# Patient Record
Sex: Female | Born: 1935 | Race: White | Hispanic: No | Marital: Married | Smoking: Never smoker
Health system: Southern US, Community
[De-identification: ages and names within clinical notes are randomized; demographics above are authoritative.]

## PROBLEM LIST (undated history)

## (undated) DIAGNOSIS — N159 Renal tubulo-interstitial disease, unspecified: Secondary | ICD-10-CM

## (undated) DIAGNOSIS — H409 Unspecified glaucoma: Secondary | ICD-10-CM

## (undated) DIAGNOSIS — K5731 Diverticulosis of large intestine without perforation or abscess with bleeding: Secondary | ICD-10-CM

## (undated) DIAGNOSIS — K922 Gastrointestinal hemorrhage, unspecified: Secondary | ICD-10-CM

## (undated) DIAGNOSIS — H579 Unspecified disorder of eye and adnexa: Secondary | ICD-10-CM

## (undated) DIAGNOSIS — K219 Gastro-esophageal reflux disease without esophagitis: Secondary | ICD-10-CM

## (undated) DIAGNOSIS — I10 Essential (primary) hypertension: Secondary | ICD-10-CM

## (undated) DIAGNOSIS — F419 Anxiety disorder, unspecified: Secondary | ICD-10-CM

## (undated) HISTORY — PX: CATARACT EXTRACTION: SUR2

## (undated) HISTORY — PX: DILATION AND CURETTAGE OF UTERUS: SHX78

## (undated) HISTORY — PX: FOOT SURGERY: SHX648

## (undated) HISTORY — PX: EYE SURGERY: SHX253

## (undated) HISTORY — PX: OTHER SURGICAL HISTORY: SHX169

## (undated) HISTORY — DX: Diverticulosis of large intestine without perforation or abscess with bleeding: K57.31

## (undated) HISTORY — PX: TONSILLECTOMY: SUR1361

## (undated) HISTORY — PX: THYROID SURGERY: SHX805

## (undated) HISTORY — PX: HERNIA REPAIR: SHX51

---

## 2000-02-20 ENCOUNTER — Encounter: Admission: RE | Admit: 2000-02-20 | Discharge: 2000-02-20 | Payer: Self-pay | Admitting: Pulmonary Disease

## 2001-02-24 ENCOUNTER — Ambulatory Visit (HOSPITAL_COMMUNITY): Admission: RE | Admit: 2001-02-24 | Discharge: 2001-02-24 | Payer: Self-pay | Admitting: Pulmonary Disease

## 2001-03-05 ENCOUNTER — Ambulatory Visit (HOSPITAL_COMMUNITY): Admission: RE | Admit: 2001-03-05 | Discharge: 2001-03-05 | Payer: Self-pay | Admitting: Pulmonary Disease

## 2001-03-11 ENCOUNTER — Ambulatory Visit (HOSPITAL_COMMUNITY): Admission: RE | Admit: 2001-03-11 | Discharge: 2001-03-11 | Payer: Self-pay | Admitting: Ophthalmology

## 2001-03-11 ENCOUNTER — Observation Stay (HOSPITAL_COMMUNITY): Admission: EM | Admit: 2001-03-11 | Discharge: 2001-03-12 | Payer: Self-pay

## 2001-08-11 ENCOUNTER — Encounter: Payer: Self-pay | Admitting: Emergency Medicine

## 2001-08-11 ENCOUNTER — Emergency Department (HOSPITAL_COMMUNITY): Admission: EM | Admit: 2001-08-11 | Discharge: 2001-08-11 | Payer: Self-pay | Admitting: Emergency Medicine

## 2001-10-25 ENCOUNTER — Ambulatory Visit (HOSPITAL_COMMUNITY): Admission: RE | Admit: 2001-10-25 | Discharge: 2001-10-25 | Payer: Self-pay | Admitting: Pulmonary Disease

## 2001-11-27 ENCOUNTER — Emergency Department (HOSPITAL_COMMUNITY): Admission: EM | Admit: 2001-11-27 | Discharge: 2001-11-27 | Payer: Self-pay | Admitting: Internal Medicine

## 2002-02-25 ENCOUNTER — Ambulatory Visit (HOSPITAL_COMMUNITY): Admission: RE | Admit: 2002-02-25 | Discharge: 2002-02-25 | Payer: Self-pay | Admitting: Pulmonary Disease

## 2002-03-03 ENCOUNTER — Ambulatory Visit (HOSPITAL_COMMUNITY): Admission: RE | Admit: 2002-03-03 | Discharge: 2002-03-03 | Payer: Self-pay | Admitting: Pulmonary Disease

## 2002-05-30 ENCOUNTER — Other Ambulatory Visit: Admission: RE | Admit: 2002-05-30 | Discharge: 2002-05-30 | Payer: Self-pay | Admitting: Dermatology

## 2002-06-07 ENCOUNTER — Encounter: Admission: RE | Admit: 2002-06-07 | Discharge: 2002-06-07 | Payer: Self-pay | Admitting: Pulmonary Disease

## 2002-12-22 ENCOUNTER — Other Ambulatory Visit: Admission: RE | Admit: 2002-12-22 | Discharge: 2002-12-22 | Payer: Self-pay | Admitting: Dermatology

## 2002-12-27 ENCOUNTER — Encounter (HOSPITAL_COMMUNITY): Admission: RE | Admit: 2002-12-27 | Discharge: 2003-01-26 | Payer: Self-pay | Admitting: Orthopaedic Surgery

## 2003-06-20 ENCOUNTER — Ambulatory Visit (HOSPITAL_COMMUNITY): Admission: RE | Admit: 2003-06-20 | Discharge: 2003-06-20 | Payer: Self-pay | Admitting: Pulmonary Disease

## 2004-04-09 ENCOUNTER — Encounter: Admission: RE | Admit: 2004-04-09 | Discharge: 2004-04-09 | Payer: Self-pay | Admitting: Pulmonary Disease

## 2004-06-25 ENCOUNTER — Ambulatory Visit (HOSPITAL_COMMUNITY): Admission: RE | Admit: 2004-06-25 | Discharge: 2004-06-25 | Payer: Self-pay | Admitting: Pulmonary Disease

## 2004-07-22 ENCOUNTER — Other Ambulatory Visit: Admission: RE | Admit: 2004-07-22 | Discharge: 2004-07-22 | Payer: Self-pay | Admitting: Dermatology

## 2004-11-27 ENCOUNTER — Ambulatory Visit (HOSPITAL_COMMUNITY): Admission: RE | Admit: 2004-11-27 | Discharge: 2004-11-27 | Payer: Self-pay | Admitting: Pulmonary Disease

## 2005-05-30 ENCOUNTER — Ambulatory Visit: Payer: Self-pay | Admitting: Internal Medicine

## 2005-06-03 ENCOUNTER — Encounter (INDEPENDENT_AMBULATORY_CARE_PROVIDER_SITE_OTHER): Payer: Self-pay | Admitting: Internal Medicine

## 2005-06-03 ENCOUNTER — Ambulatory Visit: Payer: Self-pay | Admitting: Internal Medicine

## 2005-06-03 ENCOUNTER — Ambulatory Visit (HOSPITAL_COMMUNITY): Admission: RE | Admit: 2005-06-03 | Discharge: 2005-06-03 | Payer: Self-pay | Admitting: Internal Medicine

## 2005-06-19 ENCOUNTER — Ambulatory Visit: Payer: Self-pay | Admitting: Internal Medicine

## 2005-06-23 ENCOUNTER — Ambulatory Visit (HOSPITAL_COMMUNITY): Admission: RE | Admit: 2005-06-23 | Discharge: 2005-06-23 | Payer: Self-pay | Admitting: Internal Medicine

## 2005-08-11 ENCOUNTER — Ambulatory Visit: Payer: Self-pay | Admitting: Internal Medicine

## 2005-09-03 ENCOUNTER — Ambulatory Visit: Payer: Self-pay | Admitting: Internal Medicine

## 2005-10-30 ENCOUNTER — Ambulatory Visit (HOSPITAL_COMMUNITY): Admission: RE | Admit: 2005-10-30 | Discharge: 2005-10-30 | Payer: Self-pay | Admitting: Pulmonary Disease

## 2005-12-11 ENCOUNTER — Ambulatory Visit: Payer: Self-pay | Admitting: Internal Medicine

## 2006-11-05 ENCOUNTER — Ambulatory Visit (HOSPITAL_COMMUNITY): Admission: RE | Admit: 2006-11-05 | Discharge: 2006-11-05 | Payer: Self-pay | Admitting: Pulmonary Disease

## 2006-12-21 ENCOUNTER — Ambulatory Visit (HOSPITAL_COMMUNITY): Admission: RE | Admit: 2006-12-21 | Discharge: 2006-12-21 | Payer: Self-pay | Admitting: Pulmonary Disease

## 2007-09-28 ENCOUNTER — Ambulatory Visit (HOSPITAL_COMMUNITY): Admission: RE | Admit: 2007-09-28 | Discharge: 2007-09-28 | Payer: Self-pay | Admitting: Pulmonary Disease

## 2007-10-12 ENCOUNTER — Encounter (HOSPITAL_COMMUNITY): Admission: RE | Admit: 2007-10-12 | Discharge: 2007-11-11 | Payer: Self-pay | Admitting: Pulmonary Disease

## 2007-11-08 ENCOUNTER — Inpatient Hospital Stay (HOSPITAL_COMMUNITY): Admission: EM | Admit: 2007-11-08 | Discharge: 2007-11-12 | Payer: Self-pay | Admitting: Emergency Medicine

## 2007-12-21 ENCOUNTER — Ambulatory Visit (HOSPITAL_COMMUNITY): Admission: RE | Admit: 2007-12-21 | Discharge: 2007-12-21 | Payer: Self-pay | Admitting: Pulmonary Disease

## 2008-06-25 ENCOUNTER — Emergency Department (HOSPITAL_COMMUNITY): Admission: EM | Admit: 2008-06-25 | Discharge: 2008-06-25 | Payer: Self-pay | Admitting: Emergency Medicine

## 2008-07-04 ENCOUNTER — Ambulatory Visit (HOSPITAL_COMMUNITY): Admission: RE | Admit: 2008-07-04 | Discharge: 2008-07-04 | Payer: Self-pay | Admitting: Pulmonary Disease

## 2009-02-05 ENCOUNTER — Ambulatory Visit (HOSPITAL_COMMUNITY): Admission: RE | Admit: 2009-02-05 | Discharge: 2009-02-05 | Payer: Self-pay | Admitting: Pulmonary Disease

## 2009-08-04 ENCOUNTER — Emergency Department (HOSPITAL_COMMUNITY): Admission: EM | Admit: 2009-08-04 | Discharge: 2009-08-04 | Payer: Self-pay | Admitting: Emergency Medicine

## 2010-01-20 IMAGING — US US RENAL
1 series · 14 of 25 positions shown · non-contrast
Comparison: 12/21/06 study.

CLINICAL DATA: Pyelonephritis. Abdominal pain.
 RENAL/URINARY TRACT ULTRASOUND:
TECHNIQUE: Complete ultrasound examination of the urinary tract was performed including evaluation of the kidneys, renal collecting systems, and urinary bladder.

[Series 1: unknown · 0.27mm/px · 14 of 32 slices shown]
[im 1/32]
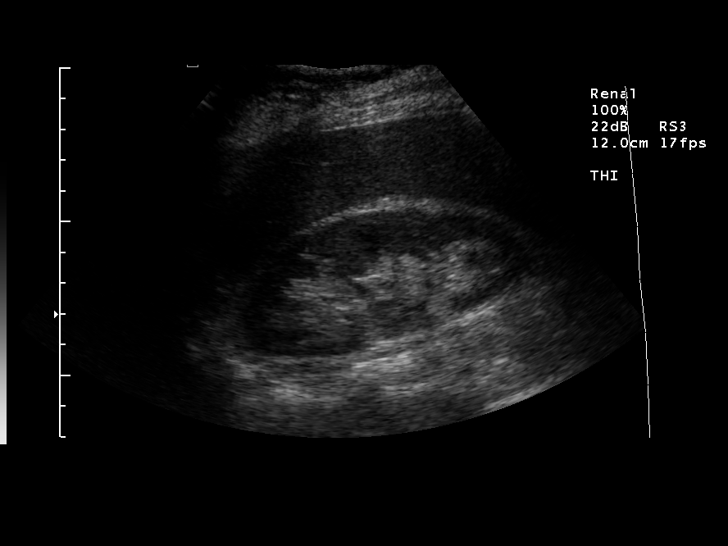
[im 3/32]
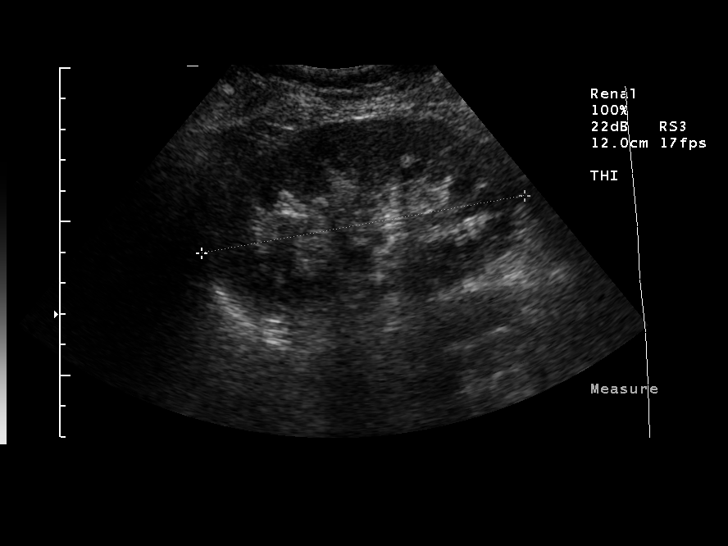
[im 6/32]
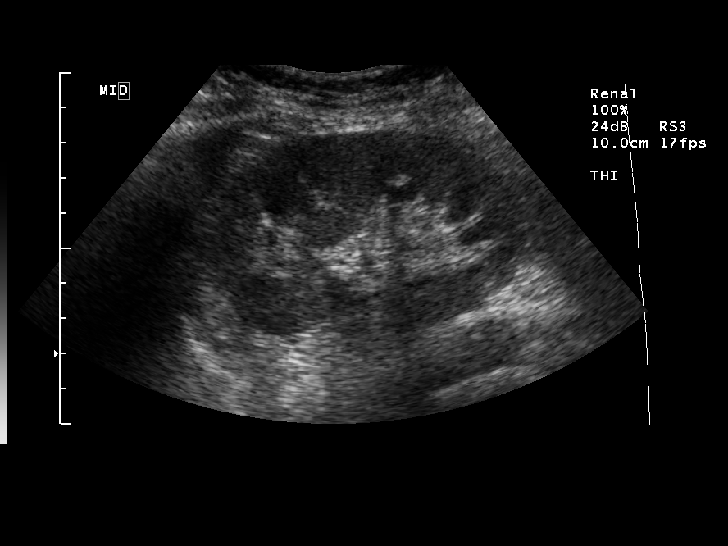
[im 8/32]
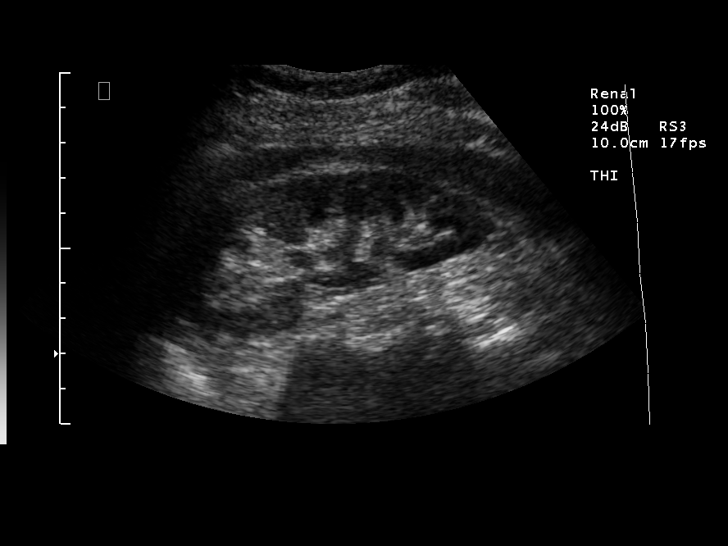
[im 11/32]
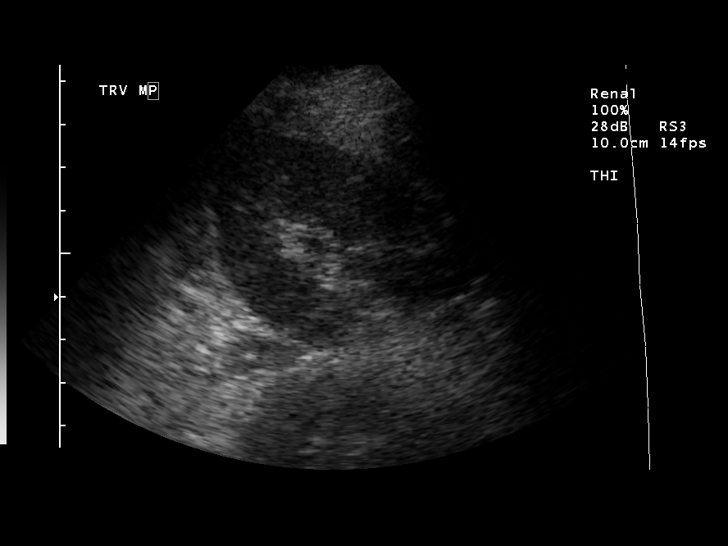
[im 12/32]
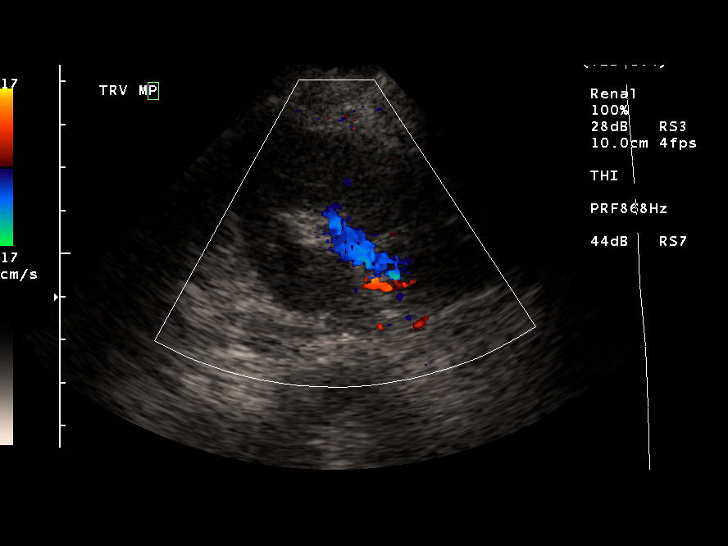
[im 15/32]
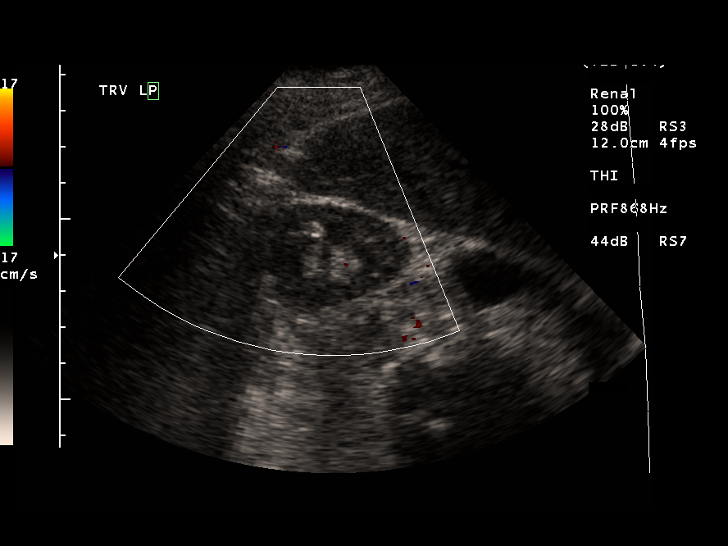
[im 17/32]
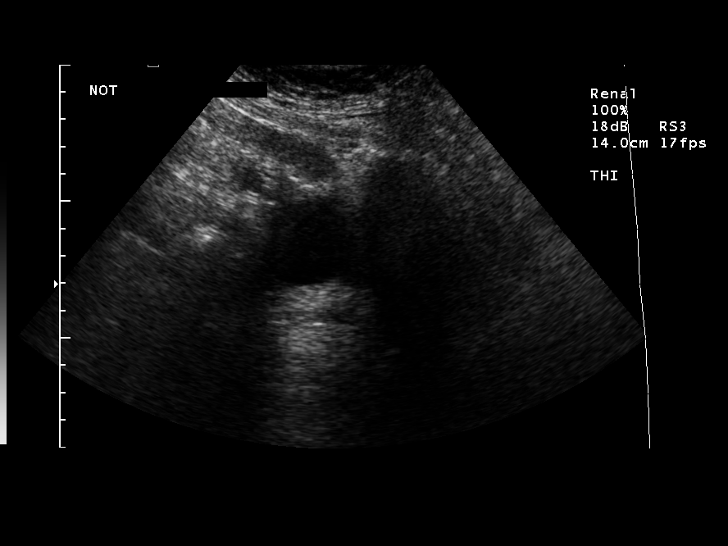
[im 20/32]
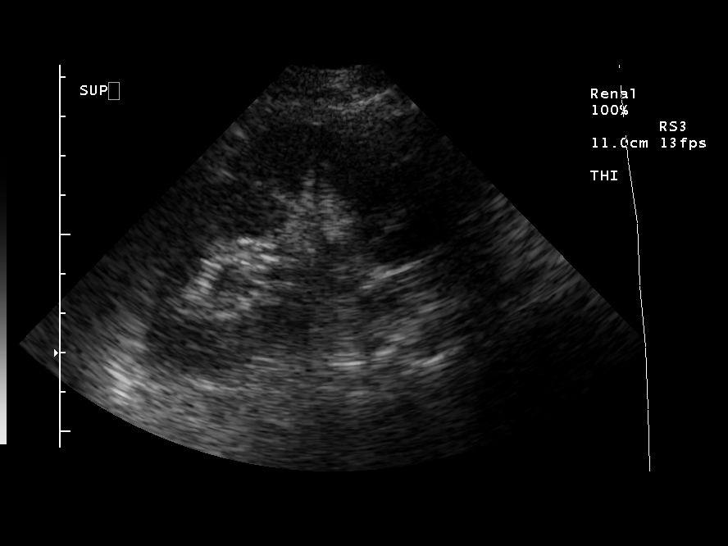
[im 21/32]
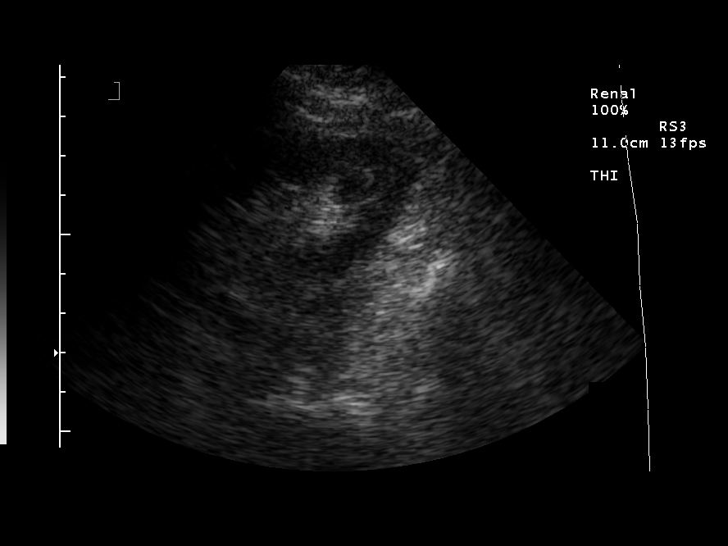
[im 24/32]
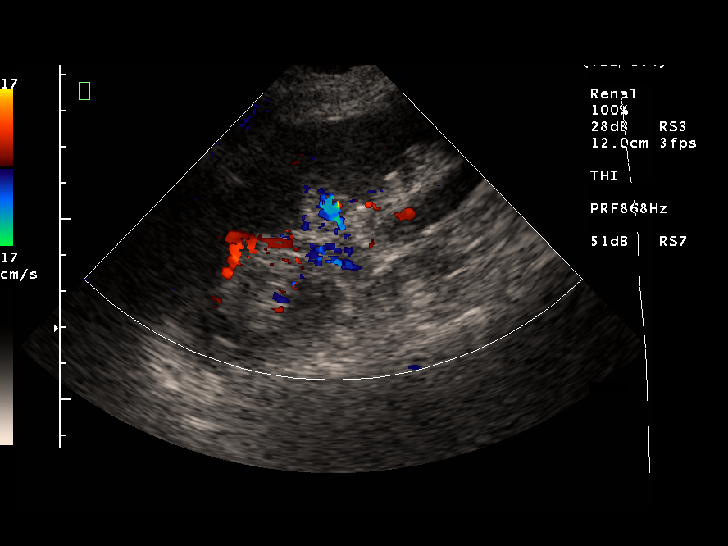
[im 26/32]
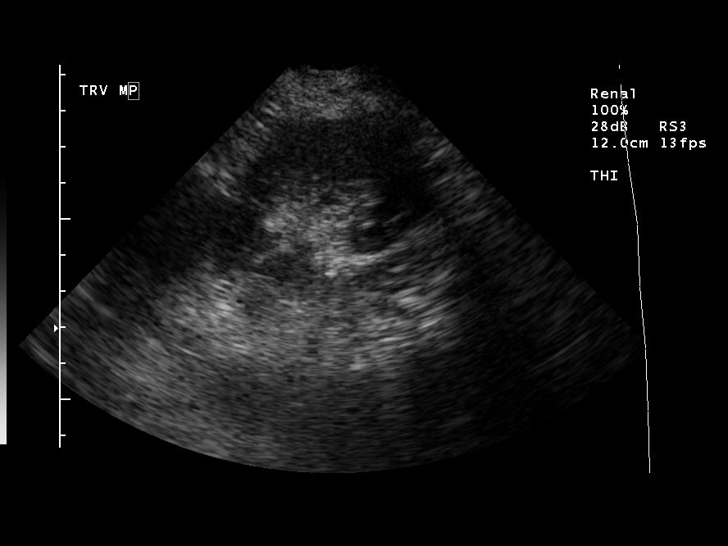
[im 29/32]
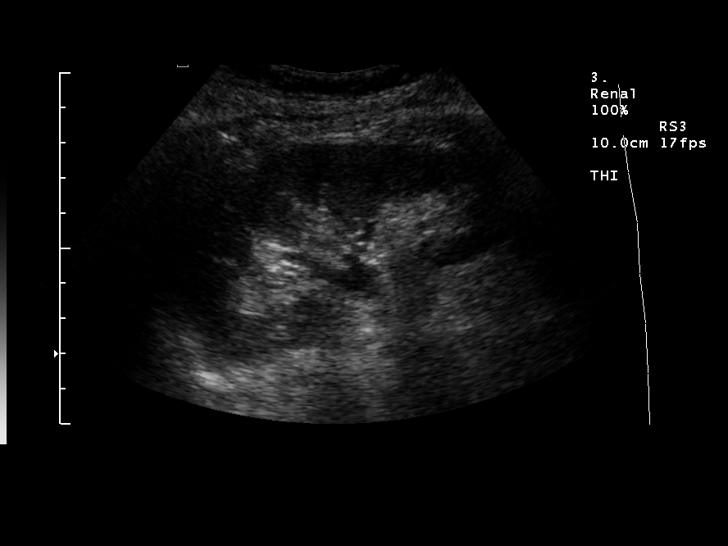
[im 32/32]
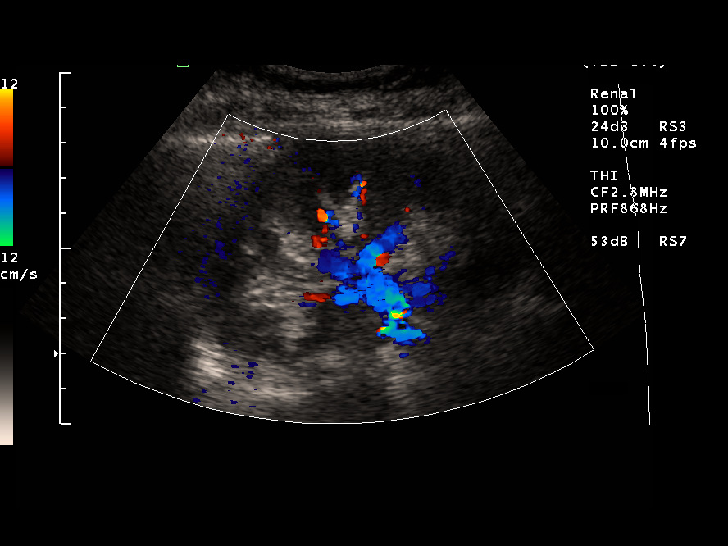

[14 of 25 positions shown; findings below may reference images not displayed]

FINDINGS: The kidneys bilaterally are normal in echogenicity and size with the right kidney measuring 10.7 cm and the left kidney measuring 11.4 cm in greatest longitudinal dimension. There is no evidence of hydronephrosis, solid renal masses, definite renal calculi, or focal parenchymal abnormalities. The bladder is normal for the degree of filling.
IMPRESSION: Unremarkable examination.

## 2010-02-26 ENCOUNTER — Ambulatory Visit (HOSPITAL_COMMUNITY): Admission: RE | Admit: 2010-02-26 | Discharge: 2010-02-26 | Payer: Self-pay | Admitting: Pulmonary Disease

## 2010-05-27 ENCOUNTER — Ambulatory Visit (HOSPITAL_COMMUNITY): Admission: RE | Admit: 2010-05-27 | Discharge: 2010-05-27 | Payer: Self-pay | Admitting: Pulmonary Disease

## 2010-05-30 ENCOUNTER — Ambulatory Visit (HOSPITAL_COMMUNITY): Admission: RE | Admit: 2010-05-30 | Discharge: 2010-05-30 | Payer: Self-pay | Admitting: Pulmonary Disease

## 2010-07-13 ENCOUNTER — Emergency Department (HOSPITAL_COMMUNITY)
Admission: EM | Admit: 2010-07-13 | Discharge: 2010-07-13 | Payer: Self-pay | Source: Home / Self Care | Admitting: Emergency Medicine

## 2010-08-22 ENCOUNTER — Ambulatory Visit (HOSPITAL_COMMUNITY)
Admission: RE | Admit: 2010-08-22 | Discharge: 2010-08-22 | Payer: Self-pay | Source: Home / Self Care | Attending: Pulmonary Disease | Admitting: Pulmonary Disease

## 2010-09-22 ENCOUNTER — Encounter: Payer: Self-pay | Admitting: Pulmonary Disease

## 2010-10-22 ENCOUNTER — Ambulatory Visit (INDEPENDENT_AMBULATORY_CARE_PROVIDER_SITE_OTHER): Payer: Medicare Other | Admitting: Urology

## 2010-10-22 DIAGNOSIS — N816 Rectocele: Secondary | ICD-10-CM

## 2010-10-22 DIAGNOSIS — N3941 Urge incontinence: Secondary | ICD-10-CM

## 2010-10-22 DIAGNOSIS — R3915 Urgency of urination: Secondary | ICD-10-CM

## 2010-11-12 LAB — POCT CARDIAC MARKERS
CKMB, poc: 1.1 ng/mL (ref 1.0–8.0)
Myoglobin, poc: 47.6 ng/mL (ref 12–200)

## 2010-11-12 LAB — DIFFERENTIAL
Basophils Absolute: 0.1 10*3/uL (ref 0.0–0.1)
Basophils Relative: 1 % (ref 0–1)
Eosinophils Relative: 2 % (ref 0–5)
Lymphocytes Relative: 23 % (ref 12–46)
Lymphs Abs: 2.2 10*3/uL (ref 0.7–4.0)
Monocytes Absolute: 1 10*3/uL (ref 0.1–1.0)

## 2010-11-12 LAB — CBC
MCH: 32.2 pg (ref 26.0–34.0)
MCV: 97.6 fL (ref 78.0–100.0)
RDW: 13.9 % (ref 11.5–15.5)

## 2010-11-12 LAB — COMPREHENSIVE METABOLIC PANEL
ALT: 21 U/L (ref 0–35)
AST: 29 U/L (ref 0–37)
Albumin: 4.1 g/dL (ref 3.5–5.2)
Calcium: 9.6 mg/dL (ref 8.4–10.5)
Chloride: 98 mEq/L (ref 96–112)
Creatinine, Ser: 1.07 mg/dL (ref 0.4–1.2)
Glucose, Bld: 109 mg/dL — ABNORMAL HIGH (ref 70–99)
Potassium: 3.3 mEq/L — ABNORMAL LOW (ref 3.5–5.1)
Sodium: 136 mEq/L (ref 135–145)

## 2010-11-12 LAB — URINALYSIS, ROUTINE W REFLEX MICROSCOPIC
Ketones, ur: NEGATIVE mg/dL
Protein, ur: NEGATIVE mg/dL

## 2010-11-12 LAB — URINE MICROSCOPIC-ADD ON

## 2010-12-03 LAB — DIFFERENTIAL
Basophils Absolute: 0 10*3/uL (ref 0.0–0.1)
Eosinophils Relative: 2 % (ref 0–5)
Lymphocytes Relative: 23 % (ref 12–46)
Neutro Abs: 4.9 10*3/uL (ref 1.7–7.7)
Neutrophils Relative %: 67 % (ref 43–77)

## 2010-12-03 LAB — CBC
Platelets: 224 10*3/uL (ref 150–400)
RDW: 14 % (ref 11.5–15.5)

## 2010-12-03 LAB — POCT CARDIAC MARKERS
CKMB, poc: <1 ng/mL — ABNORMAL LOW (ref 1.0–8.0)
Myoglobin, poc: 74.1 ng/mL (ref 12–200)
Troponin i, poc: <0.05 ng/mL (ref 0.00–0.09)
Troponin i, poc: <0.05 ng/mL (ref 0.00–0.09)

## 2010-12-03 LAB — BASIC METABOLIC PANEL
BUN: 19 mg/dL (ref 6–23)
Calcium: 9.7 mg/dL (ref 8.4–10.5)
GFR calc non Af Amer: >60 mL/min (ref >60)
Glucose, Bld: 129 mg/dL — ABNORMAL HIGH (ref 70–99)

## 2011-01-14 NOTE — H&P (Signed)
NAME:  Felicia Wells, Felicia Wells              ACCOUNT NO.:  [id number]   MEDICAL RECORD NO.:  [id number]          PATIENT TYPE:  INP   LOCATION:  A304                          FACILITY:  [facility]   PHYSICIAN:  Tesfaye D. Felecia Shelling, MD   DATE OF BIRTH:  Jun 12, 1919   DATE OF ADMISSION:  11/08/2007  DATE OF DISCHARGE:  LH                              HISTORY & PHYSICAL   CHIEF COMPLAINT:  Chills and abdominal pain.   HISTORY OF PRESENT ILLNESS:  This is an 77 year old female patient of  Dr.  Kari Baars who was brought to emergency room due to recurrent  chills, abdominal pain, dysuria and urinary incontinence.  The patient  was in her usual state of health until last few days when she started  having urinary incontinence and dysuria.  This was followed with the  abdominal pain and chills.  However, she did not have any fever.  The  patient had a similar episode in the past when she had pyelonephritis.  She was brought to the emergency room where she was evaluated and found  to have leukocytosis and abnormal urinalysis compatible with urinary  tract infection.  The patient was started on IV antibiotics and admitted  for further treatment.   REVIEW OF SYSTEMS:  No headache, chest pain, shortness of breath, cough,  palpitation, nausea, vomiting or leg edema.   PAST MEDICAL HISTORY:  1. History of pyelonephritis in the past.  2. Osteopenia.  3. Degenerative joint disease.  4. Meniere's disease.   CURRENT MEDICATIONS:  1. Evista 60 mg p.o. daily.  2. Aspirin 81 mg daily.  3. Hydrochlorothiazide 25 mg daily.  4. Multivitamin 1 tablet p.o. daily.  5. Calcium daily.  6. Vitamin D daily.  7. Vitamin B complex daily.  8. Fish oil 2 tablets daily.   SOCIAL HISTORY:  The patient is married.  She lives with her husband.  No history of alcohol, tobacco or substance abuse.   PHYSICAL EXAMINATION:  The patient is alert awake and acutely sick  looking with vitals: Blood pressure 123/70, pulse 90,  respiratory rate  18, temperature 99 degrees Fahrenheit.  HEENT:  Pupils are equal and reactive.  NECK:  Is supple.  CHEST:  Clear lung fields, good air entry.  CARDIOVASCULAR SYSTEM:  First and second heart sound heard.  No murmur,  no gallop.  ABDOMEN:  Is soft and lax.  Bowel sounds positive.  No mass or  organomegaly.  EXTREMITIES:  No leg edema.   LABORATORY DATA ON ADMISSION:  Sodium 130, potassium 2.9, chloride 94,  carbon dioxide 23, glucose 153, BUN 11, creatinine 0.7, bilirubin 1.1,  alkaline phosphatase 47, AST 23, ALT 11, total protein 6.2, albumin 3.2,  calcium 8.4.  CBC:  WBC 15.7, hemoglobin 13.1, hematocrit 37.5, platelet  195.  Urinalysis with specific gravity of 1.010, pH of 5.5, protein 30,  nitrite positive, leukocytes moderate, and RBCs 3 to 6, bacteria many.   ASSESSMENT:  1. Probably pyelonephritis.  2. Hypokalemia.  3. History of Meniere's disease.  4. Osteopenia.  5. Degenerative joint disease.   PLAN:  Will continue the  patient on IV Cipro 400 mg IV piggy back daily.  Will supplement potassium.  Will do septic workup.  Will continue to  monitor her electrolytes.  Will continue regular medications.      Tesfaye D. Felecia Shelling, MD  Electronically Signed     TDF/MEDQ  D:  11/08/2007  T:  11/09/2007  Job:  [id number]

## 2011-01-14 NOTE — Group Therapy Note (Signed)
NAME:  MARUA, QIN              ACCOUNT NO.:  [id number]   MEDICAL RECORD NO.:  [id number]          PATIENT TYPE:  INP   LOCATION:  A304                          FACILITY:  [facility]   PHYSICIAN:  Edward L. Juanetta Gosling, M.D.DATE OF BIRTH:  07/17/1919   DATE OF PROCEDURE:  DATE OF DISCHARGE:  11/12/2007                                 PROGRESS NOTE   REVIEW OF SYSTEMS:  GENERAL:  Ms. Dike seems to be doing better.  She  is still weak but improving.  She is still having some episodes of being  sweaty.  VITALS:  Her T-max last night was 99.3.  She is now 98.2, pulse  78, respirations 20, blood pressure 122/72.  CHEST:  Clear although she says she has been coughing some.  HEART:  Regular.  ABDOMEN:  Soft.  EXTREMITIES:  Showed no edema.  CENTRAL NERVOUS SYSTEM:  Exam grossly intact.   LABORATORY DATA:  Her blood cultures are negative.  Urine culture is  growing E-coli which is pansensitive.   ASSESSMENT:  She is better.   PLAN:  Continue with treatments, medications and try to get her up some  more, hopefully moving around some more and she will perhaps be able to  go home fairly soon.      Edward L. Juanetta Gosling, M.D.  Electronically Signed     ELH/MEDQ  D:  11/11/2007  T:  11/13/2007  Job:  [id number]

## 2011-01-14 NOTE — Group Therapy Note (Signed)
NAME:  Felicia, Wells              ACCOUNT NO.:  [id number]   MEDICAL RECORD NO.:  [id number]          PATIENT TYPE:  INP   LOCATION:  A304                          FACILITY:  [facility]   PHYSICIAN:  Edward L. Juanetta Gosling, M.D.DATE OF BIRTH:  26-Oct-1918   DATE OF PROCEDURE:  DATE OF DISCHARGE:                                 PROGRESS NOTE   Felicia Wells is better, but says she feels significantly weaker this  morning than she did earlier.  She is otherwise doing okay.  She had  some sweating during the night.  T-max was 99.2.  She now has a  temperature of 98, pulse 75, respirations 16, blood pressure 128/68. Her  chest is clearer.  Her heart is regular.  Her abdomen soft.  White blood  count is now down to 11,400, so she is much improved.  She says that she  was concerned that she might have something like peritonitis because she  had that 60 years ago when one of her children was born and it felt kind  of like she does now, but she has absolutely no abnormalities on her  abdominal examination, so I do not think that is the situation, but I am  going to plan to have her continue her medications and treatments and I  will see her back and I will reevaluate in the morning. She may be able  to go home tomorrow or Friday the 13.      Edward L. Juanetta Gosling, M.D.  Electronically Signed     ELH/MEDQ  D:  11/10/2007  T:  11/12/2007  Job:  [id number]

## 2011-01-14 NOTE — Discharge Summary (Signed)
NAME:  Felicia Wells, Felicia Wells              ACCOUNT NO.:  [id number]   MEDICAL RECORD NO.:  [id number]          PATIENT TYPE:  INP   LOCATION:  A304                          FACILITY:  [facility]   PHYSICIAN:  Edward L. Juanetta Gosling, M.D.DATE OF BIRTH:  07/26/1919   DATE OF ADMISSION:  11/08/2007  DATE OF DISCHARGE:  03/13/2009LH                               DISCHARGE SUMMARY   FINAL DISCHARGE DIAGNOSES:  1. Pyelonephritis from Escherichia coli.  2. History of Meniere disease.  3. Osteopenia.  4. Degenerative joint disease.  5. Superficial phlebitis of the left antecubital area.  6. Mild hyponatremia.  7. Hypokalemia.   HISTORY:  Felicia Wells is an 77 year old who came to the emergency room  with chills, abdominal pain, dysuria, and urinary incontinence.  She  said that a few days ago, she started having cough, congestion, fever,  chills, and did not feel well.  She felt like she did when she had  pyelonephritis in the past.  She has had leukocytosis.  When seen in the  emergency room, she had some flank tenderness.   PHYSICAL EXAMINATION:  GENERAL:  Showed an elderly frail-appearing  female who is acutely ill.  VITAL SIGNS:  Blood pressure 123/70, pulse 90, respirations 18, and  temperature was 99.  CHEST:  Clear.  HEART:  Regular.  ABDOMEN:  She did have some flank tenderness.   LABORATORY DATA:  Her sodium was 130 and potassium 2.9.   HOSPITAL COURSE:  She was treated with Cipro intravenously and improved.  She did grow E. coli which was pansensitive.  She developed a  superficial phlebitis of the left antecubital fossa where she had had an  IV and she is going to treat that with warm compresses at home 3 times a  day.   DISCHARGE MEDICATIONS:  She is going to be discharged home on:  1. Cipro 250 mg b.i.d. x7 days.  2. HCTZ 25 mg daily.  3. Evista 60 mg daily.  4. Aspirin 81 mg daily.  5. Multiple vitamin daily.  6. Calcium 600 mg daily.  7. Ocuvite on a daily basis.  8. Vitamin D  daily.  9. B complex and fish oil that she takes over the counter daily.   FOLLOWUP:  She will be followed by home health services.  She is going  to have check on her antecubital fossa.  She is going to have followup  of her ambulation, her urination, etc.      Edward L. Juanetta Gosling, M.D.  Electronically Signed     ELH/MEDQ  D:  11/12/2007  T:  11/13/2007  Job:  [id number]

## 2011-01-14 NOTE — Group Therapy Note (Signed)
NAME:  JERNIE, SCHUTT              ACCOUNT NO.:  [id number]   MEDICAL RECORD NO.:  [id number]          PATIENT TYPE:  INP   LOCATION:  A304                          FACILITY:  [facility]   PHYSICIAN:  Edward L. Juanetta Gosling, M.D.DATE OF BIRTH:  12-Jan-1919   DATE OF PROCEDURE:  DATE OF DISCHARGE:                                 PROGRESS NOTE   PROBLEM:  Likely pyelonephritis.   SUBJECTIVE:  Ms. Qadir says she is feeling better.  She has no new  complaints.   PHYSICAL EXAMINATION:  Shows she is awake and alert.  Temperature 98.3, pulse 87, respirations 20, blood pressure 116/59,  temperature 100.8.  Her chest is clear.   ASSESSMENT:  She has got what is probably pyelonephritis.  She has  chronic Meniere's syndrome.  She has had an elevated white blood cell  count.  She was hypokalemic when she came into the hospital.   PLAN:  To go ahead and get physical therapy to see her.  She was working  with physical therapy at home before she was admitted.  Her potassium  has been replaced and is now normal.  I am going to go ahead and get a  renal ultrasound to make sure there were not missing some anatomical  renal deficit that has caused her to have recurrent urinary tract  infection, and I am going to hold her hydrochlorothiazide for the  moment.  She is on that for the Meniere's syndrome.      Edward L. Juanetta Gosling, M.D.  Electronically Signed     ELH/MEDQ  D:  11/09/2007  T:  11/09/2007  Job:  [id number]

## 2011-01-14 NOTE — Group Therapy Note (Signed)
NAME:  Felicia Wells, Felicia Wells              ACCOUNT NO.:  [id number]   MEDICAL RECORD NO.:  [id number]          PATIENT TYPE:  INP   LOCATION:  A304                          FACILITY:  [facility]   PHYSICIAN:  Edward L. Juanetta Gosling, M.D.DATE OF BIRTH:  [birth year]   DATE OF PROCEDURE:  DATE OF DISCHARGE:  11/12/2007                                 PROGRESS NOTE   [name] says that she is feeling okay and has no new complaints.  She  has an area in her left olecranon area where she has had her IV and it  is a little bit tender so I think she may have a superficial phlebitis.  I have asked that he use some warm compresses on it at home.  Otherwise  she is doing well.   PHYSICAL EXAMINATION:  VITALS:  Her T-max is 99 degrees.  CHEST:  Her chest is clear.   She looks comfortable.  She is up and moving and her IV is actually out.  I am going to plan to go ahead and discharge her today.  She will need  some home health services.  Please see discharge summary for details.      Edward L. Juanetta Gosling, M.D.  Electronically Signed     ELH/MEDQ  D:  11/12/2007  T:  11/13/2007  Job:  [id number]

## 2011-01-17 NOTE — Discharge Summary (Signed)
[place]. [facility]  Patient:    Felicia Wells, Felicia Wells                     MRN: [id number] Proc. Date: 03/12/01 Adm. Date:  [id number] Disc. Date: [id number] Attending:  Tommy Medal                           Discharge Summary  ADMISSION DIAGNOSIS:  Hemorrhage of the right upper eyelid combined with severe hypertension.  DISCHARGE DIAGNOSIS:  Hemorrhage of the right upper eyelid combined with severe hypertension.  HISTORY OF PRESENT ILLNESS:  This lady had upper eyelid blepharoplasty surgery on the day of admission and several hours later had a hemorrhage in the right upper eyelid at the site of the surgery.  HOSPITAL COURSE:  Since the hemorrhage had not stopped, she was admitted for observation for 24 hours, both to observe the eye and to be able to handle the bleeding should it continue or should it become worse.  She did well.  The bleeding stopped, and the eyelid became quite soft and was discolored with blood but could be opened, and the eye could see normally and looked normal. Since she did well, she was discharged the day after admission.  CONDITION UPON DISCHARGE:  Good.  DISCHARGE PLAN:  Patient to use warm compresses on the right side of the face several times daily.  She is to call Dr. Lucious Groves office to be seen in three or four days to have the sutures removed from the blepharoplasty.  DISCHARGE MEDICATIONS:  None. DD:  03/12/01 TD:  03/13/01 Job: [id number]

## 2011-01-17 NOTE — H&P (Signed)
[place]. [facility]  Patient:    Felicia Wells, Felicia Wells                     MRN: [id number] Adm. Date:  [id number] Disc. Date: [id number] Attending:  Tommy Medal                         History and Physical  HISTORY OF PRESENT ILLNESS:  This 77 year old lady was seen in my office on February 18, 2001 for a complete eye examination and to discuss upper eyelid optical blepharoplasties.  Pressures were ______ in each eye and she could be refracted to around 20/25 in the right and 20/20 left.  The pupils, motility, conjunctiva, cornea, anterior chamber, and dilated fundus exam were unremarkable except for some drusen in each retina and she did have definite cataracts in each eye.  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 She reported that she had great difficulty with the skin and actually had to pull the skin up physically with her fingers some times in order to see better.  This bothered her and she could feel the weight of the skin.  This was discussed with her and the procedure was discussed, and she decided to have upper eyelid blepharoplasties in order to improve this condition. Medically, she did report multiple allergies; however, there was no history of significant past disease.  She reported no cardiac nor respiratory disease and her review of systems was generally negative except she did have some dizziness occasionally and some allergic symptoms.  She also reported some arthritis, but otherwise the review of systems was negative.  She decided to have upper eyelid optical blepharoplasties in order to improve her vision.  She went to the minor surgery room at [facility] at [facility] on March 11, 2001 and had uncomplicated upper eyelid optical blepharoplasties.  There was some bleeding but it was not particularly significant.  A battery operated cautery was  used on the left side but not on the right.  She left the minor surgery room with each eye patched, having done well.  She contacted my office several hours later and reported that there was some bleeding from both sides but the right was worse than the left and she was told to come to my office.  When she was seen, examination revealed a significant hematoma involving primarily the right upper eyelid.  It was difficult to open the eyelids; however, the eye itself could be seen and there was good motility and the pupil reacted, and the patient could see with the eye.  There was no hemorrhage subconjunctivally and no apparent orbital hemorrhage.  She was watched in my office for approximately 90 minutes and the condition did not worsen, but she had been somewhat nauseated and there was slight continued oozing from the wound on the right.  For that reason, it has been decided she should be admitted for 24-hour observation to [facility].  She was sent to the emergency room, and in the emergency room admission has been arranged.  General physical exam is basically unremarkable except for the large hematoma involving the upper eyelid.  Also, the blood pressure was quite high, approximately 165/115 at admission.  Please also see the complete physical examination since she was seen by internal medicine in consultation for her blood pressure and for an examination.  The patient is to be observed until the bleeding has stopped and it is certain that the eye is doing well on the right.  It is possible that exploration of the wound will be required to stop the bleeding, but it is hoped this will not be necessary.  ADMITTING DIAGNOSIS:  Hemorrhage of the right upper eyelid at the site of a blepharoplasty.  PLAN:  Observation of the patient until the hemorrhage is stabilized. DD:  03/12/01 TD:  03/13/01 Job: [id number]

## 2011-01-17 NOTE — Group Therapy Note (Signed)
[facility]  Patient:    Felicia Wells, Felicia Wells Visit Number: [id number] MRN: [id number]          Service Type: EMS Location: ED Attending Physician:  Carylon Perches Dictated by:   Carylon Perches, M.D. Admit Date:  11/27/2001 Discharge Date: 11/27/2001   CC:         Kari Baars, M.D.   Progress Note  SUBJECTIVE:  This patient complains of a cough with brown sputum production following having the vents in her house cleaned early last week.  She has not experienced a fever.  She has no underlying history of asthma, emphysema, or pulmonary fibrosis.  She has taken [employer]. without relief.  She is a nonsmoker.  OBJECTIVE:  Temperature 98.3, pulse 94, respirations 20, blood pressure 161/93.  She is alert and breathing comfortably at rest.  She does have the intermittent cough.  The TMs are difficult to visualize due to wax.  The nose reveals clear secretions.  The pharynx is unremarkable.  Neck is supple with no adenopathy.  Lungs clear after coughing.  Heart regular with no murmurs. Her oxygen saturation is 96% on room air.  ASSESSMENT:  Bronchitis.  PLAN:  Treat with Omnicef 300 mg b.i.d. for 7 days and Tessalon Perles 2 t.i.d.. Dictated by:   Carylon Perches, M.D. Attending Physician:  Carylon Perches DD:  11/27/01 TD:  11/27/01 Job: [id number]

## 2011-01-17 NOTE — Op Note (Signed)
NAME:  Felicia Wells, Felicia Wells              ACCOUNT NO.:  [id number]   MEDICAL RECORD NO.:  [id number]          PATIENT TYPE:  AMB   LOCATION:  DAY                           FACILITY:  [facility]   PHYSICIAN:  Lionel December, M.D.    DATE OF BIRTH:  02-04-1919   DATE OF PROCEDURE:  06/03/2005  DATE OF DISCHARGE:                                 OPERATIVE REPORT   PROCEDURE:  Colonoscopy.   INDICATIONS:  Ritika is an 77 year old Caucasian female with  chronic/recurrent nonbloody diarrhea whose stool studies have been negative  in the past. She did not respond to a course of Cipro and Flagyl. She is  undergoing diagnostic colonoscopy. Procedure risks were reviewed the  patient, and informed consent was obtained. Her last colonoscopy was in 1995  revealing pan colonic diverticulosis. Procedure risks were reviewed with the  patient and informed consent was obtained.   PREMEDICATION:  Fentanyl 25 mcg q.d., Versed 2 mg IV.   FINDINGS:  Procedure performed in endoscopy suite. The patient's vital signs  and O2 saturation were monitored during the procedure remained stable. The  patient was placed in left lateral position and rectal examination  performed. No abnormality noted on external or digital exam. Olympus  videoscope was placed in rectum and advanced under vision into sigmoid colon  and beyond. She had scattered diverticula throughout the colon, but most of  these were in sigmoid colon. Scope was passed to cecum which was identified  by appendiceal orifice and ileocecal valve. Pictures taken for the record.  As the scope was withdrawn, colonic mucosa was carefully examined and was  normal throughout. No polyps and/or tumor masses were noted either. Random  biopsies taken from distal sigmoid colon for routine histology. Rectal  mucosa was normal. Scope was retroflexed to examine anorectal junction, and  small hemorrhoids were noted below the dentate line. Endoscope was  straightened and withdrawn.  The patient tolerated the procedure well.   FINAL DIAGNOSIS:  1.  Pan colonic diverticulosis.  2.  No endoscopic evidence of colitis. Biopsy taken from sigmoid colon      looking for microscopic and/or collagenous colitis.  3.  Small external hemorrhoids.   RECOMMENDATIONS:  1.  She can resume her high-fiber diet and start Metamucil at one      tablespoonful daily.  2.  Imodium OTC 1 mg twice a day as needed.  3.  I will be contacting the patient with biopsy results and further      recommendations.      Lionel December, M.D.  Electronically Signed     NR/MEDQ  D:  06/03/2005  T:  06/03/2005  Job:  [id number]   cc:   Ramon Dredge L. Juanetta Gosling, M.D.  Fax: [phone]

## 2011-01-17 NOTE — Op Note (Signed)
[place] X[facility]  Patient:    Felicia Wells, Felicia Wells                     MRN: [id number] Proc. Date: 03/11/01 Adm. Date:  [id number] Attending:  Tommy Medal CC:         Kari Baars, M.D.   Operative Report  INDICATIONS AND JUSTIFICATIONS FOR THE PROCEDURE:  The patient was seen in my office on February 18, 2001 through the referral of some family members and friends.  She is followed medically by Dr. Kari Baars in [place].  She had a complete examination and could be refracted to 20/20 in the right eye and 20/30 in the left.  She has moderate cataracts but is happy with this level of vision and is actually doing quite well as far as the cataracts go. The pupils, motility, conjunctiva, cornea, anterior chamber and fundus exam was unremarkable except she does have some scattered irregular drusen in the fundus and has signs of dry eyes.  Externally, she has extremely severe blepharochalasis with redundant skin completely covering her eye lashes and coming over the edge of each pupil.  Visual field testing with the skin taped shows that there is a large loss of vision caused by the redundant skin. Photographs were taken to document the condition.  She reports that she is very bothered by the redundant skin and this does block her upper vision.  She says she is unhappy about this and is very annoyed visually.  She almost must lift the skin up sometimes in order to see better.  Also, she can feel the weight of the skin and it causes some irritation.  After discussing this with her, she felt that she did want to go ahead and have upper eyelid blepharoplasties. Actually, that is the main reason she came to my office reporting that she was unhappy with the skin above her eyelids and wanted to have something done about this.  Please note that she is 78 years old and is not concerned about this cosmetically.  I agree with her that the skin is a medical  problem and the plan is to perform a blepharoplasty in order for her to see better.  JUSTIFICATION FOR PERFORMING PROCEDURE IN OUTPATIENT SETTING:  Routine.  JUSTIFICATION FOR OVERNIGHT STAY:  None.  PREOPERATIVE DIAGNOSIS:  Blepharochalasis with visual impairment.  POSTOPERATIVE DIAGNOSIS:  Blepharochalasis with visual impairment.  OPERATION PERFORMED:  Upper eyelid optical blepharoplasties.  SURGEON:  Robert L. Dione Booze, M.D.  ANESTHESIA:  1% Xylocaine with epinephrine.  DESCRIPTION OF PROCEDURE:  The patient arrived in the minor surgery room at [facility] and was prepped and draped in the routine fashion.  A frontal nerve block consisting of 1% Xylocaine was given on each side and the skin to be removed was carefully demarcated and then excised. Please note there was a very large amount of redundant skin.  Some underlying fatty tissue was excised and bleeding was controlled with pressure and with the use of the cautery.  Next, each wound was closed with a running 6-0 nylon suture and pressure patches were applied.  The patient then left the minor room having done well.  FOLLOW-UP:  The patient will be seen in my office in four days to remove the sutures.  She is to remove the patches in several hours and is to use warm compresses to clean the eyes and for comfort.  She is to use Polysporin ointment  in her eyes at night. DD:  03/11/01 TD:  03/11/01 Job: [id number]

## 2011-01-17 NOTE — Consult Note (Signed)
NAME:  Felicia Wells, Felicia Wells              ACCOUNT NO.:  [id number]   MEDICAL RECORD NO.:  [id number]          PATIENT TYPE:  AMB   LOCATION:  DAY                           FACILITY:  [facility]   PHYSICIAN:  R. Roetta Sessions, M.D. DATE OF BIRTH:  [birth year]   DATE OF CONSULTATION:  DATE OF DISCHARGE:                                   CONSULTATION   PRIMARY CARE PHYSICIAN:  Dr. Juanetta Gosling.   CHIEF COMPLAINT:  Diarrhea.   HISTORY OF PRESENT ILLNESS:  Felicia Wells is an 77 year old lady who developed  diarrhea about 5 weeks ago.  She denies any history of chronic diarrhea in  the past.  She started having multiple loose, watery stools as opposed to  her baseline of one bowel movement daily.  Symptoms have been persistent for  the last 5 weeks.  Currently she is down to about three to four watery  stools on average a day.  She is using Pepto-Bismol intermittently which  seems to slow it down.  She complains of lower abdominal discomfort, more in  the left lower quadrant.  She denies any nausea or vomiting.  She has lost  four pounds since this started.  She denies any nocturnal diarrhea, melena  or rectal bleeding.  She denies any antibiotic use prior to development of  these symptoms.  However, last week she was treated for a UTI with  Macrodantin.  She did stool studies through Dr. Juanetta Gosling' office and was told  that these were normal.  She is having loose, watery stools as soon as she  gets up in the morning.  Then she has them postprandially.  Her last  colonoscopy was in 1995, at which time she was found to have pancolonic  diverticula.   CURRENT MEDICATIONS:  1.  Hydrochlorothiazide 25 mg daily.  2.  Evista 60 mg daily.  3.  Calcium with vitamin D 2 daily.  4.  Vitamin D 400 units q.h.s.  5.  Multivitamin daily.  6.  B complex daily.  7.  Aspirin 81 mg daily.   ALLERGIES:  Multiple, CODEINE, SULFA, DYAZIDE, DARVOCET, DEMEROL,  BUTAZOLIDIN, ZOCOR, NIASPAN, PRAVACHOL, CORTISONE, PREDNISONE,  ACTONEL,  DITROPAN, VYTORIN, FOSAMAX.   PAST MEDICAL HISTORY:  Diverticulitis last year.  A history of pancolonic  diverticulosis on the colonoscopy in 1995, osteoporosis, Meniere's disease,  a history of H. pylori, status post treatment in 1998.  EGD at that time was  unremarkable.   FAMILY HISTORY:  Aneurysms.   PAST SURGICAL HISTORY:  Left ear shunt, thyroidectomy, D&C, tonsillectomy,  left foot surgery, hernia repair in the groin, bilateral eyelid surgery,  cataract surgery.   FAMILY HISTORY:  Mother died at age 7.  Father died at age 2.   SOCIAL HISTORY:  She is married, a housewife, nonsmoker, nondrinker.   REVIEW OF SYSTEMS:  See HPI for GI and constitutional.  GENITOURINARY:  Recent UTI, currently no dysuria.  CARDIOPULMONARY:  No chest pain or  shortness of breath.   PHYSICAL EXAMINATION:  VITAL SIGNS:  Weight 134.  Height 5 feet 3 inches.  Temperature 98.2, blood pressure 142/76, pulse  84.  GENERAL:  A pleasant, elderly, well-nourished, well-developed Caucasian  female in no acute distress.  She appears much younger than her stated age.  SKIN:  Warm and dry.  No jaundice.  HEENT:  Conjunctivae were pink.  Sclerae were nonicteric.  Oropharyngeal  mucosa moist and pink.  No lesions, erythema or exudate.  No lymphadenopathy  or thyromegaly.  CHEST:  Lungs are clear to auscultation.  CARDIAC:  Exam reveals regular rate and rhythm.  Normal S1, S2.  No murmurs,  rubs or gallops.  ABDOMEN:  Positive bowel sounds.  Soft, nondistended.  Mild left lower  quadrant tenderness to deep palpation.  No organomegaly or masses.  No  rebound tenderness or guarding.  No abdominal bruits or hernias.  EXTREMITIES:  No edema.  RECTAL:  Examination reveals small hemorrhoid, nonthrombosed, nonbleeding.  No masses in the rectal vault.  Secretions are hemoccult negative.   IMPRESSION:  Felicia Wells is an 77 year old lady with a 5-week history of  persistent diarrhea associated with lower  abdominal pain, more so in the  left lower quadrant region.  I would be concerned about infectious colitis.  She reports having negative stool studies recently.  However, secondary to  decreased sensitivity this does not rule out the possibility.  In addition,  she reports eating spinach prior to the development of these symptoms and is  concerned about the possibility of E. coli 015H7.  Other possibilities  include microscopic or collagenous colitis.  She is well overdue for a  colonoscopy and is very much interested in proceeding with colonoscopy at  this time.   PLAN:  She would like to have symptomatic treatment for her diarrhea at this  time.  I advised that we go ahead and recheck her stools for occult or C.  diff in addition to E. coli 0157H7.  After she collects her stool, she will  start Cipro 500 mg p.o. b.i.d. for 10 days and Flagyl 250 mg p.o. t.i.d. for  10 days.  Prescriptions were given with zero refills.  She may also use  Imodium t.i.d. p.r.n. diarrhea.  We will proceed with colonoscopy as well.  We will request stool  studies and blood work done recently through Dr. Juanetta Gosling' office.  She was  advised to drink plenty of fluids.  She will eat a low-residue diet.   Dr. Jena Gauss is cosigner in the absence of Dr. Karilyn Cota.  Procedure to be done by  Dr. Karilyn Cota.      Tana Coast, P.AJonathon Bellows, M.D.  Electronically Signed    LL/MEDQ  D:  05/30/2005  T:  05/30/2005  Job:  [id number]   cc:   Ramon Dredge L. Juanetta Gosling, M.D.  Fax: [phone]

## 2011-04-15 ENCOUNTER — Other Ambulatory Visit (HOSPITAL_COMMUNITY): Payer: Self-pay | Admitting: Pulmonary Disease

## 2011-04-15 DIAGNOSIS — Z139 Encounter for screening, unspecified: Secondary | ICD-10-CM

## 2011-04-17 ENCOUNTER — Ambulatory Visit (HOSPITAL_COMMUNITY)
Admission: RE | Admit: 2011-04-17 | Discharge: 2011-04-17 | Disposition: A | Discharge status: Home / Self Care | Payer: Medicare Other | Source: Ambulatory Visit | Attending: Pulmonary Disease | Admitting: Pulmonary Disease

## 2011-04-17 DIAGNOSIS — Z1231 Encounter for screening mammogram for malignant neoplasm of breast: Secondary | ICD-10-CM | POA: Insufficient documentation

## 2011-04-17 DIAGNOSIS — Z139 Encounter for screening, unspecified: Secondary | ICD-10-CM

## 2011-04-21 ENCOUNTER — Other Ambulatory Visit (HOSPITAL_COMMUNITY): Payer: Self-pay | Admitting: Pulmonary Disease

## 2011-04-21 ENCOUNTER — Ambulatory Visit (HOSPITAL_COMMUNITY)
Admission: RE | Admit: 2011-04-21 | Discharge: 2011-04-21 | Disposition: A | Discharge status: Home / Self Care | Payer: Medicare Other | Source: Ambulatory Visit | Attending: Pulmonary Disease | Admitting: Pulmonary Disease

## 2011-04-21 DIAGNOSIS — M51379 Other intervertebral disc degeneration, lumbosacral region without mention of lumbar back pain or lower extremity pain: Secondary | ICD-10-CM | POA: Insufficient documentation

## 2011-04-21 DIAGNOSIS — M899 Disorder of bone, unspecified: Secondary | ICD-10-CM | POA: Insufficient documentation

## 2011-04-21 DIAGNOSIS — M5137 Other intervertebral disc degeneration, lumbosacral region: Secondary | ICD-10-CM | POA: Insufficient documentation

## 2011-04-21 DIAGNOSIS — R52 Pain, unspecified: Secondary | ICD-10-CM

## 2011-04-21 DIAGNOSIS — M545 Low back pain, unspecified: Secondary | ICD-10-CM | POA: Insufficient documentation

## 2011-04-21 DIAGNOSIS — M549 Dorsalgia, unspecified: Secondary | ICD-10-CM

## 2011-04-21 DIAGNOSIS — M25559 Pain in unspecified hip: Secondary | ICD-10-CM

## 2011-05-26 LAB — COMPREHENSIVE METABOLIC PANEL
Alkaline Phosphatase: 47
BUN: 11
CO2: 28
Chloride: 94 — ABNORMAL LOW
GFR calc non Af Amer: >60
Glucose, Bld: 153 — ABNORMAL HIGH
Potassium: 2.9 — ABNORMAL LOW
Total Bilirubin: 1.1

## 2011-05-26 LAB — DIFFERENTIAL
Basophils Absolute: 0
Basophils Relative: 0
Basophils Relative: 1
Lymphs Abs: 1.3
Monocytes Absolute: 1.3 — ABNORMAL HIGH
Monocytes Relative: 11
Neutro Abs: 13.4 — ABNORMAL HIGH
Neutro Abs: 8.7 — ABNORMAL HIGH
Neutrophils Relative %: 85 — ABNORMAL HIGH

## 2011-05-26 LAB — BASIC METABOLIC PANEL
CO2: 30
Calcium: 8.3 — ABNORMAL LOW
Chloride: 101
GFR calc Af Amer: >60
Sodium: 135

## 2011-05-26 LAB — URINALYSIS, ROUTINE W REFLEX MICROSCOPIC
Bilirubin Urine: NEGATIVE
Nitrite: POSITIVE — AB
Specific Gravity, Urine: 1.01
pH: 5.5

## 2011-05-26 LAB — CBC
HCT: 37.5
Hemoglobin: 11.4 — ABNORMAL LOW
Hemoglobin: 13.1
MCHC: 34.8
RBC: 3.43 — ABNORMAL LOW
RDW: 13.5
WBC: 11.4 — ABNORMAL HIGH
WBC: 15.7 — ABNORMAL HIGH

## 2011-05-26 LAB — URINE MICROSCOPIC-ADD ON

## 2011-05-26 LAB — CULTURE, BLOOD (ROUTINE X 2): Report Status: 3142009

## 2011-05-26 LAB — URINE CULTURE

## 2011-06-03 ENCOUNTER — Ambulatory Visit (HOSPITAL_COMMUNITY)
Admission: RE | Admit: 2011-06-03 | Discharge: 2011-06-03 | Disposition: A | Discharge status: Home / Self Care | Payer: Medicare Other | Source: Ambulatory Visit | Attending: Pulmonary Disease | Admitting: Pulmonary Disease

## 2011-06-03 DIAGNOSIS — R262 Difficulty in walking, not elsewhere classified: Secondary | ICD-10-CM | POA: Insufficient documentation

## 2011-06-03 DIAGNOSIS — IMO0001 Reserved for inherently not codable concepts without codable children: Secondary | ICD-10-CM | POA: Insufficient documentation

## 2011-06-03 DIAGNOSIS — M545 Low back pain, unspecified: Secondary | ICD-10-CM | POA: Insufficient documentation

## 2011-06-03 DIAGNOSIS — M6281 Muscle weakness (generalized): Secondary | ICD-10-CM | POA: Insufficient documentation

## 2011-06-03 DIAGNOSIS — M79609 Pain in unspecified limb: Secondary | ICD-10-CM | POA: Insufficient documentation

## 2011-06-03 NOTE — Progress Notes (Signed)
Physical Therapy Evaluation  Patient Details  Name: Felicia Wells MRN: [id number] Date of Birth: Jan 22, 1919  Today's Date: 06/03/2011 Time: 1191-4782 Time Calculation (min): 53 min Visit#: 1  of 8   Re-eval: 07/03/11 Assessment Diagnosis: Low back pain. Next MD Visit:  (not scheduled) Prior Therapy: home health years ago. Charges :  Evaluation Past Medical History: No past medical history on file. Past Surgical History: No past surgical history on file.  Subjective Symptoms/Limitations Symptoms: Pt states that she began having balance problems as well as back and leg pain. The patient states about four weeks ago she had pain that went down the outside of her left leg that was so bad she could not walk.  She was placed on prednisone but had a bad reaction so she had to go off of it.  The patient states that the pain in her leg is gone now.  She has had chronic back pain and she is still having pain in her back whenever she is weight bearing..  She states that if she stands more than thirty minutes she needs to sit down.  She is only able to walk for five minutes outside due to a small incline. She states that when she sits down the pain subsides.   She states that she does not have to sit long to recover.  She would like to be able to be up for longer periods of time and be able to get out more.   She was diagnosed with osteoporosis two years ago and has been doing exercises at home as well as doing pool exercises; (although she has not done land or water exercises since her injury four weeks ago.) The patient states that her  balance is also a concern stating that if she is walking she needs to hold onto her husbands hand for support.  She has been referred to thearpy to improve her core strength to help decrease her back pain and improve her balance. How long can you sit comfortably?: no problem How long can you stand comfortably?: almost 30 minutes. How long can you walk comfortably?: less  than 5 min outside;  Pt has not shopped even to go to the grocery store. Pain Assessment Currently in Pain?: No/denies (Past week the highest has been a 10 ) Pain Location: Back Pain Orientation: Right;Left;Lower Pain Type: Chronic pain Multiple Pain Sites: No  Precautions/Restrictions  Precautions Precautions: Fall  Prior Functioning  Home Living Type of Home: House Lives With: Spouse Receives Help From: Family Home Layout: Able to live on main level with bedroom/bathroom Home Access: Stairs to enter Entrance Stairs-Rails: Right Entrance Stairs-Number of Steps: 2 (painful to go up) Prior Function Level of Independence: Independent with basic ADLs   Objective: RLE Strength Right Hip Flexion: 4/5 Right Hip Extension: 4/5 Right Hip ABduction: 5/5 Right Hip ADduction: 5/5 Right Knee Flexion: 3+/5 Right Knee Extension: 5/5 Right Ankle Dorsiflexion: 3+/5 LLE Strength Left Hip Flexion: 4/5 Left Hip Extension: 4/5 Left Hip ABduction: 5/5 Left Hip ADduction: 5/5 Left Knee Flexion: 4/5 Left Knee Extension: 5/5 Left Ankle Dorsiflexion: 3+/5 Lumbar AROM Lumbar Flexion: decreased 30% Lumbar Extension: decreased 30% Lumbar - Right Side Bend: decreased 20% Lumbar - Left Side Bend: decreased 30 % Lumbar - Right Rotation: dereased 30% Lumbar - Left Rotation: decreased 40% Lumbar Strength Lumbar Flexion: 3-/5 Lumbar Extension: 3-/5  Posture:  The patient has increased kyphosis, decreased lordosis and has a lateral shift to the left.   Exercise/Treatments Stretches Standing  Extension: 5 reps Prone on Elbows Stretch: Limitations Prone on Elbows Stretch Limitations: R lateral shift of hips    Physical Therapy Assessment and Plan PT Assessment and Plan Clinical Impression Statement: Pt with postural changes, osteoporosis and weakened core mm contributing to back pain who will benefti from skilled physical therapy to address the above issues. Rehab Potential: Good Clinical  Impairments Affecting Rehab Potential: weak mm, decreased ROM, pain PT Frequency: Min 2X/week PT Duration: 4 weeks PT Treatment/Interventions: Therapeutic exercise;Balance training PT Plan: see pt two times a week for postural, stability and balance activities.  Begin Meeks decompression exercises Sheets I and II next treatment.  If able begin core ab work.    Goals Home Exercise Program Pt will Perform Home Exercise Program: Independently PT Short Term Goals Time to Complete Short Term Goals: 2 weeks PT Short Term Goal 1: Pt pain to be decreased by  levels  PT Short Term Goal 2: Pt to be able to walk for 15 minutes without increased pain PT Long Term Goals Time to Complete Long Term Goals: 4 weeks PT Long Term Goal 1: I in advance HEP PT Long Term Goal 2: Pain level to be decreased 5 levels when weight bearing. Long Term Goal 3: Pt to be able to ambulate for 30 minutes without increased pain.  Problem List Patient Active Problem List  Diagnoses  . Muscle weakness (generalized)  . Difficulty in walking    PT - End of Session Activity Tolerance: Patient tolerated treatment well General Behavior During Session: [facility] for tasks performed Cognition: [facility] for tasks performed   Adelie Croswell,CINDY 06/03/2011, 12:27 PM  Physician Documentation Your signature is required to indicate approval of the treatment plan as stated above.  Please sign and either send electronically or make a copy of this report for your files and return this physician signed original.   Please mark one 1.__approve of plan  2. ___approve of plan with the following conditions.   ______________________________                                                          _____________________ Physician Signature                                                                                                             Date

## 2011-06-03 NOTE — Patient Instructions (Addendum)
HEP- given Meeks I sheet

## 2011-06-05 ENCOUNTER — Ambulatory Visit (HOSPITAL_COMMUNITY)
Admission: RE | Admit: 2011-06-05 | Discharge: 2011-06-05 | Disposition: A | Discharge status: Home / Self Care | Payer: Medicare Other | Source: Ambulatory Visit | Attending: Physical Therapy | Admitting: Physical Therapy

## 2011-06-05 DIAGNOSIS — M6281 Muscle weakness (generalized): Secondary | ICD-10-CM

## 2011-06-05 NOTE — Progress Notes (Signed)
Physical Therapy Treatment Patient Details  Name: Felicia Wells MRN: [id number] Date of Birth: 1919-06-29  Today's Date: 06/05/2011 Time: 4098-1191 Time Calculation (min): 40 min Visit#: 2  of 8   Re-eval: 07/03/11  There ex:   Subjective: Symptoms/Limitations Symptoms: Pt states she tried to do her exercises but has some questions on them. Reviewed exercises given last treatment as a HEp       Exercise/Treatments Stretches Standing Extension: 5 reps Prone on Elbows Stretch: Limitations Prone on Elbows Stretch Limitations: lateral shift to R x 10 Lumbar Exercises Supine: Meek decompression ex now HEP Meeks T-band postural ex need to review. Stability Ab Set: 10 reps     Physical Therapy Assessment and Plan PT Assessment and Plan Clinical Impression Statement: Pt demonstrates good technique with exercises PT Plan: begin clam, bend knee raise, and bridge next treatment.    Goals  no back pain  Problem List Patient Active Problem List  Diagnoses  . Muscle weakness (generalized)  . Difficulty in walking    PT - End of Session Activity Tolerance: Patient tolerated treatment well General Behavior During Session: [facility] for tasks performed Cognition: [facility] for tasks performed  Anuj Summons,CINDY 06/05/2011, 1:47 PM

## 2011-06-05 NOTE — Patient Instructions (Signed)
Hep

## 2011-06-10 ENCOUNTER — Ambulatory Visit (HOSPITAL_COMMUNITY)
Admission: RE | Admit: 2011-06-10 | Discharge: 2011-06-10 | Disposition: A | Discharge status: Home / Self Care | Payer: Medicare Other | Source: Ambulatory Visit | Attending: Pulmonary Disease | Admitting: Pulmonary Disease

## 2011-06-10 NOTE — Progress Notes (Signed)
Physical Therapy Treatment Patient Details  Name: Felicia Wells MRN: [id number] Date of Birth: 08/08/1919  Today's Date: 06/10/2011 Time: 4098-1191 Time Calculation (min): 34 min Visit#: 3  of 8   Re-eval: 07/03/11 Charges:  therex 32' (unable to complete all secondary to pt. Being late for appt.)    Subjective: Symptoms/Limitations Symptoms: Pt. states she's not having any back pain, however reports that's not uncommon in the beginning of the day; her pain is intermittent throughtout the day.  States she's been doing her HEP and working on the proper way to get into the bed.  Pt. was late for appt. today. Pain Assessment Currently in Pain?: No/denies  Exercise/Treatments Stretches Standing Extension: 5 reps Prone on Elbows Stretch 5 minutes; progressed to POE last minute Lumbar Exercises Decompression tband exercises reviewed for HEP Stability Clam: 5 reps Bridge: 5 reps Bent Knee Raise: 5 reps    Physical Therapy Assessment and Plan PT Assessment and Plan Clinical Impression Statement: Pt. able to demonstrate decompression theraband exercises correctly.  Pt. with good control and stabilization with therex.  Added new stab exercises without difficulty today. PT Treatment/Interventions: Therapeutic exercise PT Plan: Continue to progress reps and stability.    Problem List Patient Active Problem List  Diagnoses  . Muscle weakness (generalized)  . Difficulty in walking    PT - End of Session Activity Tolerance: Patient tolerated treatment well General Behavior During Session: [facility] for tasks performed Cognition: [facility] for tasks performed  Emeline Gins B 06/10/2011, 8:44 AM

## 2011-06-12 ENCOUNTER — Ambulatory Visit (HOSPITAL_COMMUNITY)
Admission: RE | Admit: 2011-06-12 | Discharge: 2011-06-12 | Disposition: A | Discharge status: Home / Self Care | Payer: Medicare Other | Source: Ambulatory Visit | Attending: Pulmonary Disease | Admitting: Pulmonary Disease

## 2011-06-12 NOTE — Progress Notes (Signed)
Physical Therapy Treatment Patient Details  Name: Felicia Wells MRN: [id number] Date of Birth: Nov 09, 1918  Today's Date: 06/12/2011 Time: 1017-1100 Time Calculation (min): 43 min Visit#: 4  of 8   Re-eval: 07/03/11 Charges: Therex x 38'  Subjective: Symptoms/Limitations Symptoms: No pain. Pt reports HEP compliance. Pain Assessment Currently in Pain?: No/denies  Exercise/Treatments Stability Clam: 10 reps Bridge: 10 reps Bent Knee Raise: 10 reps Isometric Hip Flexion: 10 reps Large Ball Abdominal Isometric: 10 reps;5 seconds Straight Leg Raise: 10 reps  Physical Therapy Assessment and Plan PT Assessment and Plan Clinical Impression Statement: Pt completes therex with minimal difficulty and without c/o increased pain. Began new therex wit increase core strength. HEP given for new therex. Pt reports no increase in pain at end of session. PT Treatment/Interventions: Therapeutic exercise PT Plan: Continue per PT POC.     Problem List Patient Active Problem List  Diagnoses  . Muscle weakness (generalized)  . Difficulty in walking    PT - End of Session Activity Tolerance: Patient tolerated treatment well General Behavior During Session: [facility] for tasks performed Cognition: [facility] for tasks performed  Antonieta Iba 06/12/2011, 12:18 PM

## 2011-06-17 ENCOUNTER — Ambulatory Visit (HOSPITAL_COMMUNITY)
Admission: RE | Admit: 2011-06-17 | Discharge: 2011-06-17 | Disposition: A | Discharge status: Home / Self Care | Payer: Medicare Other | Source: Ambulatory Visit | Attending: Pulmonary Disease | Admitting: Pulmonary Disease

## 2011-06-17 NOTE — Progress Notes (Signed)
Physical Therapy Treatment Patient Details  Name: Felicia Wells MRN: [id number] Date of Birth: Jun 09, 1919  Today's Date: 06/17/2011 Time: 4098-1191 Time Calculation (min): 51 min Visit#: 5  of 8   Re-eval: 07/03/11 Charges:  therex 79' (extra time needed for explannation)    Subjective: Symptoms/Limitations Symptoms: Pt. reports she is doing great.  No back pain today.  States her HEP takes her an hour to complete. Pain Assessment Currently in Pain?: No/denies   Exercise/Treatments Stretches Prone on Elbows Stretch: Other (comment) Prone on Elbows Stretch Limitations: 2 minutes without difficulty LStability Clam: 10 reps Bridge: 10 reps Bent Knee Raise: 10 reps Isometric Hip Flexion: 10 reps;5 seconds Large Ball Abdominal Isometric: 10 reps;5 seconds Straight Leg Raise: 10 reps Heel Squeeze: Prone;10 reps Single Arm Raise: Prone;5 reps;Limitations Single Arm Raises Limitations: Requires AA with R UE Leg Raise: Prone;5 reps  Physical Therapy Assessment and Plan PT Assessment and Plan Clinical Impression Statement: Pt. able to complete therex with minimal VC's; Able to add prone therex without dizziness or back pain.  Pt. pleased with ability to lay prone on elbows without pain today. PT Plan: Add treadmill and standing exercises next visit.     Problem List Patient Active Problem List  Diagnoses  . Muscle weakness (generalized)  . Difficulty in walking    PT - End of Session Activity Tolerance: Patient tolerated treatment well General Behavior During Session: [facility] for tasks performed Cognition: [facility] for tasks performed  Emeline Gins B 06/17/2011, 11:20 AM

## 2011-06-19 ENCOUNTER — Ambulatory Visit (HOSPITAL_COMMUNITY)
Admission: RE | Admit: 2011-06-19 | Discharge: 2011-06-19 | Disposition: A | Discharge status: Home / Self Care | Payer: Medicare Other | Source: Ambulatory Visit | Attending: Pulmonary Disease | Admitting: Pulmonary Disease

## 2011-06-19 DIAGNOSIS — M6281 Muscle weakness (generalized): Secondary | ICD-10-CM

## 2011-06-19 NOTE — Progress Notes (Signed)
Physical Therapy Treatment Patient Details  Name: Felicia Wells MRN: [id number] Date of Birth: [birth year]  Today's Date: 06/19/2011 Time: 3244-0102 Time Calculation (min): 41 min Visit#: 6  of 8   Re-eval: 06/26/11  Charge:  There ex 41  Subjective: Symptoms/Limitations Symptoms: My back pain is improving but it stilll starts to hurt if I'm cooking for over 30 minutes.           Exercise/Treatments Stretches Standing Extension: 5 reps Prone on Elbows Stretch: 1 rep;30 seconds Lumbar Exercises   Stability Large Ball Abdominal Isometric: 10 reps Hip Abduction: 10 reps Heel Squeeze: 15 reps Single Arm Raise: 10 reps Leg Raise: 10 reps Opposite Arm/Leg Raise:  (7 reps) Machine Exercises Tread Mill: 1.0x 5'     Physical Therapy Assessment and Plan PT Assessment and Plan Clinical Impression Statement: Improved ability to complete prone ex without assisgt Clinical Impairments Affecting Rehab Potential: Pt continues to strengthen PT Frequency: Min 2X/week PT Treatment/Interventions: Therapeutic exercise PT Plan: Continue next week;  re-eval    Goals    Problem List Patient Active Problem List  Diagnoses  . Muscle weakness (generalized)  . Difficulty in walking    PT - End of Session Activity Tolerance: Patient tolerated treatment well General Behavior During Session: [facility] for tasks performed Cognition: [facility] for tasks performed  Reva Pinkley,CINDY 06/19/2011, 11:07 AM

## 2011-06-24 ENCOUNTER — Ambulatory Visit (HOSPITAL_COMMUNITY)
Admission: RE | Admit: 2011-06-24 | Discharge: 2011-06-24 | Disposition: A | Discharge status: Home / Self Care | Payer: Medicare Other | Source: Ambulatory Visit | Attending: Pulmonary Disease | Admitting: Pulmonary Disease

## 2011-06-24 NOTE — Progress Notes (Signed)
Physical Therapy Treatment Patient Details  Name: Felicia Wells MRN: [id number] Date of Birth: Jul 23, 1919  Today's Date: 06/24/2011 Time: 1020-1110 Time Calculation (min): 50 min Visit#: 7  of 8   Re-eval: 06/26/11 Charges: Therex x 40'  Subjective: Symptoms/Limitations Symptoms: Pt reports that she can feel improvements in her strength. Pain Assessment Currently in Pain?: No/denies   Exercise/Treatments Stability Clam: 10 reps;Side-lying Bridge: 15 reps Bent Knee Raise: 15 reps Isometric Hip Flexion: 15 reps;5 seconds Straight Leg Raise: 15 reps Hip Abduction: 10 reps;Side-lying  Physical Therapy Assessment and Plan PT Assessment and Plan Clinical Impression Statement: Progressed clams to sidelying this tx with multimiodal cueing for hip stabilization. Pt completes supine therex with good core control. PT Treatment/Interventions: Therapeutic exercise PT Plan: Reassess next tx.     Problem List Patient Active Problem List  Diagnoses  . Muscle weakness (generalized)  . Difficulty in walking    Antonieta Iba 06/24/2011, 12:17 PM

## 2011-06-26 ENCOUNTER — Ambulatory Visit (HOSPITAL_COMMUNITY)
Admission: RE | Admit: 2011-06-26 | Discharge: 2011-06-26 | Disposition: A | Discharge status: Home / Self Care | Payer: Medicare Other | Source: Ambulatory Visit | Attending: Pulmonary Disease | Admitting: Pulmonary Disease

## 2011-06-26 DIAGNOSIS — M6281 Muscle weakness (generalized): Secondary | ICD-10-CM

## 2011-06-26 NOTE — Progress Notes (Signed)
Physical Therapy RE- Evaluation  Patient Details  Name: BETTA BALLA MRN: [id number] Date of Birth: February 16, 1919  Today's Date: 06/26/2011 Time: 1030-1120 Time Calculation (min): 50 min Visit#: 8  of 8   Re-eval: 07/26/11  Subjective Symptoms/Limitations Symptoms: Patient states that she is improving. How long can you sit comfortably?: no problem How long can you stand comfortably?: The patient states that she is able to stand for an hour now it was 30 min. at initial evaluation. How long can you walk comfortably?: The patient states that she has done very little walking.  She has not been shopping or walking up and down the driveway like she was.  Therapist took patient out and she was able to walk for  four minutes.  At this time she was SOB,and felt as if she could not pick her legs up and move them..  Pt was encouraged to start a four min. walking program 2-3 times a day. Pain Assessment Currently in Pain?: Yes Pain Score:   2 states it can get as high as a 4 at initial evaluation the patient was stating that her pain could get as high as a 10. Pain Location: Hip Pain Orientation: Right;Left Pain Type: Chronic pain     Objective: RLE Strength Right Hip Flexion:  (4+) was a 4 Right Hip Extension: 5/5 was a 4 Right Hip ABduction: 5/5 Right Hip ADduction: 5/5 Right Knee Flexion: 5/5 was a 3+ Right Knee Extension: 5/5 Right Ankle Dorsiflexion: 4/5 was a 3+ LLE Strength Left Hip Flexion: 5/5 was a 4 Left Hip Extension: 4/5 was a 4 Left Hip ABduction: 5/5 Left Hip ADduction: 5/5 Left Knee Flexion: 4/5 was a 4 Left Knee Extension: 5/5 Left Ankle Dorsiflexion: 4/5 was a 3+ Lumbar AROM Lumbar Flexion: decreased 10 % was decreased 30% Lumbar Extension: wnl was decreased 30% Lumbar - Right Side Bend: wnl was decreased 20% Lumbar - Left Side Bend: decreased 10% was decreased 30% Lumbar - Right Rotation: wnl was decreased 30% Lumbar - Left Rotation: decreased 20% was  decreased 40% Lumbar Strength Lumbar Flexion: 3/5 was 3- Lumbar Extension: 3-/5 was 3-  Exercise/Treatments Prone: L knee flexion with 3# weight x 10 reps, prone hip extension x 10 reps.  Gait trained in hospital hallway concentrating on keeping abs tight and pulling hips to the left for better alignment x 4' Exercise limited by re-evaluation today.    Physical Therapy Assessment and Plan PT Assessment and Plan Clinical Impression Statement: Patient has improved with pain, and standing tolerance and most mm strengths.  She is still having difficulty with prolong walking and paosture. Clinical Impairments Affecting Rehab Potential: strength, posture PT Frequency: Min 2X/week PT Duration: 4 weeks PT Plan: Continue with therapy; begin T-band exercises next treatment for postural control.  Rx following may begin wall planks on forearms as well as wall pushups.    Goals Home Exercise Program PT Goal: Perform Home Exercise Program - Progress: Met PT Short Term Goals PT Short Term Goal 1 - Progress: Met PT Short Term Goal 2 - Progress: Not met PT Long Term Goals PT Long Term Goal 2 - Progress: Met Long Term Goal 3 Progress: Not met  Recommend to continue 2x/wk for 4 weeks to continue to work on goals that have not been met.  Pt to be able to ambulate 15 minutes to allow better quality of life.  Pain level to be no greater than a 2. Problem List Patient Active Problem List  Diagnoses  .  Muscle weakness (generalized)  . Difficulty in walking    PT - End of Session Activity Tolerance: Patient tolerated treatment well General Behavior During Session: [facility] for tasks performed Cognition: [facility] for tasks performed   RUSSELL,CINDY 06/26/2011, 11:28 AM  Physician Documentation Your signature is required to indicate approval of the treatment plan as stated above.  Please sign and either send electronically or make a copy of this report for your files and return this physician signed  original.   Please mark one 1.__approve of plan  2. ___approve of plan with the following conditions.   ______________________________                                                          _____________________ Physician Signature                                                                                                             Date

## 2011-06-26 NOTE — Patient Instructions (Addendum)
HEP for walking 

## 2011-07-01 ENCOUNTER — Ambulatory Visit (INDEPENDENT_AMBULATORY_CARE_PROVIDER_SITE_OTHER): Payer: Medicare Other | Admitting: Urology

## 2011-07-01 ENCOUNTER — Ambulatory Visit (HOSPITAL_COMMUNITY)
Admission: RE | Admit: 2011-07-01 | Discharge: 2011-07-01 | Disposition: A | Discharge status: Home / Self Care | Payer: Medicare Other | Source: Ambulatory Visit | Attending: Pulmonary Disease | Admitting: Pulmonary Disease

## 2011-07-01 DIAGNOSIS — N813 Complete uterovaginal prolapse: Secondary | ICD-10-CM

## 2011-07-01 NOTE — Progress Notes (Signed)
Physical Therapy Treatment Patient Details  Name: Felicia Wells MRN: [id number] Date of Birth: Mar 18, 1919  Today's Date: 07/01/2011 Time: 7829-5621 Time Calculation (min): 40 min Visit#: 9  of 16   Re-eval: 07/26/11 Charges: Therex x 38'   Subjective: Symptoms/Limitations Symptoms: Pt reports she is pain free today. She states she was having 8/10 pain yesterday with walking. Pain Assessment Currently in Pain?: No/denies Pain Score: 0-No pain   Exercise/Treatments Lumbar Exercises Scapular Retraction: 10 reps;Theraband Theraband Level (Scapular Retraction): Level 3 (Green) Row: 10 reps;Theraband Theraband Level (Row): Level 3 (Green) Shoulder Extension: 10 reps;Supine;Theraband Theraband Level (Shoulder Extension): Level 3 (Green) Stability Bridge: 15 reps Straight Leg Raise: 15 reps Prone  Hamstring Curl: 10 reps;Limitations Hamstring Curl Limitations: 3# Hip Extension: 10 reps;Both   Physical Therapy Assessment and Plan PT Assessment and Plan Clinical Impression Statement: Pt requires VC's for abdominal control with SLR. Began tband scapular therex with improve posture. Pt w/o c/o increased pain throughout tx. PT Treatment/Interventions: Therapeutic exercise PT Plan: continue per PT POC.    Goals    Problem List Patient Active Problem List  Diagnoses  . Muscle weakness (generalized)  . Difficulty in walking    PT - End of Session Activity Tolerance: Patient tolerated treatment well General Behavior During Session: [facility] for tasks performed Cognition: [facility] for tasks performed  Antonieta Iba 07/01/2011, 10:34 AM

## 2011-07-03 ENCOUNTER — Ambulatory Visit (HOSPITAL_COMMUNITY)
Admission: RE | Admit: 2011-07-03 | Discharge: 2011-07-03 | Disposition: A | Discharge status: Home / Self Care | Payer: Medicare Other | Source: Ambulatory Visit | Attending: Pulmonary Disease | Admitting: Pulmonary Disease

## 2011-07-03 DIAGNOSIS — M545 Low back pain, unspecified: Secondary | ICD-10-CM | POA: Insufficient documentation

## 2011-07-03 DIAGNOSIS — M79609 Pain in unspecified limb: Secondary | ICD-10-CM | POA: Insufficient documentation

## 2011-07-03 DIAGNOSIS — R262 Difficulty in walking, not elsewhere classified: Secondary | ICD-10-CM | POA: Insufficient documentation

## 2011-07-03 DIAGNOSIS — M6281 Muscle weakness (generalized): Secondary | ICD-10-CM | POA: Insufficient documentation

## 2011-07-03 DIAGNOSIS — IMO0001 Reserved for inherently not codable concepts without codable children: Secondary | ICD-10-CM | POA: Insufficient documentation

## 2011-07-03 NOTE — Progress Notes (Signed)
Physical Therapy Treatment Patient Details  Name: Felicia Wells MRN: [id number] Date of Birth: Jun 15, 1919  Today's Date: 07/03/2011 Time: 9147-8295 Time Calculation (min): 54 min Visit#: 10  of 16   Re-eval: 07/25/11 Charges:  therex 48'    Subjective: Symptoms/Limitations Symptoms: No radicular pain since first week of therapy; No back pain today either.  Pain Assessment Currently in Pain?: No/denies   Exercise/Treatments Stretches Prone on Elbows Stretch: Limitations Prone on Elbows Stretch Limitations: 1 minute Lumbar Exercises Scapular Retraction: 15 reps Theraband Level (Scapular Retraction): Level 3 (Green) Row: 15 reps Theraband Level (Row): Level 3 (Green) Shoulder Extension: 15 reps Theraband Level (Shoulder Extension): Level 3 (Green) Stability Clam: 15 reps Bridge: 15 reps Bent Knee Raise: 15 reps Straight Leg Raise: 15 reps Heel Squeeze: Prone;10 reps (with one pillow) Single Arm Raise: Prone;10 reps (with one pillow) Leg Raise: Prone;10 reps (with one pillow) Opposite Arm/Leg Raise: Prone;5 reps (with one pillow) Machine Exercises Tread Mill: TIME     Physical Therapy Assessment and Plan PT Assessment and Plan Clinical Impression Statement: Improved scapular/UE strength as noted with prone arm lifts  Good form and control with therex today.  Progressed to opposite arm/leg in prone without difficulty.  Overall improving function/decreased pain. PT Treatment/Interventions: Therapeutic exercise PT Plan: Continue per POC.     Problem List Patient Active Problem List  Diagnoses  . Muscle weakness (generalized)  . Difficulty in walking    PT - End of Session Activity Tolerance: Patient tolerated treatment well General Behavior During Session: [facility] for tasks performed Cognition: [facility] for tasks performed  Emeline Gins B 07/03/2011, 11:27 AM

## 2011-07-09 ENCOUNTER — Ambulatory Visit (HOSPITAL_COMMUNITY)
Admission: RE | Admit: 2011-07-09 | Discharge: 2011-07-09 | Disposition: A | Discharge status: Home / Self Care | Payer: Medicare Other | Source: Ambulatory Visit | Attending: Pulmonary Disease | Admitting: Pulmonary Disease

## 2011-07-09 DIAGNOSIS — M6281 Muscle weakness (generalized): Secondary | ICD-10-CM

## 2011-07-09 NOTE — Progress Notes (Signed)
Physical Therapy Treatment Patient Details  Name: JONALYN SEDLAK MRN: [id number] Date of Birth: [birth year]  Today's Date: 07/09/2011 Time: 5621-3086 Time Calculation (min): 47 min Visit#: 11  of 16   Re-eval: 07/25/11   There ex 43 Subjective: Symptoms/Limitations Symptoms: Pt states that she is now able to walk for 14 min. at a time. Pain Assessment Currently in Pain?: No/denies     Exercise/Treatments  added dead bug and ball ex to increase abdominal strength.   Stability Dead Bug: 10 reps Isometric Hip Flexion: 15 reps Large Ball Abdominal Isometric: 10 reps Large Ball Oblique Isometric: 10 reps Straight Leg Raise: 15 reps Heel Squeeze: Prone;10 reps Single Arm Raise: Prone;10 reps Leg Raise: Prone;10 reps Opposite Arm/Leg Raise: Prone;10 reps Plank:  (against wall x 30 sec x 3) Functional Squats: 10 reps Machine Exercises    Physical Therapy Assessment and Plan PT Assessment and Plan Clinical Impression Statement: Pt improving in strength and functional ability. PT Plan: Give pt T-band for home use next treatment;  begin forward and side lunging;    Goals  Able to state no back pain; able to be up for over an hour for functional tasks.  Problem List Patient Active Problem List  Diagnoses  . Muscle weakness (generalized)  . Difficulty in walking    PT - End of Session Activity Tolerance: Patient tolerated treatment well General Behavior During Session: [facility] for tasks performed Cognition: [facility] for tasks performed  Nakai Pollio,CINDY 07/09/2011, 11:47 AM

## 2011-07-10 ENCOUNTER — Ambulatory Visit (HOSPITAL_COMMUNITY)
Admission: RE | Admit: 2011-07-10 | Discharge: 2011-07-10 | Disposition: A | Discharge status: Home / Self Care | Payer: Medicare Other | Source: Ambulatory Visit | Attending: Pulmonary Disease | Admitting: Pulmonary Disease

## 2011-07-10 NOTE — Progress Notes (Signed)
Physical Therapy Treatment Patient Details  Name: VEYDA KAUFMAN MRN: [id number] Date of Birth: 19-May-1919  Today's Date: 07/10/2011 Time: 7829-5621 Time Calculation (min): 50 min Visit#: 12  of 16   Re-eval: 07/25/11 Charges: Therex x 45'  Subjective: Symptoms/Limitations Symptoms: I can tell I'm getting stronger. Pain Assessment Currently in Pain?: Yes Pain Score: 0-No pain   Exercise/Treatments Stability Dead Bug: 10 reps Isometric Hip Flexion: 15 reps Large Ball Abdominal Isometric: 10 reps Large Ball Oblique Isometric: 10 reps Straight Leg Raise: 15 reps Heel Squeeze: 15 reps;5 seconds Leg Raise: Prone;10 reps Opposite Arm/Leg Raise: Prone;10 reps Functional Squats: 10 reps  Physical Therapy Assessment and Plan PT Assessment and Plan Clinical Impression Statement: Pt completes therex with good core control. Pt requires minimal cueing to properly complete exercises. HEP given for scapular tband exercises. PT Treatment/Interventions: Therapeutic exercise PT Plan: Begin forward and side lunging next session.     Problem List Patient Active Problem List  Diagnoses  . Muscle weakness (generalized)  . Difficulty in walking    PT - End of Session Activity Tolerance: Patient tolerated treatment well General Behavior During Session: [facility] for tasks performed Cognition: [facility] for tasks performed  Antonieta Iba 07/10/2011, 12:15 PM

## 2011-07-15 ENCOUNTER — Ambulatory Visit (HOSPITAL_COMMUNITY)
Admission: RE | Admit: 2011-07-15 | Discharge: 2011-07-15 | Disposition: A | Discharge status: Home / Self Care | Payer: Medicare Other | Source: Ambulatory Visit | Attending: Physical Therapy | Admitting: Physical Therapy

## 2011-07-15 DIAGNOSIS — M6281 Muscle weakness (generalized): Secondary | ICD-10-CM

## 2011-07-15 NOTE — Progress Notes (Signed)
Physical Therapy Treatment Patient Details  Name: AMARAH BROSSMAN MRN: [id number] Date of Birth: 1919/07/23  Today's Date: 07/15/2011 Time: 4098-1191 Time Calculation (min): 46 min Visit#: 13  of 16   Re-eval: 07/23/11   Subjective: Symptoms/Limitations Symptoms: I am doing about thirty minutes of exercises a day plus my walking.     Exercise/Treatments Stretches Prone on Elbows Stretch: 60 seconds Lumbar Exercises   Stability Large Ball Abdominal Isometric: 10 reps Large Ball Oblique Isometric: 10 reps Hip Abduction: Limitations Hip Abduction Limitations: prone chin tuck head lift x 10 Single Arm Raise: 10 reps Leg Raise: 10 reps Opposite Arm/Leg Raise: 10 reps Plank: wall push ups x 10 Forward Lunge: 10 reps Side Lunge: 10 reps   Physical Therapy Assessment and Plan PT Assessment and Plan Clinical Impression Statement: Pt continues to have good form with execises.  Adde forward lunge with some difficulty mainly due to balance; side lunges without difficulty, wall pushup with mod difficulty due to decreased core strength. Clinical Impairments Affecting Rehab Potential: strength and posture. PT Plan: reassess next week.  Begin stabilization sit to stand exericises next visit.    Goals  no pain; improved functional mobility.  Problem List Patient Active Problem List  Diagnoses  . Muscle weakness (generalized)  . Difficulty in walking    PT - End of Session Activity Tolerance: Patient tolerated treatment well General Behavior During Session: [facility] for tasks performed Cognition: [facility] for tasks performed  RUSSELL,CINDY 07/15/2011, 11:52 AM

## 2011-07-17 ENCOUNTER — Ambulatory Visit (HOSPITAL_COMMUNITY)
Admission: RE | Admit: 2011-07-17 | Discharge: 2011-07-17 | Disposition: A | Discharge status: Home / Self Care | Payer: Medicare Other | Source: Ambulatory Visit | Attending: Pulmonary Disease | Admitting: Pulmonary Disease

## 2011-07-17 NOTE — Progress Notes (Signed)
Physical Therapy Treatment Patient Details  Name: Felicia Wells MRN: [id number] Date of Birth: 1919/02/25  Today's Date: 07/17/2011 Time: 4132-4401 Time Calculation (min): 44 min Visit#: 14  of 15   Re-eval: 07/23/11 Charges: Therex x 42'  Subjective: Symptoms/Limitations Symptoms: No pain right now but I had some pain Tuesday when I was walking I should have stopped when it hurt but I kept going. Pain Assessment Currently in Pain?: No/denies Pain Score: 0-No pain   Exercise/Treatments Stability Large Ball Abdominal Isometric: 10 reps Large Ball Oblique Isometric: 10 reps Heel Squeeze: 15 reps;5 seconds Single Arm Raise: 10 reps Leg Raise: 10 reps Opposite Arm/Leg Raise: 10 reps Functional Squats: Limitations Functional Squats Limitations: sit to stand w/o UE assist x 5 Forward Lunge: 10 reps  Physical Therapy Assessment and Plan PT Assessment and Plan Clinical Impression Statement: Pt displays increased balance and stability walking into therapy today. Pt with good coordination with exercises. Pt requires minimal cueing for proper technique. PT Treatment/Interventions: Therapeutic exercise PT Plan: Reassess next session.     Problem List Patient Active Problem List  Diagnoses  . Muscle weakness (generalized)  . Difficulty in walking    PT - End of Session Activity Tolerance: Patient tolerated treatment well General Behavior During Session: [facility] for tasks performed Cognition: [facility] for tasks performed  Antonieta Iba 07/17/2011, 11:21 AM

## 2011-07-21 ENCOUNTER — Ambulatory Visit (HOSPITAL_COMMUNITY)
Admission: RE | Admit: 2011-07-21 | Discharge: 2011-07-21 | Disposition: A | Discharge status: Home / Self Care | Payer: Medicare Other | Source: Ambulatory Visit | Attending: Pulmonary Disease | Admitting: Pulmonary Disease

## 2011-07-21 DIAGNOSIS — M6281 Muscle weakness (generalized): Secondary | ICD-10-CM

## 2011-07-21 NOTE — Patient Instructions (Addendum)
Instructed on how to make and use a lumbar roll.  Emphasized the importance in prolong car riding,(Pt going to [place] for Thanksgiving).  Explained to walk 5x/week.  Strengthening exercises can be reduces to 2-3 times a week since patient is no longer having any pain.

## 2011-07-21 NOTE — Progress Notes (Signed)
Physical Therapy Reassessment Patient Details  Name: Felicia Wells MRN: [id number] Date of Birth: 1919/03/23  Today's Date: 07/21/2011 Time: 4098-1191 Time Calculation (min): 36 min Visit#: 15  of 15   Re-eval:  today  charge:  ROM, MMT education.  Past Medical History: No past medical history on file. Past Surgical History: No past surgical history on file.  Subjective Symptoms/Limitations Symptoms: The patient states that she is not in any pain today but after her Tue. and Thursday the next day she had some back pain.  How long can you sit comfortably?: Sitting is no problem and hasn't been any problems.  How long can you stand comfortably?: The patient states that she is able to stand an hour now.  Initial evaluation was aat 30 min.  Re-eval on 10/25 was an hour. How long can you walk comfortably?: Pt states that she has been able to walk at [facility] for about 30 minutes but she did have to take several breaks.  She is walking 17 min straight at home.  On reevaluation on 06/26/11 the patient was only able to walk for 4 min. Pain Assessment Currently in Pain?: No/denies  Objective: RLE Strength Right Hip Flexion: 5/5 Right Hip Extension: 5/5 Right Hip ABduction: 5/5 Right Hip ADduction: 5/5 Right Knee Flexion: 5/5 Right Knee Extension: 5/5 Right Ankle Dorsiflexion: 4/5 at re-eval on 10/25 LLE Strength Left Hip Flexion: 5/5 was 4/5 at re-eval on 10/25 Left Hip Extension: 5/5 was 4/5 at re-eval on 10/25 Left Hip ABduction: 5/5 Left Hip ADduction: 5/5 Left Knee Flexion: 5/5 was 4/5 at re-eval on 10/25 Left Knee Extension: 5/5 Left Ankle Dorsiflexion: 5/5 was 4/5 Lumbar AROM Lumbar Flexion: wfl was decreased 10% at re-eval on 10/25 Lumbar Extension: wfl Lumbar - Right Side Bend: wfl Lumbar - Left Side Bend: wfl Lumbar - Right Rotation: wfl Lumbar - Left Rotation: wfl Lumbar Strength Lumbar Flexion: 3+/5 was 3/5 Lumbar Extension: 3/5 was 3-/5   Physical Therapy  Assessment and Plan PT Assessment and Plan Clinical Impression Statement: Pt has met all goals PT Plan: D/C to HEP    Goals Home Exercise Program PT Goal: Perform Home Exercise Program - Progress: Met PT Short Term Goals PT Short Term Goal 1: No leg pain at all and only has some back pain after doing exercises. PT Short Term Goal 1 - Progress: Met PT Short Term Goal 2 - Progress: Met PT Long Term Goals PT Long Term Goal 2 - Progress: Met Long Term Goal 3 Progress: Progressing toward goal  Problem List Patient Active Problem List  Diagnoses  . Muscle weakness (generalized)  . Difficulty in walking    PT - End of Session Activity Tolerance: Patient tolerated treatment well General Behavior During Session: [facility] for tasks performed Cognition: [facility] for tasks performed   RUSSELL,CINDY 07/21/2011, 12:08 PM  Physician Documentation Your signature is required to indicate approval of the treatment plan as stated above.  Please sign and either send electronically or make a copy of this report for your files and return this physician signed original.   Please mark one 1.__approve of plan  2. ___approve of plan with the following conditions.   ______________________________  _____________________ Physician Signature                                                                                                             Date

## 2011-07-23 ENCOUNTER — Ambulatory Visit (HOSPITAL_COMMUNITY): Payer: Medicare Other

## 2011-09-23 ENCOUNTER — Other Ambulatory Visit (HOSPITAL_COMMUNITY): Payer: Self-pay | Admitting: Pulmonary Disease

## 2011-09-23 DIAGNOSIS — M7989 Other specified soft tissue disorders: Secondary | ICD-10-CM

## 2011-09-26 ENCOUNTER — Ambulatory Visit (HOSPITAL_COMMUNITY)
Admission: RE | Admit: 2011-09-26 | Discharge: 2011-09-26 | Disposition: A | Discharge status: Home / Self Care | Payer: Medicare Other | Source: Ambulatory Visit | Attending: Pulmonary Disease | Admitting: Pulmonary Disease

## 2011-09-26 DIAGNOSIS — I509 Heart failure, unspecified: Secondary | ICD-10-CM | POA: Insufficient documentation

## 2011-09-26 DIAGNOSIS — R5381 Other malaise: Secondary | ICD-10-CM | POA: Insufficient documentation

## 2011-09-26 DIAGNOSIS — R609 Edema, unspecified: Secondary | ICD-10-CM | POA: Insufficient documentation

## 2011-09-26 DIAGNOSIS — I517 Cardiomegaly: Secondary | ICD-10-CM

## 2011-09-26 DIAGNOSIS — I82819 Embolism and thrombosis of superficial veins of unspecified lower extremities: Secondary | ICD-10-CM | POA: Insufficient documentation

## 2011-09-26 DIAGNOSIS — M7989 Other specified soft tissue disorders: Secondary | ICD-10-CM | POA: Insufficient documentation

## 2011-09-26 NOTE — Progress Notes (Signed)
*  PRELIMINARY RESULTS* Echocardiogram 2D Echocardiogram has been performed.  [place] 09/26/2011, 11:18 AM

## 2011-10-06 ENCOUNTER — Emergency Department (HOSPITAL_COMMUNITY)
Admission: EM | Admit: 2011-10-06 | Discharge: 2011-10-06 | Disposition: A | Discharge status: Home / Self Care | Payer: Medicare Other | Attending: Emergency Medicine | Admitting: Emergency Medicine

## 2011-10-06 ENCOUNTER — Encounter (HOSPITAL_COMMUNITY): Payer: Self-pay

## 2011-10-06 DIAGNOSIS — F411 Generalized anxiety disorder: Secondary | ICD-10-CM | POA: Insufficient documentation

## 2011-10-06 DIAGNOSIS — Z7982 Long term (current) use of aspirin: Secondary | ICD-10-CM | POA: Insufficient documentation

## 2011-10-06 DIAGNOSIS — M810 Age-related osteoporosis without current pathological fracture: Secondary | ICD-10-CM | POA: Insufficient documentation

## 2011-10-06 DIAGNOSIS — I10 Essential (primary) hypertension: Secondary | ICD-10-CM | POA: Insufficient documentation

## 2011-10-06 DIAGNOSIS — Z79899 Other long term (current) drug therapy: Secondary | ICD-10-CM | POA: Insufficient documentation

## 2011-10-06 DIAGNOSIS — N390 Urinary tract infection, site not specified: Secondary | ICD-10-CM | POA: Insufficient documentation

## 2011-10-06 DIAGNOSIS — E871 Hypo-osmolality and hyponatremia: Secondary | ICD-10-CM | POA: Insufficient documentation

## 2011-10-06 HISTORY — DX: Essential (primary) hypertension: I10

## 2011-10-06 HISTORY — DX: Unspecified disorder of eye and adnexa: H57.9

## 2011-10-06 HISTORY — DX: Anxiety disorder, unspecified: F41.9

## 2011-10-06 HISTORY — DX: Renal tubulo-interstitial disease, unspecified: N15.9

## 2011-10-06 LAB — DIFFERENTIAL
Basophils Absolute: 0.1 10*3/uL (ref 0.0–0.1)
Eosinophils Relative: 3 % (ref 0–5)
Lymphocytes Relative: 19 % (ref 12–46)
Lymphs Abs: 1.4 10*3/uL (ref 0.7–4.0)
Neutro Abs: 4.7 10*3/uL (ref 1.7–7.7)
Neutrophils Relative %: 66 % (ref 43–77)

## 2011-10-06 LAB — BASIC METABOLIC PANEL
CO2: 29 mEq/L (ref 19–32)
Calcium: 9.9 mg/dL (ref 8.4–10.5)
Chloride: 87 mEq/L — ABNORMAL LOW (ref 96–112)
Potassium: 3.6 mEq/L (ref 3.5–5.1)
Sodium: 126 mEq/L — ABNORMAL LOW (ref 135–145)

## 2011-10-06 LAB — CBC
Platelets: 190 10*3/uL (ref 150–400)
RBC: 4.73 MIL/uL (ref 3.87–5.11)
RDW: 13.5 % (ref 11.5–15.5)
WBC: 7.1 10*3/uL (ref 4.0–10.5)

## 2011-10-06 LAB — URINALYSIS, ROUTINE W REFLEX MICROSCOPIC
Nitrite: NEGATIVE
Protein, ur: NEGATIVE mg/dL
Specific Gravity, Urine: 1.01 (ref 1.005–1.030)
Urobilinogen, UA: 0.2 mg/dL (ref 0.0–1.0)

## 2011-10-06 LAB — URINE MICROSCOPIC-ADD ON

## 2011-10-06 MED ORDER — CEPHALEXIN 500 MG PO CAPS: 500.0000 mg | ORAL_CAPSULE | Freq: Three times a day (TID) | ORAL | Status: AC

## 2011-10-06 MED ORDER — DEXTROSE 5 % IV SOLN: 1.0000 g | Freq: Once | INTRAVENOUS | Status: DC

## 2011-10-06 MED ORDER — CEFTRIAXONE SODIUM 1 G IJ SOLR
1.0000 g | Freq: Once | INTRAMUSCULAR | Status: AC
  Administered 2011-10-06: 1 g via INTRAVENOUS
  Filled 2011-10-06: qty 10

## 2011-10-06 NOTE — ED Provider Notes (Signed)
History     CSN: [id number]  Arrival date & time 10/06/11  1046   First MD Initiated Contact with Patient 10/06/11 1500      Chief Complaint  Patient presents with  . Urinary Tract Infection    (Consider location/radiation/quality/duration/timing/severity/associated sxs/prior treatment) The history is provided by the patient.  78 year old female comes in with a five-day history of subjective fever, chills and sweats. She still generally weak with this. She denies any nausea or vomiting or diarrhea, although she has had some slightly soft stools. She does not have any cough or sore throat. She has chronic GI urinary frequency which is unchanged. She denies any dysuria. She does have a history of frequent urinary tract infections and suspects that she may have another. Symptoms are moderate to severe. Nothing makes them better nothing makes them worse.  Past Medical History  Diagnosis Date  . Infections of kidney   . Hypertension   . Osteoporosis   . Eye problem   . Anxiety     Past Surgical History  Procedure Date  . Eye surgery   . Tonsillectomy   . Thyroid surgery   . Dilation and curettage of uterus   . Brain shunt   . Hernia repair   . Cataract extraction   . Foot surgery     No family history on file.  History  Substance Use Topics  . Smoking status: Never Smoker   . Smokeless tobacco: Not on file  . Alcohol Use: No    OB History    Grav Para Term Preterm Abortions TAB SAB Ect Mult Living                  Review of Systems  All other systems reviewed and are negative.    Allergies  Codeine; Darvocet; Demerol; Other; and Sulfa antibiotics  Home Medications   Current Outpatient Rx  Name Route Sig Dispense Refill  . ASPIRIN EC 81 MG PO TBEC Oral Take 81 mg by mouth daily.    Marland Kitchen VITAMIN B COMPLEX PO TABS Oral Take 1 tablet by mouth daily.    . OCUVITE PO TABS Oral Take 1 tablet by mouth daily.    Marland Kitchen CALCIUM CARBONATE 600 MG PO TABS Oral Take 600 mg by  mouth 2 (two) times daily with a meal.    . CHOLECALCIFEROL 400 UNITS PO TABS Oral Take 400 Units by mouth 2 (two) times daily.    Marland Kitchen DEXAMETHASONE 0.7 MG IO IMPL Intraocular Inject into the eye.    Marland Kitchen DIAZEPAM 2 MG PO TABS Oral Take 1 mg by mouth 2 (two) times daily.    . OMEGA-3 FATTY ACIDS 1000 MG PO CAPS Oral Take 1 g by mouth 2 (two) times daily.    Marland Kitchen HYDRALAZINE HCL 10 MG PO TABS Oral Take 10 mg by mouth 2 (two) times daily.    Marland Kitchen HYDROCHLOROTHIAZIDE 25 MG PO TABS Oral Take 25 mg by mouth daily.    . ADULT MULTIVITAMIN W/MINERALS CH Oral Take 1 tablet by mouth daily.    . NEBIVOLOL HCL 20 MG PO TABS Oral Take 1 tablet by mouth 2 (two) times daily.    Marland Kitchen POLYETHYL GLYCOL-PROPYL GLYCOL 0.4-0.3 % OP SOLN Ophthalmic Apply to eye.    Marland Kitchen POTASSIUM CHLORIDE CRYS ER 10 MEQ PO TBCR Oral Take 10 mEq by mouth 2 (two) times daily.    . TELMISARTAN 80 MG PO TABS Oral Take 80 mg by mouth daily at 12 noon.    Marland Kitchen  TIMOLOL HEMIHYDRATE 0.5 % OP SOLN Left Eye Place 1 drop into the left eye 2 (two) times daily.    . BENEFIBER PO POWD Oral Take 1 packet by mouth daily.      BP 176/72  Pulse 62  Temp(Src) 97.8 F (36.6 C) (Oral)  Resp 16  Ht 5\' 3"  (1.6 m)  Wt 135 lb (61.236 kg)  BMI 23.91 kg/m2  SpO2 95%  Physical Exam  Nursing note and vitals reviewed. 78 year old female is resting comfortably and in no acute distress. Vital signs are significant for mild systolic hypertension with blood pressure 136/68. Oxygen saturation is 97% which is normal. Head is normocephalic and atraumatic. PERRLA, EOMI. Oropharynx is clear. Neck is nontender and supple without adenopathy or JVD. Back is nontender. There's no CVA tenderness. Lungs are clear without rales, wheezes, or rhonchi. Heart is irregular without murmur. Abdomen is soft, flat, nontender without masses or hepatosplenomegaly. Extremities have full range of motion, no cyanosis or edema. Skin is warm and moist without rash. Neurologic: Mental status is normal,  cranial nerves are intact, there no focal motor or sensory deficits.  ED Course  Procedures (including critical care time)  Results for orders placed during the hospital encounter of 10/06/11  URINALYSIS, ROUTINE W REFLEX MICROSCOPIC      Component Value Range   Color, Urine YELLOW  YELLOW    APPearance CLEAR  CLEAR    Specific Gravity, Urine 1.010  1.005 - 1.030    pH 6.0  5.0 - 8.0    Glucose, UA NEGATIVE  NEGATIVE (mg/dL)   Hgb urine dipstick SMALL (*) NEGATIVE    Bilirubin Urine NEGATIVE  NEGATIVE    Ketones, ur NEGATIVE  NEGATIVE (mg/dL)   Protein, ur NEGATIVE  NEGATIVE (mg/dL)   Urobilinogen, UA 0.2  0.0 - 1.0 (mg/dL)   Nitrite NEGATIVE  NEGATIVE    Leukocytes, UA MODERATE (*) NEGATIVE   CBC      Component Value Range   WBC 7.1  4.0 - 10.5 (K/uL)   RBC 4.73  3.87 - 5.11 (MIL/uL)   Hemoglobin 14.4  12.0 - 15.0 (g/dL)   HCT 40.9  81.1 - 91.4 (%)   MCV 90.9  78.0 - 100.0 (fL)   MCH 30.4  26.0 - 34.0 (pg)   MCHC 33.5  30.0 - 36.0 (g/dL)   RDW 78.2  95.6 - 21.3 (%)   Platelets 190  150 - 400 (K/uL)  DIFFERENTIAL      Component Value Range   Neutrophils Relative 66  43 - 77 (%)   Neutro Abs 4.7  1.7 - 7.7 (K/uL)   Lymphocytes Relative 19  12 - 46 (%)   Lymphs Abs 1.4  0.7 - 4.0 (K/uL)   Monocytes Relative 11  3 - 12 (%)   Monocytes Absolute 0.8  0.1 - 1.0 (K/uL)   Eosinophils Relative 3  0 - 5 (%)   Eosinophils Absolute 0.2  0.0 - 0.7 (K/uL)   Basophils Relative 1  0 - 1 (%)   Basophils Absolute 0.1  0.0 - 0.1 (K/uL)  BASIC METABOLIC PANEL      Component Value Range   Sodium 126 (*) 135 - 145 (mEq/L)   Potassium 3.6  3.5 - 5.1 (mEq/L)   Chloride 87 (*) 96 - 112 (mEq/L)   CO2 29  19 - 32 (mEq/L)   Glucose, Bld 144 (*) 70 - 99 (mg/dL)   BUN 8  6 - 23 (mg/dL)   Creatinine, Ser 0.86  0.50 - 1.10 (mg/dL)   Calcium 9.9  8.4 - 40.9 (mg/dL)   GFR calc non Af Amer 79 (*) >90 (mL/min)   GFR calc Af Amer >90  >90 (mL/min)  URINE MICROSCOPIC-ADD ON      Component Value  Range   Squamous Epithelial / LPF FEW (*) RARE    WBC, UA TOO NUMEROUS TO COUNT  <3 (WBC/hpf)   RBC / HPF 11-20  <3 (RBC/hpf)   Bacteria, UA FEW (*) RARE      Urine is clearly positive for urinary tract infection, and will be sent for culture. She'll be given a dose of Rocephin intravenously. She does not appear toxic in any way. She is afebrile with normal WBC. Hyponatremia will need to be followed, but is not severe enough to require hospitalization.  1. Urinary tract infection   2. Hyponatremia       MDM  Weakness and fever and chills which are most likely related to urinary tract infection.        Dione Booze, MD 10/10/11 [phone]

## 2011-10-06 NOTE — ED Notes (Signed)
Thinks may have uti/kidney, same symptoms as last infection, weak when walking.  ?had flu last week

## 2011-10-06 NOTE — ED Notes (Signed)
Pt assisted to bathroom

## 2011-10-07 LAB — URINE CULTURE: Culture: NO GROWTH

## 2011-10-21 ENCOUNTER — Ambulatory Visit (INDEPENDENT_AMBULATORY_CARE_PROVIDER_SITE_OTHER): Payer: Medicare Other | Admitting: Urology

## 2011-10-21 DIAGNOSIS — R820 Chyluria: Secondary | ICD-10-CM

## 2011-10-21 DIAGNOSIS — N8111 Cystocele, midline: Secondary | ICD-10-CM

## 2011-10-21 DIAGNOSIS — R1032 Left lower quadrant pain: Secondary | ICD-10-CM

## 2011-10-21 DIAGNOSIS — R3915 Urgency of urination: Secondary | ICD-10-CM

## 2012-03-19 ENCOUNTER — Other Ambulatory Visit (HOSPITAL_COMMUNITY): Payer: Self-pay | Admitting: Pulmonary Disease

## 2012-03-19 DIAGNOSIS — Z139 Encounter for screening, unspecified: Secondary | ICD-10-CM

## 2012-03-22 ENCOUNTER — Other Ambulatory Visit (HOSPITAL_COMMUNITY): Payer: Self-pay | Admitting: Pulmonary Disease

## 2012-03-22 DIAGNOSIS — M810 Age-related osteoporosis without current pathological fracture: Secondary | ICD-10-CM

## 2012-04-19 ENCOUNTER — Ambulatory Visit (HOSPITAL_COMMUNITY)
Admission: RE | Admit: 2012-04-19 | Discharge: 2012-04-19 | Disposition: A | Discharge status: Home / Self Care | Payer: Medicare Other | Source: Ambulatory Visit | Attending: Pulmonary Disease | Admitting: Pulmonary Disease

## 2012-04-19 DIAGNOSIS — Z139 Encounter for screening, unspecified: Secondary | ICD-10-CM

## 2012-04-19 DIAGNOSIS — Z1231 Encounter for screening mammogram for malignant neoplasm of breast: Secondary | ICD-10-CM | POA: Insufficient documentation

## 2012-04-20 ENCOUNTER — Ambulatory Visit (INDEPENDENT_AMBULATORY_CARE_PROVIDER_SITE_OTHER): Payer: Medicare Other | Admitting: Urology

## 2012-04-20 DIAGNOSIS — N813 Complete uterovaginal prolapse: Secondary | ICD-10-CM

## 2012-05-31 ENCOUNTER — Ambulatory Visit (HOSPITAL_COMMUNITY)
Admission: RE | Admit: 2012-05-31 | Discharge: 2012-05-31 | Disposition: A | Discharge status: Home / Self Care | Payer: Medicare Other | Source: Ambulatory Visit | Attending: Pulmonary Disease | Admitting: Pulmonary Disease

## 2012-05-31 DIAGNOSIS — M810 Age-related osteoporosis without current pathological fracture: Secondary | ICD-10-CM

## 2012-06-07 ENCOUNTER — Ambulatory Visit (HOSPITAL_COMMUNITY)
Admission: RE | Admit: 2012-06-07 | Discharge: 2012-06-07 | Disposition: A | Discharge status: Home / Self Care | Payer: Medicare Other | Source: Ambulatory Visit | Attending: Pulmonary Disease | Admitting: Pulmonary Disease

## 2012-06-07 DIAGNOSIS — IMO0001 Reserved for inherently not codable concepts without codable children: Secondary | ICD-10-CM | POA: Insufficient documentation

## 2012-06-07 DIAGNOSIS — I10 Essential (primary) hypertension: Secondary | ICD-10-CM | POA: Insufficient documentation

## 2012-06-07 DIAGNOSIS — R1310 Dysphagia, unspecified: Secondary | ICD-10-CM | POA: Insufficient documentation

## 2012-06-07 NOTE — Progress Notes (Signed)
Clinical/Bedside Swallow Evaluation Patient Details  Name: Felicia Wells MRN: [id number] Date of Birth: February 11, 1919  Today's Date: 06/07/2012 Time:  - 11:35 am    Past Medical History:  Past Medical History  Diagnosis Date  . Infections of kidney   . Hypertension   . Osteoporosis   . Eye problem   . Anxiety    Past Surgical History:  Past Surgical History  Procedure Date  . Eye surgery   . Tonsillectomy   . Thyroid surgery   . Dilation and curettage of uterus   . Brain shunt   . Hernia repair   . Cataract extraction   . Foot surgery    HPI:  Symptoms/Limitations Symptoms: "Sometimes I strangle on liquids."  Prior Functional Status  Cognitive/Linguistic Baseline: Within functional limits Type of Home: House Lives With: Spouse Vocation: Retired  General  Date of Onset: 04/07/12 HPI: Mrs. Felicia Wells is a very active 78 yo female who was referred by Dr. Juanetta Gosling for a clinical swallow evaluation due to pt reports of coughing with thin liquids at times. Mrs. Felicia Wells lives at home with her husband and reports that a couple of months ago she started coughing at night and with liquids inconsistently. She denies GERD, but says that she occasionally feels "heartburn" but takes a TUMS which relieves symptoms. Type of Study: Bedside swallow evaluation Previous Swallow Assessment: None on record Diet Prior to this Study: Regular;Thin liquids Temperature Spikes Noted: No Respiratory Status: Room air History of Recent Intubation: No Behavior/Cognition: Alert;Cooperative;Pleasant mood Oral Cavity - Dentition: Adequate natural dentition Self-Feeding Abilities: Able to feed self Patient Positioning: Upright in chair Baseline Vocal Quality: Clear Volitional Cough: Strong Volitional Swallow: Able to elicit  Oral Motor/Sensory Function  Overall Oral Motor/Sensory Function: Appears within functional limits for tasks assessed Lingual Symmetry: Other (Comment) (slight deviation to  right with protrusion)  Consistency Results  Ice Chips Ice chips: Within functional limits Presentation: Spoon  Thin Liquid Thin Liquid: Within functional limits Presentation: Cup;Self Fed;Straw  Nectar Thick Liquid Nectar Thick Liquid: Not tested  Honey Thick Liquid Honey Thick Liquid: Not tested  Puree Puree: Within functional limits Presentation: Spoon;Self Fed  SLP Goals   N/A  Assessment/Plan  Patient Active Problem List  Diagnosis  . Muscle weakness (generalized)  . Difficulty in walking   SLP - End of Session Activity Tolerance: Patient tolerated treatment well General Behavior During Session: [facility] for tasks performed Cognition: [facility] for tasks performed  SLP Assessment/Plan Clinical Impression Statement: Pt shows no overt signs/symptoms aspiration during clinical swallow evaluation, however pt reports her coughing episodes are inconsistent with liquids at home. She has always been a slow eater and only has trouble with liquids (not solids). Her coughing episodes during the night may be related to reflux. She was advised to refrain from eating/drinking within three hours of lying down and to consider elevating the head of her bed. She says that she sleeps on a flat pillow due to her Meniere's disease. She is also encouraged to take small, single sips of liquids and avoid use of a straw. Mrs. Felicia Wells was advised to call me (and/or Dr. Juanetta Gosling) if her symptoms worsen and we can do MBSS if necessary. I do not feel it is indicated at this time. Mrs. Felicia Wells was agreeable to all suggestions and verbalized understanding.  Thank you,  Havery Moros, CCC-SLP [phone]  PORTER,DABNEY 06/07/2012,2:00 PM

## 2012-07-18 ENCOUNTER — Emergency Department (HOSPITAL_COMMUNITY): Payer: Medicare Other

## 2012-07-18 ENCOUNTER — Encounter (HOSPITAL_COMMUNITY): Payer: Self-pay

## 2012-07-18 ENCOUNTER — Inpatient Hospital Stay (HOSPITAL_COMMUNITY)
Admission: EM | Admit: 2012-07-18 | Discharge: 2012-07-22 | DRG: 536 | Disposition: A | Discharge status: Skilled Nursing Facility | Payer: Medicare Other | Attending: Pulmonary Disease | Admitting: Pulmonary Disease

## 2012-07-18 DIAGNOSIS — H8109 Meniere's disease, unspecified ear: Secondary | ICD-10-CM | POA: Diagnosis present

## 2012-07-18 DIAGNOSIS — Z79899 Other long term (current) drug therapy: Secondary | ICD-10-CM

## 2012-07-18 DIAGNOSIS — J209 Acute bronchitis, unspecified: Secondary | ICD-10-CM | POA: Diagnosis not present

## 2012-07-18 DIAGNOSIS — Z7982 Long term (current) use of aspirin: Secondary | ICD-10-CM

## 2012-07-18 DIAGNOSIS — Z882 Allergy status to sulfonamides status: Secondary | ICD-10-CM

## 2012-07-18 DIAGNOSIS — F411 Generalized anxiety disorder: Secondary | ICD-10-CM | POA: Diagnosis present

## 2012-07-18 DIAGNOSIS — M810 Age-related osteoporosis without current pathological fracture: Secondary | ICD-10-CM | POA: Diagnosis present

## 2012-07-18 DIAGNOSIS — Z885 Allergy status to narcotic agent status: Secondary | ICD-10-CM

## 2012-07-18 DIAGNOSIS — Z982 Presence of cerebrospinal fluid drainage device: Secondary | ICD-10-CM

## 2012-07-18 DIAGNOSIS — Y92009 Unspecified place in unspecified non-institutional (private) residence as the place of occurrence of the external cause: Secondary | ICD-10-CM

## 2012-07-18 DIAGNOSIS — M6281 Muscle weakness (generalized): Secondary | ICD-10-CM

## 2012-07-18 DIAGNOSIS — W19XXXA Unspecified fall, initial encounter: Secondary | ICD-10-CM

## 2012-07-18 DIAGNOSIS — S32409A Unspecified fracture of unspecified acetabulum, initial encounter for closed fracture: Principal | ICD-10-CM | POA: Diagnosis present

## 2012-07-18 DIAGNOSIS — I10 Essential (primary) hypertension: Secondary | ICD-10-CM | POA: Diagnosis present

## 2012-07-18 DIAGNOSIS — W010XXA Fall on same level from slipping, tripping and stumbling without subsequent striking against object, initial encounter: Secondary | ICD-10-CM | POA: Diagnosis present

## 2012-07-18 DIAGNOSIS — M25551 Pain in right hip: Secondary | ICD-10-CM

## 2012-07-18 LAB — CBC
HCT: 41.7 % (ref 36.0–46.0)
MCHC: 33.8 g/dL (ref 30.0–36.0)
RDW: 13.9 % (ref 11.5–15.5)

## 2012-07-18 LAB — COMPREHENSIVE METABOLIC PANEL
Albumin: 3.5 g/dL (ref 3.5–5.2)
Alkaline Phosphatase: 70 U/L (ref 39–117)
BUN: 14 mg/dL (ref 6–23)
Potassium: 4.2 mEq/L (ref 3.5–5.1)
Total Protein: 6.9 g/dL (ref 6.0–8.3)

## 2012-07-18 MED ORDER — ACETAMINOPHEN 325 MG PO TABS: 650.0000 mg | ORAL_TABLET | Freq: Four times a day (QID) | ORAL | Status: DC | PRN

## 2012-07-18 MED ORDER — ENOXAPARIN SODIUM 30 MG/0.3ML ~~LOC~~ SOLN
30.0000 mg | SUBCUTANEOUS | Status: DC
  Administered 2012-07-19 – 2012-07-22 (×4): 30 mg via SUBCUTANEOUS
  Filled 2012-07-18 (×4): qty 0.3

## 2012-07-18 MED ORDER — HYDROMORPHONE HCL PF 1 MG/ML IJ SOLN
0.5000 mg | INTRAMUSCULAR | Status: DC | PRN
  Administered 2012-07-19: 0.5 mg via INTRAVENOUS
  Filled 2012-07-18: qty 1

## 2012-07-18 MED ORDER — ONDANSETRON 8 MG PO TBDP
8.0000 mg | ORAL_TABLET | Freq: Once | ORAL | Status: AC
  Administered 2012-07-18: 8 mg via ORAL
  Filled 2012-07-18: qty 1

## 2012-07-18 MED ORDER — NEBIVOLOL HCL 10 MG PO TABS
10.0000 mg | ORAL_TABLET | Freq: Two times a day (BID) | ORAL | Status: DC
  Administered 2012-07-20 – 2012-07-22 (×5): 10 mg via ORAL
  Filled 2012-07-18 (×10): qty 1

## 2012-07-18 MED ORDER — ONDANSETRON HCL 4 MG PO TABS: 4.0000 mg | ORAL_TABLET | Freq: Four times a day (QID) | ORAL | Status: DC | PRN

## 2012-07-18 MED ORDER — IRBESARTAN 300 MG PO TABS
300.0000 mg | ORAL_TABLET | Freq: Every day | ORAL | Status: DC
  Administered 2012-07-19 – 2012-07-22 (×3): 300 mg via ORAL
  Filled 2012-07-18 (×6): qty 1

## 2012-07-18 MED ORDER — HYDRALAZINE HCL 10 MG PO TABS
10.0000 mg | ORAL_TABLET | Freq: Two times a day (BID) | ORAL | Status: DC
  Filled 2012-07-18 (×6): qty 1

## 2012-07-18 MED ORDER — SODIUM CHLORIDE 0.9 % IJ SOLN: 3.0000 mL | INTRAMUSCULAR | Status: DC | PRN

## 2012-07-18 MED ORDER — ACETAMINOPHEN 650 MG RE SUPP: 650.0000 mg | Freq: Four times a day (QID) | RECTAL | Status: DC | PRN

## 2012-07-18 MED ORDER — ONDANSETRON HCL 4 MG/2ML IJ SOLN
4.0000 mg | Freq: Four times a day (QID) | INTRAMUSCULAR | Status: DC | PRN
  Administered 2012-07-19 (×2): 4 mg via INTRAVENOUS
  Filled 2012-07-18 (×2): qty 2

## 2012-07-18 MED ORDER — HYDROMORPHONE HCL PF 2 MG/ML IJ SOLN
2.0000 mg | Freq: Once | INTRAMUSCULAR | Status: AC
  Administered 2012-07-18: 2 mg via INTRAMUSCULAR
  Filled 2012-07-18: qty 1

## 2012-07-18 MED ORDER — TIMOLOL MALEATE 0.5 % OP SOLN
2.0000 [drp] | Freq: Two times a day (BID) | OPHTHALMIC | Status: DC
  Administered 2012-07-19 – 2012-07-22 (×7): 2 [drp] via OPHTHALMIC
  Filled 2012-07-18 (×2): qty 5

## 2012-07-18 MED ORDER — SODIUM CHLORIDE 0.9 % IV SOLN: 250.0000 mL | INTRAVENOUS | Status: DC | PRN

## 2012-07-18 MED ORDER — SODIUM CHLORIDE 0.9 % IJ SOLN
3.0000 mL | Freq: Two times a day (BID) | INTRAMUSCULAR | Status: DC
  Administered 2012-07-19 – 2012-07-21 (×6): 3 mL via INTRAVENOUS

## 2012-07-18 MED ORDER — ALUM & MAG HYDROXIDE-SIMETH 200-200-20 MG/5ML PO SUSP: 30.0000 mL | Freq: Four times a day (QID) | ORAL | Status: DC | PRN

## 2012-07-18 NOTE — ED Provider Notes (Addendum)
History     CSN: [id number]  Arrival date & time 07/18/12  1904   First MD Initiated Contact with Patient 07/18/12 2015      Chief Complaint  Patient presents with  . Fall  . Hip Pain    (Consider location/radiation/quality/duration/timing/severity/associated sxs/prior treatment) The history is provided by the patient, the spouse, a relative and the EMS personnel.   patient is a 77 year old female tripped at home over a stool fell onto her right side resulting in pain to her right hip right elbow and mild pain to her right shoulder. The right hip pain was the most severe it difficult for her to get up she needed assistance to get into a chair pain persisted but was not able to walk out of the house so EMS was called to bring her in. No loss of consciousness no injury to the head. The right hip pain at worst was 10 out of 10 made worse by trying to weight-bear on it. Pain radiated to her back of her right hip and part way down her right leg. No midline back pain. No bowel pain no chest pain. Pain described as sharp.  Past Medical History  Diagnosis Date  . Infections of kidney   . Hypertension   . Osteoporosis   . Eye problem   . Anxiety     Past Surgical History  Procedure Date  . Eye surgery   . Tonsillectomy   . Thyroid surgery   . Dilation and curettage of uterus   . Brain shunt   . Hernia repair   . Cataract extraction   . Foot surgery     History reviewed. No pertinent family history.  History  Substance Use Topics  . Smoking status: Never Smoker   . Smokeless tobacco: Not on file  . Alcohol Use: No    OB History    Grav Para Term Preterm Abortions TAB SAB Ect Mult Living                  Review of Systems  Constitutional: Negative for fever.  HENT: Negative for neck pain.   Eyes: Negative for redness.  Respiratory: Negative for shortness of breath.   Cardiovascular: Negative for chest pain.  Gastrointestinal: Negative for nausea, vomiting and  abdominal pain.  Genitourinary: Negative for dysuria.  Musculoskeletal: Negative for back pain.  Skin: Negative for rash.  Neurological: Negative for headaches.  Hematological: Does not bruise/bleed easily.    Allergies  Codeine; Darvocet; Demerol; Other; and Sulfa antibiotics  Home Medications   No current outpatient prescriptions on file.  BP 127/78  Pulse 76  Temp 98 F (36.7 C) (Oral)  Resp 16  Ht 5\' 3"  (1.6 m)  Wt 130 lb 4.7 oz (59.1 kg)  BMI 23.08 kg/m2  SpO2 88%  Physical Exam  Nursing note and vitals reviewed. Constitutional: She is oriented to person, place, and time. She appears well-developed and well-nourished.  HENT:  Head: Normocephalic and atraumatic.  Mouth/Throat: Oropharynx is clear and moist.  Eyes: Conjunctivae normal and EOM are normal. Pupils are equal, round, and reactive to light.  Neck: Normal range of motion. Neck supple.  Cardiovascular: Normal rate, regular rhythm and normal heart sounds.   Pulmonary/Chest: Effort normal and breath sounds normal.  Abdominal: Soft. Bowel sounds are normal. There is no tenderness.  Musculoskeletal: She exhibits tenderness. She exhibits no edema.       Patient with contusion to the right elbow no significant swelling near the contusion  measures probably about 10 cm. Patient had good range of motion at the right shoulder no deformities no evidence of forearm fracture. Radial pulses 2+. Patient has tenderness in the right hip area and right groin area. No deformity no leg shortening the distal femur tenderness no knee tenderness. Sensation intact distally good cap refill.  Neurological: She is alert and oriented to person, place, and time. No cranial nerve deficit. She exhibits normal muscle tone. Coordination normal.  Skin: Skin is warm. No rash noted. She is not diaphoretic.    ED Course  Procedures (including critical care time)  Labs Reviewed  CBC - Abnormal; Notable for the following:    WBC 15.2 (*)     All  other components within normal limits  COMPREHENSIVE METABOLIC PANEL - Abnormal; Notable for the following:    Sodium 130 (*)     Chloride 93 (*)     Glucose, Bld 151 (*)     GFR calc non Af Amer 77 (*)     GFR calc Af Amer 90 (*)     All other components within normal limits   Dg Hip Complete Right  07/18/2012  *RADIOLOGY REPORT*  Clinical Data: Right groin pain and hip pain.  Fall today.  RIGHT HIP - COMPLETE 2+ VIEW  Comparison: None.  Findings: Pelvic rings are intact.  Hip joint spaces are symmetric. There is no femoral fracture.  Sacral arcades obscured by overlying bowel gas.  L4-L5 and L5-S1 spondylosis.  Trabecular markings in the right hip appear within normal limits.  IMPRESSION: Negative.   Original Report Authenticated By: Andreas Newport, M.D.    Ct Hip Right Wo Contrast  07/18/2012  *RADIOLOGY REPORT*  Clinical Data: Right hip pain post fall  CT OF THE RIGHT HIP WITHOUT CONTRAST  Technique:  Multidetector CT imaging was performed according to the standard protocol. Multiplanar CT image reconstructions were also generated.  Comparison: Right hip radiographs 07/18/2012  Findings: Osseous demineralization. Bone island right femoral head. No femoral fracture or dislocation. Subchondral cyst formation right acetabular roof. Nondisplaced fracture and anterior column right acetabulum. Degenerative disc and facet disease changes lower lumbar spine. Right SI joint preserved. No additional fracture identified. Low position of right lobe liver and gallbladder extending into upper right pelvis. Pessary inferiorly within pelvis. Scattered atherosclerotic calcifications.  IMPRESSION: No proximal right femoral fracture identified. Nondisplaced fracture at anterior column right acetabulum. Degenerative changes right hip joint with subchondral cyst formation at right acetabular roof.   Original Report Authenticated By: Ulyses Southward, M.D.    Results for orders placed during the hospital encounter of  07/18/12  CBC      Component Value Range   WBC 15.2 (*) 4.0 - 10.5 K/uL   RBC 4.37  3.87 - 5.11 MIL/uL   Hemoglobin 14.1  12.0 - 15.0 g/dL   HCT 32.4  40.1 - 02.7 %   MCV 95.4  78.0 - 100.0 fL   MCH 32.3  26.0 - 34.0 pg   MCHC 33.8  30.0 - 36.0 g/dL   RDW 25.3  66.4 - 40.3 %   Platelets 204  150 - 400 K/uL  COMPREHENSIVE METABOLIC PANEL      Component Value Range   Sodium 130 (*) 135 - 145 mEq/L   Potassium 4.2  3.5 - 5.1 mEq/L   Chloride 93 (*) 96 - 112 mEq/L   CO2 29  19 - 32 mEq/L   Glucose, Bld 151 (*) 70 - 99 mg/dL   BUN 14  6 - 23 mg/dL   Creatinine, Ser 3.24  0.50 - 1.10 mg/dL   Calcium 8.8  8.4 - 40.1 mg/dL   Total Protein 6.9  6.0 - 8.3 g/dL   Albumin 3.5  3.5 - 5.2 g/dL   AST 32  0 - 37 U/L   ALT 26  0 - 35 U/L   Alkaline Phosphatase 70  39 - 117 U/L   Total Bilirubin 0.9  0.3 - 1.2 mg/dL   GFR calc non Af Amer 77 (*) >90 mL/min   GFR calc Af Amer 90 (*) >90 mL/min     1. Hip pain, right   2. Fall       MDM  Patient tripped on a stool in her home and fell on her right side resulting in pain to the right hip also bruising to the right elbow and some mild discomfort to the right shoulder. No loss of consciousness. Patient did not hit her head. The right hip pain was severe enough she can be back on her feet family members helped her to a chair pain continued and she was not able to walk out of the house to get into the car she was brought in by EMS.  Plain x-rays of the right hip show no evidence of the pubic ramus fracture or hip fracture. This has been read by radiology. Patient still has severe pain in the area. We'll get CT of the head to rule out any hairline fractures and treat with pain medication. Patient is not able to walk better with pain medication she may require admission or if she has too much trouble with pain medication in the past as cause a lot of vomiting so we did give her Zofran. If fracture is seen she will require admission and will require  additional labs.  After being in the ED the area of the right elbow and right shoulder have good range of motion and feels better patient's not concerned about injuries to those area.        Shelda Jakes, MD 07/18/12 2157  Addendum: Acetabular fracture noted on CT Scan of right hip explaining pain. Admit labs ordered by Dr Estell Harpin.   Shelda Jakes, MD 07/19/12 [phone]

## 2012-07-18 NOTE — ED Notes (Signed)
Pt states she tripped on a stool in her home and fell onto her right side, pain to right hip.  Pt also with some discomfort in right shoulder.

## 2012-07-19 ENCOUNTER — Encounter (HOSPITAL_COMMUNITY): Payer: Self-pay | Admitting: *Deleted

## 2012-07-19 DIAGNOSIS — I10 Essential (primary) hypertension: Secondary | ICD-10-CM | POA: Diagnosis present

## 2012-07-19 DIAGNOSIS — H8109 Meniere's disease, unspecified ear: Secondary | ICD-10-CM | POA: Diagnosis present

## 2012-07-19 DIAGNOSIS — S32409A Unspecified fracture of unspecified acetabulum, initial encounter for closed fracture: Secondary | ICD-10-CM | POA: Diagnosis present

## 2012-07-19 DIAGNOSIS — M25559 Pain in unspecified hip: Secondary | ICD-10-CM

## 2012-07-19 LAB — MRSA PCR SCREENING: MRSA by PCR: NEGATIVE

## 2012-07-19 MED ORDER — DIAZEPAM 2 MG PO TABS
2.0000 mg | ORAL_TABLET | Freq: Two times a day (BID) | ORAL | Status: DC
  Administered 2012-07-19 – 2012-07-20 (×2): 2 mg via ORAL
  Filled 2012-07-19 (×3): qty 1

## 2012-07-19 NOTE — Progress Notes (Signed)
UR Chart Review Completed  

## 2012-07-19 NOTE — Consult Note (Signed)
Reason for Consult: fracture right acetabulum  Referring Physician: Dr. Amada Kingfisher is an 78 y.o. female.  HPI:   78 years old, fell at home and complains of nonradiating, severe, aching, deep RIGHT groin pain could not walk brought into the emergency room, and, then admitted for CT scan diagnosed anterior column acetabular fracture, nondisplaced.  Past Medical History  Diagnosis Date  . Infections of kidney   . Hypertension   . Osteoporosis   . Eye problem   . Anxiety     Past Surgical History  Procedure Date  . Eye surgery   . Tonsillectomy   . Thyroid surgery   . Dilation and curettage of uterus   . Brain shunt   . Hernia repair   . Cataract extraction   . Foot surgery     History reviewed. No pertinent family history.  Social History:  reports that she has never smoked. She does not have any smokeless tobacco history on file. She reports that she does not drink alcohol or use illicit drugs.  Allergies:  Allergies  Allergen Reactions  . Codeine Nausea Only  . Darvocet (Propoxyphene-Acetaminophen) Nausea Only  . Demerol Nausea Only  . Other Other (See Comments)    Sensitive to Cholesterol medications and blood pressure medications , possible fluid retention  . Sulfa Antibiotics Rash    Medications: I have reviewed the patient's current medications.  Results for orders placed during the hospital encounter of 07/18/12 (from the past 48 hour(s))  CBC     Status: Abnormal   Collection Time   07/18/12 11:27 PM      Component Value Range Comment   WBC 15.2 (*) 4.0 - 10.5 K/uL    RBC 4.37  3.87 - 5.11 MIL/uL    Hemoglobin 14.1  12.0 - 15.0 g/dL    HCT 53.6  64.4 - 03.4 %    MCV 95.4  78.0 - 100.0 fL    MCH 32.3  26.0 - 34.0 pg    MCHC 33.8  30.0 - 36.0 g/dL    RDW 74.2  59.5 - 63.8 %    Platelets 204  150 - 400 K/uL   COMPREHENSIVE METABOLIC PANEL     Status: Abnormal   Collection Time   07/18/12 11:27 PM      Component Value Range Comment   Sodium 130 (*) 135 - 145 mEq/L    Potassium 4.2  3.5 - 5.1 mEq/L    Chloride 93 (*) 96 - 112 mEq/L    CO2 29  19 - 32 mEq/L    Glucose, Bld 151 (*) 70 - 99 mg/dL    BUN 14  6 - 23 mg/dL    Creatinine, Ser 7.56  0.50 - 1.10 mg/dL    Calcium 8.8  8.4 - 43.3 mg/dL    Total Protein 6.9  6.0 - 8.3 g/dL    Albumin 3.5  3.5 - 5.2 g/dL    AST 32  0 - 37 U/L    ALT 26  0 - 35 U/L    Alkaline Phosphatase 70  39 - 117 U/L    Total Bilirubin 0.9  0.3 - 1.2 mg/dL    GFR calc non Af Amer 77 (*) >90 mL/min    GFR calc Af Amer 90 (*) >90 mL/min   MRSA PCR SCREENING     Status: Normal   Collection Time   07/19/12 12:20 AM      Component Value Range Comment  MRSA by PCR NEGATIVE  NEGATIVE     Dg Hip Complete Right  07/18/2012  *RADIOLOGY REPORT*  Clinical Data: Right groin pain and hip pain.  Fall today.  RIGHT HIP - COMPLETE 2+ VIEW  Comparison: None.  Findings: Pelvic rings are intact.  Hip joint spaces are symmetric. There is no femoral fracture.  Sacral arcades obscured by overlying bowel gas.  L4-L5 and L5-S1 spondylosis.  Trabecular markings in the right hip appear within normal limits.  IMPRESSION: Negative.   Original Report Authenticated By: Andreas Newport, M.D.    Ct Hip Right Wo Contrast  07/18/2012  *RADIOLOGY REPORT*  Clinical Data: Right hip pain post fall  CT OF THE RIGHT HIP WITHOUT CONTRAST  Technique:  Multidetector CT imaging was performed according to the standard protocol. Multiplanar CT image reconstructions were also generated.  Comparison: Right hip radiographs 07/18/2012  Findings: Osseous demineralization. Bone island right femoral head. No femoral fracture or dislocation. Subchondral cyst formation right acetabular roof. Nondisplaced fracture and anterior column right acetabulum. Degenerative disc and facet disease changes lower lumbar spine. Right SI joint preserved. No additional fracture identified. Low position of right lobe liver and gallbladder extending into upper right  pelvis. Pessary inferiorly within pelvis. Scattered atherosclerotic calcifications.  IMPRESSION: No proximal right femoral fracture identified. Nondisplaced fracture at anterior column right acetabulum. Degenerative changes right hip joint with subchondral cyst formation at right acetabular roof.   Original Report Authenticated By: Ulyses Southward, M.D.     ROS Blood pressure 139/81, pulse 72, temperature 98.9 F (37.2 C), temperature source Oral, resp. rate 18, height 5\' 3"  (1.6 m), weight 59.6 kg (131 lb 6.3 oz), SpO2 93.00%. Physical Exam  Assessment/Plan: RIGHT acetabular anterior column fracture, nondisplaced.  Recommended treatment, and weightbearing as tolerated with protected weightbearing for 6 weeks. Using a walker and physical therapy. Occupational therapy physical therapy assessment to determine discharge plan  Fuller Canada 07/19/2012, 12:35 PM

## 2012-07-19 NOTE — Progress Notes (Signed)
PT BEING TRANSFERRED TO ROOM 320. ALERT AND ORIENTED. NSL PATENT. DENIES ANY DISCOMFORT EXCEPT WHEN MOVING AROUND IN BED WHICH CAUSES RT HIP PAIN. DAUGHTER AT BEDSIDE. REPORT CALLED TO LISA COVEINGTON RN ON 300.

## 2012-07-19 NOTE — Progress Notes (Signed)
Patient ID: Felicia Wells, female   DOB: Jun 14, 1919, 78 y.o.   MRN: [id number]  This is an addendum to the consult dictated earlier Physical Exam  Nursing note and vitals reviewed. Constitutional: She is oriented to person, place, and time. She appears well-developed and well-nourished. No distress.  HENT:  Head: Normocephalic.  Eyes: Right eye exhibits no discharge. Left eye exhibits no discharge.  Neck: Neck supple. No JVD present.  Cardiovascular: Normal rate and intact distal pulses.   Pulmonary/Chest: Effort normal. No stridor.  Abdominal: She exhibits no distension.  Musculoskeletal:       LEFT hip and RIGHT hip exam.  Leg lengths were equal.  No tenderness, pain or swelling in the LEFT lower extremity. Range of motion was normal stability and strength are normal. Skin was intact.  RIGHT leg painful roll maneuver. Groin pain elicited. Muscle tone normal. Stability normal. Skin normal. No swelling.    Lymphadenopathy:    She has no cervical adenopathy.  Neurological: She is alert and oriented to person, place, and time. She has normal reflexes.  Skin: Skin is warm and dry.  Psychiatric: She has a normal mood and affect. Her behavior is normal. Judgment and thought content normal.

## 2012-07-19 NOTE — Care Management Note (Signed)
    Page 1 of 1   07/22/2012     11:49:41 AM   CARE MANAGEMENT NOTE 07/22/2012  Patient:  Felicia Wells, Felicia Wells   Account Number:  [id number]  Date Initiated:  07/19/2012  Documentation initiated by:  Sharrie Rothman  Subjective/Objective Assessment:   Pt admitted from home with acetabular fx. Pt lives with husband and may need SNF at discharge. Pt has walkers at home but has been independent prior to admission. Pt is agreeable to [facility] if SNF not recommended.     Action/Plan:   CSW is aware of potential for placement. Will arrange [facility] and DME if going home at discharge.   Anticipated DC Date:  07/22/2012   Anticipated DC Plan:  HOME W HOME HEALTH SERVICES  In-house referral  Clinical Social Worker      DC Planning Services  CM consult      Choice offered to / List presented to:             Status of service:  Completed, signed off Medicare Important Message given?  YES (If response is "NO", the following Medicare IM given date fields will be blank) Date Medicare IM given:  07/21/2012 Date Additional Medicare IM given:    Discharge Disposition:  SKILLED NURSING FACILITY  Per UR Regulation:    If discussed at Long Length of Stay Meetings, dates discussed:    Comments:  07/22/12 1150 Arlyss Queen, RN BSN CM Pt discharged to [facility] today with IV AB. CSW will arrange discharge to facility.  07/19/12 1545 Arlyss Queen, RN BSN CM

## 2012-07-19 NOTE — Progress Notes (Signed)
Pt's fa ily member brought in her meds from home and pt took am dose of bystolic 10mg  abd hydrodiuril 10mg  . Son told nurse when she came back to room. Family informed of hospital policy that pt only take meds given by nursing staff that md orders for inpatient. Asked family to take all pt's meds home. And pt not to take any more home  meds.

## 2012-07-19 NOTE — H&P (Signed)
Felicia Wells MRN: [id number] DOB/AGE: 09-Jul-1919 78 y.o. Primary Care Physician:Zaynab Chipman L, MD Admit date: 07/18/2012 Chief Complaint: Hip pain HPI: This is a 78 year old who had been in her usual state of health when she tripped over a stool at home. She developed pain in her right hip also had pain in her right elbow and right shoulder. She said that she tried to break her fall with her arm. She tried to get up and was unable to bear weight so EMS was called. When she was taken to the emergency room she was found to have a fracture in the acetabulum the pain was rated a 10 out of 10 but is better. She's having nausea associated with her medication for pain.  Past Medical History  Diagnosis Date  . Infections of kidney   . Hypertension   . Osteoporosis   . Eye problem   . Anxiety    Past Surgical History  Procedure Date  . Eye surgery   . Tonsillectomy   . Thyroid surgery   . Dilation and curettage of uterus   . Brain shunt   . Hernia repair   . Cataract extraction   . Foot surgery         History reviewed. No pertinent family history.  Social History:  reports that she has never smoked. She does not have any smokeless tobacco history on file. She reports that she does not drink alcohol or use illicit drugs.   Allergies:  Allergies  Allergen Reactions  . Codeine Nausea Only  . Darvocet (Propoxyphene-Acetaminophen) Nausea Only  . Demerol Nausea Only  . Other Other (See Comments)    Sensitive to Cholesterol medications and blood pressure medications , possible fluid retention  . Sulfa Antibiotics Rash    Medications Prior to Admission  Medication Sig Dispense Refill  . aspirin EC 81 MG tablet Take 81 mg by mouth daily.      . B Complex Vitamins (VITAMIN B COMPLEX) TABS Take 1 tablet by mouth daily.      . beta carotene w/minerals (OCUVITE) tablet Take 1 tablet by mouth daily.      . calcium carbonate (OS-CAL) 600 MG TABS Take 600 mg by mouth 2 (two) times  daily with a meal.      . cholecalciferol (VITAMIN D) 400 UNITS TABS Take 400 Units by mouth 2 (two) times daily.      . diazepam (VALIUM) 2 MG tablet Take 1 mg by mouth daily.       . fish oil-omega-3 fatty acids 1000 MG capsule Take 1 g by mouth 2 (two) times daily.      . hydrALAZINE (APRESOLINE) 10 MG tablet Take 10 mg by mouth 2 (two) times daily.      . hydrochlorothiazide (HYDRODIURIL) 25 MG tablet Take 25 mg by mouth daily.      . Multiple Vitamin (MULITIVITAMIN WITH MINERALS) TABS Take 1 tablet by mouth daily.      . nebivolol (BYSTOLIC) 10 MG tablet Take 10 mg by mouth 2 (two) times daily.      Marland Kitchen telmisartan (MICARDIS) 80 MG tablet Take 80 mg by mouth daily at 12 noon.      . timolol (BETIMOL) 0.5 % ophthalmic solution Place 1 drop into the left eye 2 (two) times daily.      . Wheat Dextrin (BENEFIBER) POWD Take 1 packet by mouth daily.      Marland Kitchen Dexamethasone (OZURDEX) 0.7 MG IMPL Inject into the eye.  AOZ:HYQMV from the symptoms mentioned above,there are no other symptoms referable to all systems reviewed.  Physical Exam: Blood pressure 139/81, pulse 72, temperature 100.1 F (37.8 C), temperature source Oral, resp. rate 18, height 5\' 3"  (1.6 m), weight 59.1 kg (130 lb 4.7 oz), SpO2 93.00%. She is awake and alert. Her pupils are reactive. Her nose and throat clear. Tympanic membranes are intact. Her neck is supple without masses. Her chest is clear. Her heart is regular. Her abdomen is soft. Extremities showed no edema. She has a bruise on her arm and hip. I did not attempt range of motion of her hip. Her central nervous system exam is grossly intact    Basename 07/18/12 2327  WBC 15.2*  NEUTROABS --  HGB 14.1  HCT 41.7  MCV 95.4  PLT 204    Basename 07/18/12 2327  NA 130*  K 4.2  CL 93*  CO2 29  GLUCOSE 151*  BUN 14  CREATININE 0.60  CALCIUM 8.8  MG --  lablast2(ast:2,ALT:2,alkphos:2,bilitot:2,prot:2,albumin:2)@    Recent Results (from the past 240  hour(s))  MRSA PCR SCREENING     Status: Normal   Collection Time   07/19/12 12:20 AM      Component Value Range Status Comment   MRSA by PCR NEGATIVE  NEGATIVE Final      Dg Hip Complete Right  07/18/2012  *RADIOLOGY REPORT*  Clinical Data: Right groin pain and hip pain.  Fall today.  RIGHT HIP - COMPLETE 2+ VIEW  Comparison: None.  Findings: Pelvic rings are intact.  Hip joint spaces are symmetric. There is no femoral fracture.  Sacral arcades obscured by overlying bowel gas.  L4-L5 and L5-S1 spondylosis.  Trabecular markings in the right hip appear within normal limits.  IMPRESSION: Negative.   Original Report Authenticated By: Andreas Newport, M.D.    Ct Hip Right Wo Contrast  07/18/2012  *RADIOLOGY REPORT*  Clinical Data: Right hip pain post fall  CT OF THE RIGHT HIP WITHOUT CONTRAST  Technique:  Multidetector CT imaging was performed according to the standard protocol. Multiplanar CT image reconstructions were also generated.  Comparison: Right hip radiographs 07/18/2012  Findings: Osseous demineralization. Bone island right femoral head. No femoral fracture or dislocation. Subchondral cyst formation right acetabular roof. Nondisplaced fracture and anterior column right acetabulum. Degenerative disc and facet disease changes lower lumbar spine. Right SI joint preserved. No additional fracture identified. Low position of right lobe liver and gallbladder extending into upper right pelvis. Pessary inferiorly within pelvis. Scattered atherosclerotic calcifications.  IMPRESSION: No proximal right femoral fracture identified. Nondisplaced fracture at anterior column right acetabulum. Degenerative changes right hip joint with subchondral cyst formation at right acetabular roof.   Original Report Authenticated By: Ulyses Southward, M.D.    Impression: She has a fracture of the acetabulum. She is having pretty severe pain. She has hypertension which is been difficult to control. She has nausea related to her  pain medication Active Problems:  * No active hospital problems. *      Plan: Orthopedic consultation. Continue with current medications and treatments      Mervyn Pflaum L Pager [phone]  07/19/2012, 7:57 AM

## 2012-07-19 NOTE — Progress Notes (Signed)
PT AND FAMILY COUNSELED THAT PT IS NOT TO TAKE MEDS FROM HOME. PT IS ONLY TO TAKE MEDS ORDERED BY DR Juanetta Gosling AND GIVEN TO HER BY HER NURSE. HUSBAND, SON AND DAUGHTER AT BEDSIDE AT LONG INTERVALS.  PT REQUEST ORDER OF VALIUM 2MG  PO BID. WHEN GIVEN 1ST DOSE PT BROKE TABLET IN HALF. REMAINDER R WASTED IN SHARPS BOX.

## 2012-07-20 MED ORDER — DIAZEPAM 2 MG PO TABS
2.0000 mg | ORAL_TABLET | Freq: Two times a day (BID) | ORAL | Status: DC
  Administered 2012-07-21 – 2012-07-22 (×3): 2 mg via ORAL
  Filled 2012-07-20 (×4): qty 1

## 2012-07-20 NOTE — Clinical Social Work Placement (Signed)
Clinical Social Work Department CLINICAL SOCIAL WORK PLACEMENT NOTE 07/20/2012  Patient:  TERRIL, AMARO  Account Number:  [id number] Admit date:  07/18/2012  Clinical Social Worker:  Derenda Fennel, LCSW  Date/time:  07/20/2012 01:20 PM  Clinical Social Work is seeking post-discharge placement for this patient at the following level of care:   SKILLED NURSING   (*CSW will update this form in Epic as items are completed)   07/20/2012  Patient/family provided with [facility] Department of Clinical Social Work's list of facilities offering this level of care within the geographic area requested by the patient (or if unable, by the patient's family).  07/20/2012  Patient/family informed of their freedom to choose among providers that offer the needed level of care, that participate in Medicare, Medicaid or managed care program needed by the patient, have an available bed and are willing to accept the patient.  07/20/2012  Patient/family informed of [facility]' ownership interest in [facility], as well as of the fact that they are under no obligation to receive care at this facility.  PASARR submitted to EDS on 07/20/2012 PASARR number received from EDS on 07/20/2012  FL2 transmitted to all facilities in geographic area requested by pt/family on  07/20/2012 FL2 transmitted to all facilities within larger geographic area on   Patient informed that his/her managed care company has contracts with or will negotiate with  certain facilities, including the following:     Patient/family informed of bed offers received:   Patient chooses bed at  Physician recommends and patient chooses bed at    Patient to be transferred to  on   Patient to be transferred to facility by   The following physician request were entered in Epic:   Additional Comments:  Derenda Fennel, LCSW [phone]

## 2012-07-20 NOTE — Evaluation (Addendum)
Occupational Therapy Evaluation Patient Details Name: Felicia Wells MRN: [id number] DOB: 1919-08-29 Today's Date: 07/20/2012 Time: 5621-3086 OT Time Calculation (min): 17 min  OT Assessment / Plan / Recommendation Clinical Impression  Patient is a 78 y/o female s/p Acetabulum fx presenting to acute OT with deficits below. PTA, patient was indepedent with assistive devices. Patient will benefit from OT services to increase ADL performance, functional transfers, and Bil UE strength and endurance.     OT Assessment  Patient needs continued OT Services    Follow Up Recommendations  SNF    Barriers to Discharge Decreased caregiver support Husband is unable to assist at home.  Equipment Recommendations  None recommended by OT       Frequency  Min 2X/week    Precautions / Restrictions Precautions Precautions: Fall   Pertinent Vitals/Pain No complaints.    ADL  Lower Body Dressing: Simulated;+1 Total assistance Where Assessed - Lower Body Dressing: Supine, head of bed up Transfers/Ambulation Related to ADLs: Patient declined to get out of bed at this time.    OT Diagnosis: Generalized weakness  OT Problem List: Decreased strength;Decreased activity tolerance;Impaired balance (sitting and/or standing);Decreased knowledge of use of DME or AE;Pain OT Treatment Interventions: Self-care/ADL training;DME and/or AE instruction;Therapeutic exercise;Manual therapy;Energy conservation;Therapeutic activities;Balance training;Patient/family education   OT Goals Acute Rehab OT Goals OT Goal Formulation: With patient Time For Goal Achievement: 08/03/12 Potential to Achieve Goals: Good ADL Goals Pt Will Perform Grooming: with modified independence;Sitting, edge of bed ADL Goal: Grooming - Progress: Goal set today Pt Will Perform Upper Body Bathing: with set-up;Sitting, edge of bed ADL Goal: Upper Body Bathing - Progress: Goal set today Pt Will Perform Lower Body Bathing: with mod  assist;Sitting, edge of bed ADL Goal: Lower Body Bathing - Progress: Goal set today Pt Will Perform Upper Body Dressing: with set-up;Sitting, bed ADL Goal: Upper Body Dressing - Progress: Goal set today Pt Will Perform Lower Body Dressing: with mod assist;with adaptive equipment;Sitting, bed ADL Goal: Lower Body Dressing - Progress: Goal set today Pt Will Transfer to Toilet: with min assist;Maintaining weight bearing status;3-in-1;with DME;Stand pivot transfer ADL Goal: Toilet Transfer - Progress: Goal set today Pt Will Perform Toileting - Clothing Manipulation: with mod assist;Sitting on 3-in-1 or toilet ADL Goal: Toileting - Clothing Manipulation - Progress: Goal set today Pt Will Perform Toileting - Hygiene: with mod assist;Leaning right and/or left on 3-in-1/toilet;Sit to stand from 3-in-1/toilet ADL Goal: Toileting - Hygiene - Progress: Goal set today Arm Goals Pt Will Complete Theraband Exer: with supervision, verbal cues required/provided;to increase strength;Bilateral upper extremities;1 set;10 reps;Level 1 Theraband Arm Goal: Theraband Exercises - Progress: Goal set today  Visit Information  Last OT Received On: 07/20/12 Assistance Needed: +1    Subjective Data  Subjective: "I'm feeling very sleepy today." Patient Stated Goal: To go home.   Prior Functioning     Home Living Lives With: Spouse Available Help at Discharge: Family;Available PRN/intermittently Type of Home: House Home Access: Stairs to enter Entrance Stairs-Rails: Right Home Layout: One level Bathroom Shower/Tub: Health visitor: Handicapped height Home Adaptive Equipment: Bedside commode/3-in-1;Built-in shower seat;Straight cane;Grab bars in shower Prior Function Level of Independence: Independent with assistive device(s) Able to Take Stairs?: Yes Driving: Yes Vocation: Retired Musician: No difficulties Dominant Hand: Right            Cognition  Overall  Cognitive Status: Appears within functional limits for tasks assessed/performed Arousal/Alertness: Awake/alert Orientation Level: Appears intact for tasks assessed Behavior During Session: [facility] for  tasks performed    Extremity/Trunk Assessment Right Upper Extremity Assessment RUE ROM/Strength/Tone: Deficits RUE ROM/Strength/Tone Deficits: A/ROM WFL in all ranges. MMT: shoulder flexion/extension, elbow flexion, horizontal abduction/adduction: 4/5; elbow extension: 3+/5 RUE Sensation: WFL - Light Touch;WFL - Proprioception RUE Coordination: WFL - gross/fine motor Left Upper Extremity Assessment LUE ROM/Strength/Tone: Within functional levels (A/ROM WFL in all ranges. MMT: 4/5) LUE Sensation: WFL - Light Touch;WFL - Proprioception LUE Coordination: WFL - gross/fine motor                 End of Session OT - End of Session Activity Tolerance: Patient limited by fatigue Patient left: in bed;with call bell/phone within reach;with bed alarm set;with family/visitor present    Felicia Wells, OTR/L 07/20/2012, 2:50 PM

## 2012-07-20 NOTE — Progress Notes (Signed)
Patient ID: Felicia Wells, female   DOB: 08/13/1919, 78 y.o.   MRN: [id number] HIP FRACTURE INSTRUCTIONS: WEIGHT BEARING AS TOLERATED WITH A WALKER X 6 WEEKS

## 2012-07-20 NOTE — Clinical Social Work Psychosocial (Signed)
Clinical Social Work Department BRIEF PSYCHOSOCIAL ASSESSMENT 07/20/2012  Patient:  Felicia Wells, Felicia Wells     Account Number:  [id number]     Admit date:  07/18/2012  Clinical Social Worker:  Nancie Neas  Date/Time:  07/20/2012 01:25 PM  Referred by:  Physician  Date Referred:  07/20/2012 Referred for  SNF Placement   Other Referral:   Interview type:  Patient Other interview type:   and husband    PSYCHOSOCIAL DATA Living Status:  HUSBAND Admitted from facility:   Level of care:   Primary support name:  Molly Maduro Primary support relationship to patient:  SPOUSE Degree of support available:   supportive    CURRENT CONCERNS Current Concerns  Post-Acute Placement   Other Concerns:    SOCIAL WORK ASSESSMENT / PLAN CSW met with pt and pt's husband at bedside. Pt alert and oriented and reports she lives at home with her husband. She generally manages well and uses a cane if needed. Pt reports she tripped over the leg of a table and fell. She states her family is supportive, although only one child lives in town. PT worked with pt and are recommending SNF. They are aware of Medicare criteria/coverage. SNF list provided. Pt requests [place] placement if possible.   Assessment/plan status:  Psychosocial Support/Ongoing Assessment of Needs Other assessment/ plan:   Information/referral to community resources:   SNF list    PATIENT'S/FAMILY'S RESPONSE TO PLAN OF CARE: Pt reports positive feelings regarding [facility] for rehab prior to returning home. CSW faxed out FL2 and will follow up with bed offers when available.        Derenda Fennel, [place] [phone]

## 2012-07-20 NOTE — Evaluation (Deleted)
Physical Therapy Evaluation Patient Details Name: Felicia Wells MRN: [id number] DOB: 1919/03/08 Today's Date: 07/20/2012 Time: 1191-4782 PT Time Calculation (min): 62 min  PT Assessment / Plan / Recommendation Clinical Impression  This is a delightful pt who has been functioning at a very high level for her age, now with acetabular fx. RLE.  She is able to follow all directions and required mod to max assist to transfer OOB, only able to ambulate 3' with walker due to pain in R hip.  She will need SNF at d/c.    PT Assessment  Patient needs continued PT services    Follow Up Recommendations  SNF    Does the patient have the potential to tolerate intense rehabilitation      Barriers to Discharge Decreased caregiver support Husband is 95 yrs.    Equipment Recommendations  Rolling walker with 5" wheels    Recommendations for Other Services OT consult   Frequency Min 5X/week    Precautions / Restrictions Precautions Precautions: Fall Restrictions Weight Bearing Restrictions: No   Pertinent Vitals/Pain       Mobility  Bed Mobility Bed Mobility: Supine to Sit;Sit to Supine Supine to Sit: 2: Max assist;HOB elevated Sit to Supine: Not Tested (comment) Details for Bed Mobility Assistance: Pt has Meunierres Disease and needs to change position very slowly to avoid dizziness Transfers Transfers: Sit to Stand;Stand to Sit Sit to Stand: 3: Mod assist;From bed;With upper extremity assist Stand to Sit: 3: Mod assist;With upper extremity assist;To chair/3-in-1 Ambulation/Gait Ambulation/Gait Assistance: 3: Mod assist Ambulation Distance (Feet): 3 Feet Assistive device: Rolling walker Ambulation/Gait Assistance Details: very painful weight bearing on RLE (8/10) Gait Pattern: Antalgic General Gait Details: unable to analze gait in such a short span Stairs: No Wheelchair Mobility Wheelchair Mobility: No    Shoulder Instructions     Exercises General Exercises - Lower  Extremity Ankle Circles/Pumps: Both;10 reps;Supine Quad Sets: AROM;Both;10 reps Gluteal Sets: AROM;Both;10 reps;Supine Short Arc Quad: AAROM;Right;10 reps;Supine Heel Slides: AAROM;Right;10 reps;Supine Hip ABduction/ADduction: AAROM;Right;10 reps;Supine   PT Diagnosis: Difficulty walking;Acute pain  PT Problem List: Decreased strength;Decreased range of motion;Decreased activity tolerance;Decreased mobility;Decreased knowledge of use of DME;Decreased knowledge of precautions;Pain PT Treatment Interventions: DME instruction;Gait training;Functional mobility training;Therapeutic exercise;Patient/family education   PT Goals Acute Rehab PT Goals PT Goal Formulation: With patient Time For Goal Achievement: 07/20/12 Potential to Achieve Goals: Good Pt will go Supine/Side to Sit: with mod assist;with HOB not 0 degrees (comment degree) PT Goal: Supine/Side to Sit - Progress: Goal set today Pt will go Sit to Supine/Side: with mod assist;with HOB not 0 degrees (comment degree) PT Goal: Sit to Supine/Side - Progress: Goal set today Pt will go Sit to Stand: with min assist;with upper extremity assist PT Goal: Sit to Stand - Progress: Goal set today Pt will go Stand to Sit: with min assist;with upper extremity assist PT Goal: Stand to Sit - Progress: Goal set today Pt will Ambulate: 1 - 15 feet;with min assist;with rolling walker PT Goal: Ambulate - Progress: Goal set today  Visit Information  Last PT Received On: 07/20/12    Subjective Data  Subjective: I tripped over a table leg Patient Stated Goal: return home   Prior Functioning  Home Living Lives With: Spouse Available Help at Discharge: Family;Available PRN/intermittently (husband is 1 years old-unable to assist) Type of Home: House Home Access: Stairs to enter Entrance Stairs-Rails: Right Home Layout: One level Bathroom Shower/Tub: Health visitor: Handicapped height Home Adaptive Equipment: Bedside  commode/3-in-1;Grab  bars in shower;Straight cane Additional Comments: built in shower seat Prior Function Level of Independence: Independent with assistive device(s) (cane) Able to Take Stairs?: Yes Driving: Yes Vocation: Retired Musician: No difficulties    Cognition  Overall Cognitive Status: Appears within functional limits for tasks assessed/performed Arousal/Alertness: Awake/alert Orientation Level: Appears intact for tasks assessed Behavior During Session: [facility] for tasks performed    Extremity/Trunk Assessment Right Lower Extremity Assessment RLE ROM/Strength/Tone: Deficits;Unable to fully assess;Due to pain RLE ROM/Strength/Tone Deficits: all musculature intact but painful with movement RLE Sensation: WFL - Light Touch Left Lower Extremity Assessment LLE ROM/Strength/Tone: Within functional levels LLE Sensation: WFL - Light Touch LLE Coordination: WFL - gross motor Trunk Assessment Trunk Assessment: Normal   Balance Balance Balance Assessed: No  End of Session PT - End of Session Equipment Utilized During Treatment: Gait belt Activity Tolerance: Patient tolerated treatment well;Patient limited by pain Patient left: in chair;with call bell/phone within reach;with chair alarm set;with family/visitor present Nurse Communication: Mobility status  GP     Ayomide Zuleta ATKINSO 07/20/2012, 9:50 AM

## 2012-07-20 NOTE — Progress Notes (Addendum)
Physical Therapy Evaluation Patient Details  Name: Felicia Wells  MRN: [id number]  DOB: 1918-11-22  Today's Date: 07/20/2012  Time: 4098-1191  PT Time Calculation (min): 62 min  PT Assessment / Plan / Recommendation  Clinical Impression  This is a delightful pt who has been functioning at a very high level for her age, now with acetabular fx. RLE. She is able to follow all directions and required mod to max assist to transfer OOB, only able to ambulate 3' with walker due to pain in R hip. She will need SNF at d/c.   PT Assessment  Patient needs continued PT services   Follow Up Recommendations  SNF   Does the patient have the potential to tolerate intense rehabilitation    Barriers to Discharge  Decreased caregiver support  Husband is 95 yrs.   Equipment Recommendations  Rolling walker with 5" wheels   Recommendations for Other Services  OT consult   Frequency  Min 5X/week   Precautions / Restrictions  Precautions  Precautions: Fall  Restrictions  Weight Bearing Restrictions: No   Pertinent Vitals/Pain     Mobility  Bed Mobility  Bed Mobility: Supine to Sit;Sit to Supine  Supine to Sit: 2: Max assist;HOB elevated  Sit to Supine: Not Tested (comment)  Details for Bed Mobility Assistance: Pt has Meunierres Disease and needs to change position very slowly to avoid dizziness  Transfers  Transfers: Sit to Stand;Stand to Sit  Sit to Stand: 3: Mod assist;From bed;With upper extremity assist  Stand to Sit: 3: Mod assist;With upper extremity assist;To chair/3-in-1  Ambulation/Gait  Ambulation/Gait Assistance: 3: Mod assist  Ambulation Distance (Feet): 3 Feet  Assistive device: Rolling walker  Ambulation/Gait Assistance Details: very painful weight bearing on RLE (8/10)  Gait Pattern: Antalgic  General Gait Details: unable to analze gait in such a short span  Stairs: No  Wheelchair Mobility  Wheelchair Mobility: No   Shoulder Instructions    Exercises  General Exercises - Lower  Extremity  Ankle Circles/Pumps: Both;10 reps;Supine  Quad Sets: AROM;Both;10 reps  Gluteal Sets: AROM;Both;10 reps;Supine  Short Arc Quad: AAROM;Right;10 reps;Supine  Heel Slides: AAROM;Right;10 reps;Supine  Hip ABduction/ADduction: AAROM;Right;10 reps;Supine   PT Diagnosis: Difficulty walking;Acute pain  PT Problem List: Decreased strength;Decreased range of motion;Decreased activity tolerance;Decreased mobility;Decreased knowledge of use of DME;Decreased knowledge of precautions;Pain  PT Treatment Interventions: DME instruction;Gait training;Functional mobility training;Therapeutic exercise;Patient/family education  PT Goals  Acute Rehab PT Goals  PT Goal Formulation: With patient  Time For Goal Achievement: 07/20/12  Potential to Achieve Goals: Good  Pt will go Supine/Side to Sit: with mod assist;with HOB not 0 degrees (comment degree)  PT Goal: Supine/Side to Sit - Progress: Goal set today  Pt will go Sit to Supine/Side: with mod assist;with HOB not 0 degrees (comment degree)  PT Goal: Sit to Supine/Side - Progress: Goal set today  Pt will go Sit to Stand: with min assist;with upper extremity assist  PT Goal: Sit to Stand - Progress: Goal set today  Pt will go Stand to Sit: with min assist;with upper extremity assist  PT Goal: Stand to Sit - Progress: Goal set today  Pt will Ambulate: 1 - 15 feet;with min assist;with rolling walker  PT Goal: Ambulate - Progress: Goal set today  Visit Information  Last PT Received On: 07/20/12   Subjective Data  Subjective: I tripped over a table leg  Patient Stated Goal: return home   Prior Functioning  Home Living  Lives With: Spouse  Available  Help at Discharge: Family;Available PRN/intermittently (husband is 59 years old-unable to assist)  Type of Home: House  Home Access: Stairs to enter  Entrance Stairs-Rails: Right  Home Layout: One level  Bathroom Shower/Tub: Pension scheme manager: Handicapped height  Home Adaptive Equipment:  Bedside commode/3-in-1;Grab bars in shower;Straight cane  Additional Comments: built in shower seat  Prior Function  Level of Independence: Independent with assistive device(s) (cane)  Able to Take Stairs?: Yes  Driving: Yes  Vocation: Retired  Geneticist, molecular: No difficulties   Cognition  Overall Cognitive Status: Appears within functional limits for tasks assessed/performed  Arousal/Alertness: Awake/alert  Orientation Level: Appears intact for tasks assessed  Behavior During Session: [facility] for tasks performed   Extremity/Trunk Assessment  Right Lower Extremity Assessment  RLE ROM/Strength/Tone: Deficits;Unable to fully assess;Due to pain  RLE ROM/Strength/Tone Deficits: all musculature intact but painful with movement  RLE Sensation: WFL - Light Touch  Left Lower Extremity Assessment  LLE ROM/Strength/Tone: Within functional levels  LLE Sensation: WFL - Light Touch  LLE Coordination: WFL - gross motor  Trunk Assessment  Trunk Assessment: Normal   Balance  Balance  Balance Assessed: No   End of Session  PT - End of Session  Equipment Utilized During Treatment: Gait belt  Activity Tolerance: Patient tolerated treatment well;Patient limited by pain  Patient left: in chair;with call bell/phone within reach;with chair alarm set;with family/visitor present  Nurse Communication: Mobility status   GP

## 2012-07-21 ENCOUNTER — Inpatient Hospital Stay (HOSPITAL_COMMUNITY): Payer: Medicare Other

## 2012-07-21 MED ORDER — LEVALBUTEROL HCL 0.63 MG/3ML IN NEBU: 0.6300 mg | INHALATION_SOLUTION | Freq: Four times a day (QID) | RESPIRATORY_TRACT | Status: DC | PRN

## 2012-07-21 MED ORDER — DEXTROSE 5 % IV SOLN
1.0000 g | INTRAVENOUS | Status: DC
  Administered 2012-07-21: 1 g via INTRAVENOUS
  Filled 2012-07-21 (×2): qty 10

## 2012-07-21 MED ORDER — GUAIFENESIN-DM 100-10 MG/5ML PO SYRP
5.0000 mL | ORAL_SOLUTION | ORAL | Status: DC | PRN
  Administered 2012-07-21: 5 mL via ORAL
  Filled 2012-07-21: qty 5

## 2012-07-21 MED ORDER — HYDRALAZINE HCL 10 MG PO TABS
10.0000 mg | ORAL_TABLET | Freq: Two times a day (BID) | ORAL | Status: DC
  Administered 2012-07-21 – 2012-07-22 (×3): 10 mg via ORAL
  Filled 2012-07-21 (×3): qty 1

## 2012-07-21 MED ORDER — HYDROCHLOROTHIAZIDE 25 MG PO TABS
25.0000 mg | ORAL_TABLET | Freq: Every day | ORAL | Status: DC
  Administered 2012-07-21 – 2012-07-22 (×2): 25 mg via ORAL
  Filled 2012-07-21 (×2): qty 1

## 2012-07-21 NOTE — Clinical Social Work Placement (Signed)
Clinical Social Work Department CLINICAL SOCIAL WORK PLACEMENT NOTE 07/21/2012  Patient:  Felicia Wells, Felicia Wells  Account Number:  [id number] Admit date:  07/18/2012  Clinical Social Worker:  Derenda Fennel, LCSW  Date/time:  07/20/2012 01:20 PM  Clinical Social Work is seeking post-discharge placement for this patient at the following level of care:   SKILLED NURSING   (*CSW will update this form in Epic as items are completed)   07/20/2012  Patient/family provided with [facility] Department of Clinical Social Work's list of facilities offering this level of care within the geographic area requested by the patient (or if unable, by the patient's family).  07/20/2012  Patient/family informed of their freedom to choose among providers that offer the needed level of care, that participate in Medicare, Medicaid or managed care program needed by the patient, have an available bed and are willing to accept the patient.  07/20/2012  Patient/family informed of [facility]' ownership interest in [facility], as well as of the fact that they are under no obligation to receive care at this facility.  PASARR submitted to EDS on 07/20/2012 PASARR number received from EDS on 07/20/2012  FL2 transmitted to all facilities in geographic area requested by pt/family on  07/20/2012 FL2 transmitted to all facilities within larger geographic area on   Patient informed that his/her managed care company has contracts with or will negotiate with  certain facilities, including the following:     Patient/family informed of bed offers received:  07/21/2012 Patient chooses bed at [facility] Physician recommends and patient chooses bed at  [facility]  Patient to be transferred to [facility] on  07/21/2012 Patient to be transferred to facility by RN  The following physician request were entered in Epic:   Additional Comments:  Derenda Fennel, LCSW [phone]

## 2012-07-21 NOTE — Clinical Social Work Note (Signed)
D/C canceled due to elevated blood pressure per MD. Awaiting stability for d/c. PNC updated.  Derenda Fennel, [place] [phone]

## 2012-07-21 NOTE — Progress Notes (Signed)
She has done pretty well. She still has pain. She need skilled care facility for rehabilitation from her fracture. She says she's coughing and wheezing some this morning. Her blood pressure has been coming up after admission and I have restarted some of her other blood pressure medications.  Exam shows her chest is actually clear when I listen. Her heart is regular. Her blood pressure now in the 140s over 90s. She is awake alert and oriented  She has an acetabular fracture. She has a cough and may have bronchitis. She has hypertension and her blood pressure has come up  She has a bed available at a skilled care facility today and I'm going to plan to transfer her

## 2012-07-21 NOTE — Discharge Summary (Addendum)
Final discharge diagnoses:  Acetabular fracture Osteoporosis Hypertension Mnire's disease Acute bronchitis Anxiety  History of present illness and hospital course: She fell at home. She did not lose consciousness she caught her foot on a table leg. She had severe pain in her hip and was brought to the emergency room where she was found to have a fracture of the acetabulum. She was unable to bear weight. She was admitted to the hospital. She initially was somewhat hypotensive but her blood pressure has been steadily coming up since her admission. She had PT consultation and orthopedic consultation and it is felt that she needs skilled care facility placement for rehabilitation. She developed a cough and has been diagnosed as acute bronchitis.  She will be on a no added salt regular diet. She will have physical therapy occupational therapy and speech therapy as needed  She will be on the following medications: Tylenol 650 mg by mouth every 4 hours when necessary pain or fever Robitussin-DM 5 cc every 4 hours when necessary cough Keflex 500 mg 3 times a day x10 days Zofran 4 mg by mouth every 4 hours when necessary nausea Valium 2 mg by mouth twice a day when necessary and anxiety Lovenox 30 mg subcutaneously every 24 hours Hydralazine 10 mg twice a day HCTZ 25 mg daily Avapro 300 mg daily Bystolic 10 mg twice a day Timoptic 0.5% solution 2 drops left eye twice a day Norco 5/325 every 4 hours when necessary moderate pain   She had more trouble with acute  Bronchitis so her discharge was canceled. She also had more trouble with her blood pressure. She was started on IV Rocephin. She improved and will continue IV Rocephin for 5 days at the skilled care facility. She will also have Xopenex 0.63  Three  times a day as needed for shortness of breath.she will not take Keflex

## 2012-07-21 NOTE — Progress Notes (Signed)
When I came back to see her she was having significantly more trouble with cough and now has some audible wheezes. Her chest x-ray was okay but because of her increasing symptoms and  her general frail health I do not feel comfortable with discharging her today.i don't think she has aspirated but certainly after the fall that is a potential problem. I will start her on antibiotics. I am also concerned about her very volatile blood pressure and the dificulty I have had controlling her blood pressure so I will want to monitor that for another 24 hours or so since I have changed her blood pressure medication. I have canceled her discharge

## 2012-07-21 NOTE — Clinical Social Work Note (Signed)
CSW presented bed offers and pt chooses [facility]. Facility notified. Pt d/c today. D/C summary will be faxed and pt to transfer with RN.   Derenda Fennel, [place] [phone]

## 2012-07-22 ENCOUNTER — Inpatient Hospital Stay
Admission: RE | Admit: 2012-07-22 | Discharge: 2012-08-24 | Disposition: A | Discharge status: Home / Self Care | Payer: Medicare Other | Source: Ambulatory Visit | Attending: Pulmonary Disease | Admitting: Pulmonary Disease

## 2012-07-22 MED ORDER — DEXTROSE 5 % IV SOLN: 1.0000 g | INTRAVENOUS | Status: DC

## 2012-07-22 NOTE — Clinical Social Work Note (Signed)
Pt d/c today to [facility]. Facility aware of Rocephin through peripheral IV for 5 days. D/C summary faxed. Pt to transfer with RN.    Derenda Fennel, [place] [phone]

## 2012-07-22 NOTE — Progress Notes (Signed)
Pt transported via wheelchair to [facility]. Report given. Currently voices no c/o pain or discomfort.

## 2012-07-22 NOTE — Progress Notes (Signed)
I canceled her discharge yesterday because of the problem with cough congestion and her blood pressure. Her blood pressure is still labile. She had sweating last night but no overt fever. Her chest x-ray did not show an infiltrate but her exam today shows that she has rales in the right base. I think if she is able to be discharged she's going to need to be on IV antibiotics probably for another 5 days. She's back on her regular blood pressure medication so that should help regulate her problem. She may be able to go to day.

## 2012-07-26 NOTE — H&P (Signed)
NAMEFREYA, ZOBRIST              ACCOUNT NO.:  [id number]  MEDICAL RECORD NO.:  [id number]  LOCATION:                                 FACILITY:  PHYSICIAN:  Wendelin Bradt L. Juanetta Gosling, M.D.DATE OF BIRTH:  [birth year]  DATE OF ADMISSION:  07/22/2012 DATE OF DISCHARGE:  LH                             HISTORY & PHYSICAL   Patient at the [facility].  Ms. Felicia Wells is a 78 year old, who fell about a week ago and who suffered an acetabular fracture.  She was admitted to the hospital, and it was clear that she was going to require rehabilitation.  She also had cough and congestion, and required IV antibiotics.  She has had difficulty controlling her blood pressure for sometime.  PAST MEDICAL HISTORY:  Positive for Meniere's disease, hypertension, overactive bladder, anxiety, the acetabular fracture.  SOCIAL HISTORY:  She lives at home with her husband.  She does not smoke.  She does not use alcohol.  She does not use illicit drugs.  FAMILY HISTORY:  Noncontributory.  REVIEW OF SYSTEMS:  Except as mentioned is negative.  PHYSICAL EXAMINATION:  GENERAL:  She is awake and alert, looks comfortable, sitting in a wheelchair. VITAL SIGNS:  As recorded. HEENT:  Her nose and throat are clear.  Mucous membranes are dry. NECK:  Supple. CHEST:  Clear. HEART:  Regular. ABDOMEN:  Soft. EXTREMITIES:  No edema. CENTRAL NERVOUS SYSTEM:  Grossly intact.  ASSESSMENT:  She is better.  PLAN:  No change in treatments.  Continue with current meds and continue rehab.     Kiri Hinderliter L. Juanetta Gosling, M.D.     ELH/MEDQ  D:  07/25/2012  T:  07/25/2012  Job:  [id number]

## 2012-08-16 NOTE — Progress Notes (Signed)
She is awake and alert. She is in a wheelchair. She's doing much better with physical therapy. She has no new complaints.  She is sitting in a wheelchair. She is awake and alert. Her chest is clear. Her heart is regular. Her abdomen is soft.  She has a acetabular fracture and is improving. She may be able to go home within the next week or so. She has hypertension which is pretty well controlled.  Continue current medications.

## 2012-09-03 ENCOUNTER — Ambulatory Visit (HOSPITAL_COMMUNITY)
Admission: RE | Admit: 2012-09-03 | Discharge: 2012-09-03 | Disposition: A | Discharge status: Home / Self Care | Payer: Medicare PPO | Source: Ambulatory Visit | Attending: Pulmonary Disease | Admitting: Pulmonary Disease

## 2012-09-03 DIAGNOSIS — R1310 Dysphagia, unspecified: Secondary | ICD-10-CM | POA: Insufficient documentation

## 2012-09-03 DIAGNOSIS — I10 Essential (primary) hypertension: Secondary | ICD-10-CM | POA: Insufficient documentation

## 2012-09-03 DIAGNOSIS — IMO0001 Reserved for inherently not codable concepts without codable children: Secondary | ICD-10-CM | POA: Insufficient documentation

## 2012-09-03 NOTE — Evaluation (Signed)
Physical Therapy Evaluation  Patient Details  Name: Felicia Wells MRN: [id number] Date of Birth: 01-25-1919  Today's Date: 09/03/2012 Time: 935  - 1015.  Time: 40 minutes  Charges: 1 eval  Visit#:  1 of   10 Re-eval:   Assessment Diagnosis: R femoral acetabular fracture Surgical Date: 07/18/12 Next MD Visit: Dr. Juanetta Gosling - 09/06/12 Prior Therapy: SNF until 08/24/12  Authorization:   MEDICARE Authorization Time Period:    Authorization Visit#:   1 of   10  Past Medical History:  Past Medical History  Diagnosis Date  . Infections of kidney   . Hypertension   . Osteoporosis   . Eye problem   . Anxiety    Past Surgical History:  Past Surgical History  Procedure Date  . Eye surgery   . Tonsillectomy   . Thyroid surgery   . Dilation and curettage of uterus   . Brain shunt   . Hernia repair   . Cataract extraction   . Foot surgery    Subjective Symptoms/Limitations Pertinent History: Pt is referred to PT s/p R acetabular fracture after a fall on 07/18/12 from falling at her home over a stool.  Her c/co is pain when she is sitting and when she is laying in bed.  PLOF: independent with all household activities.  Pain Assessment Currently in Pain?: Yes Pain Score:   3 Pain Location: Hip Pain Orientation: Right Pain Type: Acute pain  Prior Functioning  Home Living Lives With: Spouse Type of Home: House Prior Function Able to Take Stairs?: Reciprically Driving: Yes  Sensation/Coordination/Flexibility/Functional Tests Functional Tests Functional Tests: ABC:  Functional Tests: 30 sec STS: 0x complete   RLE Strength RLE Overall Strength Comments: taken in seated and standing position Right Hip Flexion: 3+/5 Right Hip Extension: 3+/5 Right Hip ABduction: 3+/5 Right Knee Flexion: 4/5 Right Knee Extension: 4/5 LLE Strength LLE Overall Strength Comments: taken in seated and standing position Left Hip Flexion: 4/5 Left Hip Extension: 3+/5 Left Hip ABduction:  3+/5 Left Knee Flexion: 4/5 Left Knee Extension: 4/5 Palpation Palpation: moderate pain and tenderness to R low back and gluteal region (R>L)  Mobility/Balance  Ambulation/Gait Ambulation/Gait: Yes Ambulation/Gait Assistance: 6: Modified independent (Device/Increase time) Assistive device: Rolling walker Programmer, systems Test Sit to Stand: Able to stand  independently using hands Standing Unsupported: Able to stand safely 2 minutes Sitting with Back Unsupported but Feet Supported on Floor or Stool: Able to sit safely and securely 2 minutes Stand to Sit: Uses backs of legs against chair to control descent Transfers: Able to transfer with verbal cueing and /or supervision Standing Unsupported with Eyes Closed: Able to stand 10 seconds with supervision Standing Ubsupported with Feet Together: Able to place feet together independently and stand for 1 minute with supervision From Standing, Reach Forward with Outstretched Arm: Can reach forward >12 cm safely (5") From Standing Position, Pick up Object from Floor: Unable to pick up shoe, but reaches 2-5 cm (1-2") from shoe and balances independently From Standing Position, Turn to Look Behind Over each Shoulder: Turn sideways only but maintains balance Turn 360 Degrees: Able to turn 360 degrees safely but slowly Standing Unsupported, Alternately Place Feet on Step/Stool: Needs assistance to keep from falling or unable to try Standing Unsupported, One Foot in Front: Needs help to step but can hold 15 seconds Standing on One Leg: Unable to try or needs assist to prevent fall Total Score: 31    Exercise/Treatments Standing  Hip Abduction: 10 Reps Hip Extension: 10  Reps Squats: 10 reps  Physical Therapy Assessment and Plan PT Assessment and Plan  Pt will benefit from skilled therapeutic intervention in order to improve on the following deficits: Decreased balance;Abnormal gait;Decreased activity tolerance;Pain;Difficulty walking;Decreased  strength;Impaired perceived functional ability  Rehab Potential: Good  PT Frequency: Min 3X/week  PT Duration: 8 weeks  PT Treatment/Interventions: DME instruction;Gait training;Stair training;Functional mobility training;Therapeutic activities;Therapeutic exercise;Neuromuscular re-education;Balance training;Patient/family education;Manual techniques;Modalities  PT Plan: Focus on improving pt confidence with independent gait, improving Berg Score, improve LE strength. Progress to TM walking to improve stride length and activity tolerance.   Goals  Home Exercise Program  Pt will Perform Home Exercise Program: Independently  PT Goal: Perform Home Exercise Program - Progress: Goal set today  PT Short Term Goals  Time to Complete Short Term Goals: 2 weeks  PT Short Term Goal 1: Pt will report hip pain less than 3/10 for 75% of her day.  PT Short Term Goal 2: Pt will improve R and L SLS on static surface to 10 sec.  PT Short Term Goal 3: Pt will begin to ambulate w/o AD and supervision level in indoor environment.  PT Short Term Goal 4: Pt will improve LE strength by 1 muscle grade.  PT Long Term Goals  Time to Complete Long Term Goals: 4 weeks  PT Long Term Goal 1: Pt will improve LE strength in order to ascend and desced 5 stairs w/1 handrail w/reciprocal pattern in order to enter community dwellings.  PT Long Term Goal 2: Pt will improve LE functional strength and mobility and perform 4 STS in 30 sec. w/o HHA.  Long Term Goal 3: Pt will improve her gait speed and complete at least 429 feet a 2 minute walk test w/o AD at supervision level to decrease fall risk.  Long Term Goal 4: Pt will improve Berg Score to 46/56 to decrease falls risk.   Problem List Patient Active Problem List  Diagnosis  . Muscle weakness (generalized)  . Difficulty in walking  . Fracture of acetabulum  . Hypertension  . Meniere's disease   PT Plan of Care  PT Home Exercise Plan: see scanned report  Consulted and  Agree with Plan of Care: Patient;Family member/caregiver  Family Member Consulted: husband  GP Functional Assessment Tool Used: Clinical observation  [facility] PT THERAPY MOBILITY GOAL STATUS [id number] 09/03/2012 Matilde Haymaker, PT GP, CI 1 Filed [facility] PT THERAPY MOBILITY CURRENT STATUS [id number] 09/03/2012 Matilde Haymaker, PT GP, CK 1 Filed  Lira Stephen 09/03/2012, 10:17 AM  Physician Documentation Your signature is required to indicate approval of the treatment plan as stated above.  Please sign and either send electronically or make a copy of this report for your files and return this physician signed original.   Please mark one 1.__approve of plan  2. ___approve of plan with the following conditions.   ______________________________                                                          _____________________ Physician Signature  Date  

## 2012-09-06 ENCOUNTER — Ambulatory Visit (HOSPITAL_COMMUNITY)
Admission: RE | Admit: 2012-09-06 | Discharge: 2012-09-06 | Disposition: A | Discharge status: Home / Self Care | Payer: Medicare PPO | Source: Ambulatory Visit | Attending: Pulmonary Disease | Admitting: Pulmonary Disease

## 2012-09-06 NOTE — Progress Notes (Signed)
Physical Therapy Treatment Patient Details  Name: Felicia Wells MRN: [id number] Date of Birth: [birth year]  Today's Date: 09/06/2012 Time: 3086-5784 PT Time Calculation (min): 55 min Visit#: 2  of 8   Re-eval: 10/03/12 Authorization: MEDICARE  Authorization Visit#: 2  of 10   Charges:  therex 45'  Subjective: Symptoms/Limitations Symptoms: R hip frature s/p fall in home.  Pt. has decreased confidence in her abiities.  Reports no pain other than central LB pain with walking. Pain Assessment Currently in Pain?: No/denies   Exercise/Treatments Standing Heel Raises: 15 reps;Limitations Heel Raises Limitations: toeraises 15 reps Gait Training: without AD 1RT Other Standing Knee Exercises: hip abd, hip extension 10 reps bilaterally Other Standing Knee Exercises: retro, side stepping 1RT each Supine Bridges: 10 reps Straight Leg Raises: 10 reps;Right Sidelying Hip ABduction: 10 reps;Right Prone  Hip Extension: 10 reps;Right    Physical Therapy Assessment and Plan PT Assessment and Plan Clinical Impression Statement: Began new activities/exercises per POC without diffiuculty or LOB.  multimodal cues needed for form and posture.  Pt. with decreased confidence in self to perform ambulation without AD.  Began strengthening activties on mat for R LE and hip musculature.  Pt without c/o pain during or at end of session. PT Plan: Continue per PT POC.     Problem List Patient Active Problem List  Diagnosis  . Muscle weakness (generalized)  . Difficulty in walking  . Fracture of acetabulum  . Hypertension  . Meniere's disease    PT - End of Session Equipment Utilized During Treatment: Gait belt Activity Tolerance: Patient tolerated treatment well General Behavior During Session: [facility] for tasks performed Cognition: [facility] for tasks performed   Lurena Nida, PTA/CLT 09/06/2012, 4:47 PM

## 2012-09-08 ENCOUNTER — Ambulatory Visit (HOSPITAL_COMMUNITY)
Admission: RE | Admit: 2012-09-08 | Discharge: 2012-09-08 | Disposition: A | Discharge status: Home / Self Care | Payer: Medicare PPO | Source: Ambulatory Visit | Attending: Pulmonary Disease | Admitting: Pulmonary Disease

## 2012-09-08 NOTE — Progress Notes (Signed)
Physical Therapy Treatment Patient Details  Name: Felicia Wells MRN: [id number] Date of Birth: 30-Nov-1918  Today's Date: 09/08/2012 Time: 1520-1610 PT Time Calculation (min): 50 min  Visit#: 3  of 8   Re-eval: 10/03/12  Charge: Gait 12', therex 38'  Authorization: Medicare  Authorization Time Period:    Authorization Visit#: 3  of 10    Subjective: Symptoms/Limitations Symptoms: Pt stated she was sore following last session, reports LB pain when walking. Pain Assessment Currently in Pain?: Yes Pain Score:   4 Pain Location: Hip Pain Orientation: Right  Objective:   Exercise/Treatments Standing Heel Raises: 15 reps;Limitations Heel Raises Limitations: toeraises 15 reps Functional Squat: 10 reps Gait Training: without AD 2RT Other Standing Knee Exercises: hip abd, hip extension 10 reps bilaterally Other Standing Knee Exercises: retro, side stepping 1RT each Seated Other Seated Knee Exercises: 5 STS with no HHA and no assistance required when sitting on airex Supine Bridges: 10 reps Straight Leg Raises: 10 reps;Right Sidelying Hip ABduction: 10 reps;Right Prone  Hip Extension: 10 reps;Right      Physical Therapy Assessment and Plan PT Assessment and Plan Clinical Impression Statement: Pt able to demonstrate appropraite gait mechanics with no AD following vcing for heel to toe gait and to increase stride length without difficulty or LOB.  Began STS with no HHA and squats for functional strengthening with no c/o pain. PT Plan: Continue with current POC to improve confidence with independent gait, improve LE strength and balacne/activity tolerance.  Begin gait training on TM next session to improve stride length, activity tolerance and confidence    Goals    Problem List Patient Active Problem List  Diagnosis  . Muscle weakness (generalized)  . Difficulty in walking  . Fracture of acetabulum  . Hypertension  . Meniere's disease    PT - End of  Session Equipment Utilized During Treatment: Gait belt Activity Tolerance: Patient tolerated treatment well General Behavior During Session: [facility] for tasks performed Cognition: [facility] for tasks performed  GP    Juel Burrow 09/08/2012, 4:29 PM

## 2012-09-10 ENCOUNTER — Ambulatory Visit (HOSPITAL_COMMUNITY)
Admission: RE | Admit: 2012-09-10 | Discharge: 2012-09-10 | Disposition: A | Discharge status: Home / Self Care | Payer: Medicare PPO | Source: Ambulatory Visit | Attending: Pulmonary Disease | Admitting: Pulmonary Disease

## 2012-09-10 NOTE — Progress Notes (Signed)
Physical Therapy Treatment Patient Details  Name: Felicia Wells MRN: [id number] Date of Birth: Dec 31, 1918  Today's Date: 09/10/2012 Time: 1105-1200 PT Time Calculation (min): 55 min  Visit#: 4  of 8   Re-eval: 10/03/12  Charge: Gait 12', therex 40'  Authorization: Medicare  Authorization Time Period:    Authorization Visit#: 4  of 10    Subjective: Symptoms/Limitations Symptoms: Pt stated she has increased LB pain with standing activities Pain Assessment Currently in Pain?: Yes Pain Score:   5 Pain Location: Hip Pain Orientation: Right  Objective:   Exercise/Treatments Aerobic Tread Mill: Gait trainer L 89 R 91 8' total with 3 restbreaks .45--.50 cyc/sec Standing Heel Raises: 15 reps;Limitations Heel Raises Limitations: toeraises 15 reps Functional Squat: 15 reps Gait Training: gait training around dept, without AD Other Standing Knee Exercises: hip abd, hip extension 10 reps bilaterally Seated Other Seated Knee Exercises: 5 STS with no HHA and no assistance required when sitting on airex Supine Bridges: 10 reps Straight Leg Raises: 10 reps;Right Sidelying Hip ABduction: 10 reps;Right Prone  Hip Extension: 10 reps;Right;Left      Physical Therapy Assessment and Plan PT Assessment and Plan Clinical Impression Statement: Began gait trainer on TM to normalize gait mechanics with cueing to increase stride length and reduce L toe dragging, pt limited fatigue with new activity and did require several rest breaks to complete.  Pt able to perform appropriate technique with standing exercises with good form noted with functional squats.  Pt was given HEP worksheet for SLR all directions and bridges to continue strengthening for maximum benefits. PT Plan: Continue with current POC to improve confidence with independent gait, improve LE strength and balacne/activity tolerance    Goals    Problem List Patient Active Problem List  Diagnosis  . Muscle weakness  (generalized)  . Difficulty in walking  . Fracture of acetabulum  . Hypertension  . Meniere's disease    PT - End of Session Equipment Utilized During Treatment: Gait belt Activity Tolerance: Patient tolerated treatment well;Patient limited by fatigue General Behavior During Session: [facility] for tasks performed Cognition: [facility] for tasks performed  GP    Juel Burrow 09/10/2012, 12:18 PM

## 2012-09-13 ENCOUNTER — Ambulatory Visit (HOSPITAL_COMMUNITY)
Admission: RE | Admit: 2012-09-13 | Discharge: 2012-09-13 | Disposition: A | Discharge status: Home / Self Care | Payer: Medicare PPO | Source: Ambulatory Visit | Attending: Pulmonary Disease | Admitting: Pulmonary Disease

## 2012-09-13 NOTE — Progress Notes (Signed)
Physical Therapy Treatment Patient Details  Name: Felicia Wells MRN: [id number] Date of Birth: 1918-12-17  Today's Date: 09/13/2012 Time: 1520-1608 PT Time Calculation (min): 48 min  Visit#: 5  of 8   Re-eval: 10/03/12 Authorization: Medicare  Authorization Visit#: 5  of 10   Charges:  therex 25', gait 15'  Subjective: Symptoms/Limitations Symptoms: Pt. states her knee has been buckling and giving way at times.  States it pops and cracks.  Suggested pt. see orthopedist and ambulate with RW at all times. Pain Assessment Currently in Pain?: Yes Pain Score:   4 Pain Location: Hip Pain Orientation: Right Pain Radiating Towards: and into R medial knee.   Exercise/Treatments Aerobic Tread Mill: Gait trainer L 89 R 91 8' total with 2 restbreaks .45--.50 cyc/sec Standing Heel Raises: 15 reps;Limitations Heel Raises Limitations: toeraises 15 reps Functional Squat: 15 reps Gait Training: gait training around dept, without AD Other Standing Knee Exercises: hip abd, hip extension 15 reps bilaterally Other Standing Knee Exercises: retro, side stepping, tandem 1RT each Seated Other Seated Knee Exercises: 5 STS with no HHA and no assistance required when sitting on airex     Physical Therapy Assessment and Plan PT Assessment and Plan Clinical Impression Statement: Pt. able to complete gait training with increased step length, needing less cues and breaks to complete.  Was able to ambulated 4' before needing rest break.  Able to increase reps of standing exercises with overall less fatigue noted.  Pt. with 2 episodes of grinding/popping in knee with slight buckling while ambulating in dept.  Pt. educated on need for RW due to knee stability being unpredictable.  Pt. voiced compliance.  PT Plan: Continue with current POC to improve confidence with independent gait, improve LE strength and balacne/activity tolerance     Problem List Patient Active Problem List  Diagnosis  . Muscle  weakness (generalized)  . Difficulty in walking  . Fracture of acetabulum  . Hypertension  . Meniere's disease    PT - End of Session Equipment Utilized During Treatment: Gait belt Activity Tolerance: Patient tolerated treatment well;Patient limited by fatigue General Behavior During Session: [facility] for tasks performed Cognition: [facility] for tasks performed    Lurena Nida, PTA/CLT 09/13/2012, 6:36 PM

## 2012-09-15 ENCOUNTER — Ambulatory Visit (HOSPITAL_COMMUNITY)
Admission: RE | Admit: 2012-09-15 | Discharge: 2012-09-15 | Disposition: A | Discharge status: Home / Self Care | Payer: Medicare PPO | Source: Ambulatory Visit | Attending: Pulmonary Disease | Admitting: Pulmonary Disease

## 2012-09-15 NOTE — Progress Notes (Signed)
Physical Therapy Treatment Patient Details  Name: Felicia Wells MRN: [id number] Date of Birth: 1919-08-06  Today's Date: 09/15/2012 Time: 1521-1600 PT Time Calculation (min): 39 min Charges: 23' TE, 16' NMR Visit#: 6  of 8   Re-eval: 10/03/12    Authorization: Medicare  Authorization Time Period:    Authorization Visit#: 6  of 10    Subjective:  Pt reports that her R knee continues to buckle and does not feel safe without RW. However continues to wish to be walking independently.   Exercise/Treatments Standing Heel Raises: 10 reps Heel Raises Limitations: toeraises 10 reps; both w/o HHA for balance Functional Squat: 15 reps (increased R knee pain) Gait Training: gait training in hosptial hallways x19 minutes w/3 RB w/ SPC for activity tolerance. Other Standing Knee Exercises: standing on 4 in step 3x30 sec BLE Seated Other Seated Knee Exercises: 5 STS w/o HHA from low blue auto mat Other Seated Knee Exercises: hip adduction w/pillow 10x10 sec holds   Physical Therapy Assessment and Plan PT Assessment and Plan Clinical Impression statement: Pt able to ambulate w/min guard A x19 min in closed environment on level surfaces with SPC and required 3x2 min. Rest breaks.  Continues to have increased fear of falling due to R knee weakness.  Discussed with pt probability of knee weakness is likely due to decreased gait speed.  Pt had less knee buckling with increased gait speed. Pt also able to complete 2x5 STS from large blue mat surface on lowest level independently.  PT Plan: Continue with current POC to improve confidence with independent gait, improve LE strength and balacne/activity tolerance    Goals    Problem List Patient Active Problem List  Diagnosis  . Muscle weakness (generalized)  . Difficulty in walking  . Fracture of acetabulum  . Hypertension  . Meniere's disease    PT - End of Session Equipment Utilized During Treatment: Gait belt Activity Tolerance:  Patient tolerated treatment well;Patient limited by fatigue General Behavior During Session: [facility] for tasks performed Cognition: [facility] for tasks performed  Oasis Goehring, PT 09/15/2012, 4:10 PM

## 2012-09-17 ENCOUNTER — Ambulatory Visit (HOSPITAL_COMMUNITY)
Admission: RE | Admit: 2012-09-17 | Discharge: 2012-09-17 | Disposition: A | Discharge status: Home / Self Care | Payer: Medicare PPO | Source: Ambulatory Visit | Attending: Pulmonary Disease | Admitting: Pulmonary Disease

## 2012-09-17 NOTE — Progress Notes (Signed)
Physical Therapy Treatment Patient Details  Name: Felicia Wells MRN: [id number] Date of Birth: 07-03-1919  Today's Date: 09/17/2012 Time: 1020-1110 PT Time Calculation (min): 50 min  Visit#: 7  of 8   Re-eval: 10/03/12  Charge: Gait 12', therex 38'  Authorization: Medicare  Authorization Time Period:    Authorization Visit#: 7  of 10    Subjective: Symptoms/Limitations Symptoms: Pt stated she has been trying to STS without her hands at home and is able to do with litltle difficulty.  Little tenderness hip no real pain today. Pain Assessment Currently in Pain?: Yes Pain Score:   1 Pain Location: Hip Pain Orientation: Right  Objective:   Exercise/Treatments Standing Heel Raises: 10 reps Heel Raises Limitations: toeraises 10 reps; both w/o HHA for balance Functional Squat: 15 reps Gait Training: gait training in hosptial hallways x12 minutes w/1RB w/ SPC Other Standing Knee Exercises: standing on 4 in step 3x30 LLE Seated Other Seated Knee Exercises: 5 STS w/o HHA from low blue auto mat; attempted 5 STS from chair- did require min assistance Other Seated Knee Exercises: hip adduction w/pillow 10x10 sec holds    Physical Therapy Assessment and Plan PT Assessment and Plan Clinical Impression Statement: Pt improving gait mechanics with more confidence with less assistive device (SPC), increased cadence with no LOB episodes through gait training and only 1 rest break required PT Plan: Continue with current POC to improve confidence with independent gait, improve LE strength and balacne/activity tolerance    Goals    Problem List Patient Active Problem List  Diagnosis  . Muscle weakness (generalized)  . Difficulty in walking  . Fracture of acetabulum  . Hypertension  . Meniere's disease    PT - End of Session Equipment Utilized During Treatment: Gait belt Activity Tolerance: Patient tolerated treatment well General Behavior During Session: [facility] for tasks  performed Cognition: [facility] for tasks performed  GP    Juel Burrow 09/17/2012, 12:57 PM

## 2012-09-18 ENCOUNTER — Encounter (HOSPITAL_COMMUNITY): Payer: Self-pay | Admitting: *Deleted

## 2012-09-18 ENCOUNTER — Emergency Department (HOSPITAL_COMMUNITY): Payer: Medicare PPO

## 2012-09-18 ENCOUNTER — Emergency Department (HOSPITAL_COMMUNITY)
Admission: EM | Admit: 2012-09-18 | Discharge: 2012-09-18 | Disposition: A | Discharge status: Home / Self Care | Payer: Medicare PPO | Attending: Emergency Medicine | Admitting: Emergency Medicine

## 2012-09-18 DIAGNOSIS — N3090 Cystitis, unspecified without hematuria: Secondary | ICD-10-CM | POA: Insufficient documentation

## 2012-09-18 DIAGNOSIS — Z79899 Other long term (current) drug therapy: Secondary | ICD-10-CM | POA: Insufficient documentation

## 2012-09-18 DIAGNOSIS — R509 Fever, unspecified: Secondary | ICD-10-CM | POA: Insufficient documentation

## 2012-09-18 DIAGNOSIS — M810 Age-related osteoporosis without current pathological fracture: Secondary | ICD-10-CM | POA: Insufficient documentation

## 2012-09-18 DIAGNOSIS — Z8669 Personal history of other diseases of the nervous system and sense organs: Secondary | ICD-10-CM | POA: Insufficient documentation

## 2012-09-18 DIAGNOSIS — Z87440 Personal history of urinary (tract) infections: Secondary | ICD-10-CM | POA: Insufficient documentation

## 2012-09-18 DIAGNOSIS — R300 Dysuria: Secondary | ICD-10-CM | POA: Insufficient documentation

## 2012-09-18 DIAGNOSIS — R5381 Other malaise: Secondary | ICD-10-CM | POA: Insufficient documentation

## 2012-09-18 LAB — BASIC METABOLIC PANEL
BUN: 15 mg/dL (ref 6–23)
CO2: 32 mEq/L (ref 19–32)
Calcium: 10.1 mg/dL (ref 8.4–10.5)
Creatinine, Ser: 0.78 mg/dL (ref 0.50–1.10)
Glucose, Bld: 117 mg/dL — ABNORMAL HIGH (ref 70–99)
Potassium: 4.1 mEq/L (ref 3.5–5.1)
Sodium: 133 mEq/L — ABNORMAL LOW (ref 135–145)

## 2012-09-18 LAB — URINALYSIS, ROUTINE W REFLEX MICROSCOPIC
Bilirubin Urine: NEGATIVE
Ketones, ur: NEGATIVE mg/dL
Leukocytes, UA: NEGATIVE
Nitrite: NEGATIVE
Protein, ur: NEGATIVE mg/dL
Urobilinogen, UA: 0.2 mg/dL (ref 0.0–1.0)
pH: 7 (ref 5.0–8.0)

## 2012-09-18 LAB — CBC WITH DIFFERENTIAL/PLATELET
Eosinophils Absolute: 0 10*3/uL (ref 0.0–0.7)
Eosinophils Relative: 0 % (ref 0–5)
Lymphs Abs: 1.1 10*3/uL (ref 0.7–4.0)
MCH: 32.2 pg (ref 26.0–34.0)
MCV: 94.8 fL (ref 78.0–100.0)
Platelets: 258 10*3/uL (ref 150–400)
RBC: 4.6 MIL/uL (ref 3.87–5.11)
RDW: 14.7 % (ref 11.5–15.5)

## 2012-09-18 LAB — LACTIC ACID, PLASMA: Lactic Acid, Venous: 1 mmol/L (ref 0.5–2.2)

## 2012-09-18 MED ORDER — CEPHALEXIN 500 MG PO CAPS: 500.0000 mg | ORAL_CAPSULE | Freq: Four times a day (QID) | ORAL | Status: DC

## 2012-09-18 MED ORDER — ACETAMINOPHEN 500 MG PO TABS
500.0000 mg | ORAL_TABLET | Freq: Once | ORAL | Status: AC
  Administered 2012-09-18: 500 mg via ORAL
  Filled 2012-09-18: qty 1

## 2012-09-18 MED ORDER — ACETAMINOPHEN 325 MG PO TABS: 650.0000 mg | ORAL_TABLET | Freq: Once | ORAL | Status: DC

## 2012-09-18 NOTE — ED Provider Notes (Signed)
History     CSN: [id number]  Arrival date & time 09/18/12  1334   First MD Initiated Contact with Patient 09/18/12 1346      Chief Complaint  Patient presents with  . Fatigue  . Urinary Frequency  . Chills     HPI Pt was seen at 1355.  Per pt and her family, c/o gradual onset and persistence of constant dysuria, urinary frequency and urgency since last night.  Has been associated with intermittent home fevers to "101" and generalized weakness/fatigue.  Pt's family describes her symptoms as "like when she gets a UTI." States she has been taking OTC tylenol with improvement in fever.  Denies flank pain, no hematuria, no rash, no CP/SOB, no cough, no abd pain, no back pain, no N/V/D, no vaginal bleeding/discharge.    Had her flu shot this year Past Medical History  Diagnosis Date  . Infections of kidney   . Hypertension   . Osteoporosis   . Eye problem   . Anxiety     Past Surgical History  Procedure Date  . Eye surgery   . Tonsillectomy   . Thyroid surgery   . Dilation and curettage of uterus   . Brain shunt   . Hernia repair   . Cataract extraction   . Foot surgery     History  Substance Use Topics  . Smoking status: Never Smoker   . Smokeless tobacco: Not on file  . Alcohol Use: No    Review of Systems ROS: Statement: All systems negative except as marked or noted in the HPI; Constitutional: +fever and chills. ; ; Eyes: Negative for eye pain, redness and discharge. ; ; ENMT: Negative for ear pain, hoarseness, nasal congestion, sinus pressure and sore throat. ; ; Cardiovascular: Negative for chest pain, palpitations, diaphoresis, dyspnea and peripheral edema. ; ; Respiratory: Negative for cough, wheezing and stridor. ; ; Gastrointestinal: Negative for nausea, vomiting, diarrhea, abdominal pain, blood in stool, hematemesis, jaundice and rectal bleeding. . ; ; Genitourinary: +dysuria, urinary frequency and urgency. Negative for flank pain and hematuria. ; ;  Musculoskeletal: Negative for back pain and neck pain. Negative for swelling and trauma.; ; Skin: Negative for pruritus, rash, abrasions, blisters, bruising and skin lesion.; ; Neuro: Negative for headache, lightheadedness and neck stiffness. Negative for altered level of consciousness , altered mental status, extremity weakness, paresthesias, involuntary movement, seizure and syncope.       Allergies  Codeine; Darvocet; Demerol; Other; and Sulfa antibiotics  Home Medications   Current Outpatient Rx  Name  Route  Sig  Dispense  Refill  . HYDRALAZINE HCL 10 MG PO TABS   Oral   Take 10 mg by mouth 2 (two) times daily.         . NEBIVOLOL HCL 20 MG PO TABS   Oral   Take 20 mg by mouth 2 (two) times daily.         Marland Kitchen POTASSIUM CHLORIDE ER 10 MEQ PO TBCR   Oral   Take 10 mEq by mouth 2 (two) times daily.         . TELMISARTAN 80 MG PO TABS   Oral   Take 80 mg by mouth daily.         Marland Kitchen TIMOLOL MALEATE 0.5 % OP SOLN   Left Eye   Place 2 drops into the left eye 2 (two) times daily.         . CEPHALEXIN 500 MG PO CAPS   Oral  Take 1 capsule (500 mg total) by mouth 4 (four) times daily.   40 capsule   0     BP 139/70  Pulse 75  Temp 101.7 F (38.7 C) (Rectal)  Resp 20  Ht 5\' 3"  (1.6 m)  Wt 126 lb (57.153 kg)  BMI 22.32 kg/m2  SpO2 92%  Physical Exam 1400: Physical examination:  Nursing notes reviewed; Vital signs and O2 SAT reviewed;  Constitutional: Well developed, Well nourished, Well hydrated, In no acute distress; Head:  Normocephalic, atraumatic; Eyes: EOMI, PERRL, No scleral icterus; ENMT: Mouth and pharynx normal, Mucous membranes moist; Neck: Supple, Full range of motion, No lymphadenopathy; Cardiovascular: Regular rate and rhythm, No gallop; Respiratory: Breath sounds clear & equal bilaterally, No rales, rhonchi, wheezes.  Speaking full sentences with ease, Normal respiratory effort/excursion; Chest: Nontender, Movement normal; Abdomen: Soft, +mild  suprapubic tenderness to palp. No rebound or guarding. Nondistended, Normal bowel sounds; Genitourinary: No CVA tenderness; Extremities: Pulses normal, No tenderness, No edema, No calf edema or asymmetry.; Neuro: AA&Ox3, Major CN grossly intact.  Speech clear. No gross focal motor or sensory deficits in extremities.; Skin: Color normal, Warm, Dry.   ED Course  Procedures    MDM  MDM Reviewed: previous chart, nursing note and vitals Reviewed previous: labs Interpretation: labs   Results for orders placed during the hospital encounter of 09/18/12  URINALYSIS, ROUTINE W REFLEX MICROSCOPIC      Component Value Range   Color, Urine YELLOW  YELLOW   APPearance CLEAR  CLEAR   Specific Gravity, Urine 1.010  1.005 - 1.030   pH 7.0  5.0 - 8.0   Glucose, UA NEGATIVE  NEGATIVE mg/dL   Hgb urine dipstick NEGATIVE  NEGATIVE   Bilirubin Urine NEGATIVE  NEGATIVE   Ketones, ur NEGATIVE  NEGATIVE mg/dL   Protein, ur NEGATIVE  NEGATIVE mg/dL   Urobilinogen, UA 0.2  0.0 - 1.0 mg/dL   Nitrite NEGATIVE  NEGATIVE   Leukocytes, UA NEGATIVE  NEGATIVE  LACTIC ACID, PLASMA      Component Value Range   Lactic Acid, Venous 1.0  0.5 - 2.2 mmol/L  BASIC METABOLIC PANEL      Component Value Range   Sodium 133 (*) 135 - 145 mEq/L   Potassium 4.1  3.5 - 5.1 mEq/L   Chloride 94 (*) 96 - 112 mEq/L   CO2 32  19 - 32 mEq/L   Glucose, Bld 117 (*) 70 - 99 mg/dL   BUN 15  6 - 23 mg/dL   Creatinine, Ser 4.09  0.50 - 1.10 mg/dL   Calcium 81.1  8.4 - 91.4 mg/dL   GFR calc non Af Amer 71 (*) >90 mL/min   GFR calc Af Amer 82 (*) >90 mL/min  CBC WITH DIFFERENTIAL      Component Value Range   WBC 16.0 (*) 4.0 - 10.5 K/uL   RBC 4.60  3.87 - 5.11 MIL/uL   Hemoglobin 14.8  12.0 - 15.0 g/dL   HCT 78.2  95.6 - 21.3 %   MCV 94.8  78.0 - 100.0 fL   MCH 32.2  26.0 - 34.0 pg   MCHC 33.9  30.0 - 36.0 g/dL   RDW 08.6  57.8 - 46.9 %   Platelets 258  150 - 400 K/uL   Neutrophils Relative 83 (*) 43 - 77 %   Neutro Abs  13.3 (*) 1.7 - 7.7 K/uL   Lymphocytes Relative 7 (*) 12 - 46 %   Lymphs Abs 1.1  0.7 - 4.0 K/uL   Monocytes Relative 9  3 - 12 %   Monocytes Absolute 1.5 (*) 0.1 - 1.0 K/uL   Eosinophils Relative 0  0 - 5 %   Eosinophils Absolute 0.0  0.0 - 0.7 K/uL   Basophils Relative 0  0 - 1 %   Basophils Absolute 0.1  0.0 - 0.1 K/uL   Dg Chest 2 View 09/18/2012  *RADIOLOGY REPORT*  Clinical Data: Chills.  Urinary frequency.  Generalized weakness.  CHEST - 2 VIEW  Comparison: 07/21/2012  Findings: Mild hyperinflation. Lateral view degraded by patient arm position.  Patient rotated minimally right.  Mild cardiomegaly with tortuous thoracic aorta. No pleural effusion or pneumothorax.  No congestive failure.  Clear lungs.  IMPRESSION: Cardiomegaly and hyperinflation; no acute disease.   Original Report Authenticated By: Jeronimo Greaves, M.D.    Results for SHENIYA, GARCIAPEREZ (MRN [id number]) as of 09/18/2012 17:00  Ref. Range 07/13/2010 21:15 10/06/2011 12:58 07/18/2012 23:27 09/18/2012 14:07  Sodium Latest Range: 135-145 mEq/L 136 126 (L) 130 (L) 133 (L)    1650:  Pt not orthostatic.  Has tol PO well while in the ED without N/V. No stooling while in the ED. Has ambulated with steady gait with her walker per her baseline, per family watching her ambulate.  Pt states she "feels ok" and wants to go home now.  No clear UTI on Udip, but pt with multiple symptoms indicating cystitis.  Will tx with keflex while UC pending.  Dx and testing, as well as rationale for abx while UC pending, d/w pt and family.  Questions answered.  Verb understanding, agreeable to d/c home with outpt f/u.          Laray Anger, DO 09/20/12 1452

## 2012-09-18 NOTE — ED Notes (Signed)
Pt states chills, urinary frequency and generalized weakness.

## 2012-09-20 ENCOUNTER — Ambulatory Visit (HOSPITAL_COMMUNITY): Payer: Medicare Other | Admitting: Physical Therapy

## 2012-09-21 LAB — URINE CULTURE

## 2012-09-22 ENCOUNTER — Ambulatory Visit (HOSPITAL_COMMUNITY): Payer: Medicare Other

## 2012-09-23 IMAGING — CT CT HEAD W/O CM
1 series · 15 of 30 positions shown, 19 images · non-contrast
Comparison: 06/25/2008

CLINICAL DATA: Weakness.  Hypertension.

CT HEAD WITHOUT CONTRAST
TECHNIQUE: Contiguous axial images were obtained from the base of
the skull through the vertex without contrast.

[Series 2: headseq 4.8 h37s · axial · 0.43mm/px · z∈[+94,+249]mm · 15 of 36 slices shown, 19 images]
[im 2/36  brain]
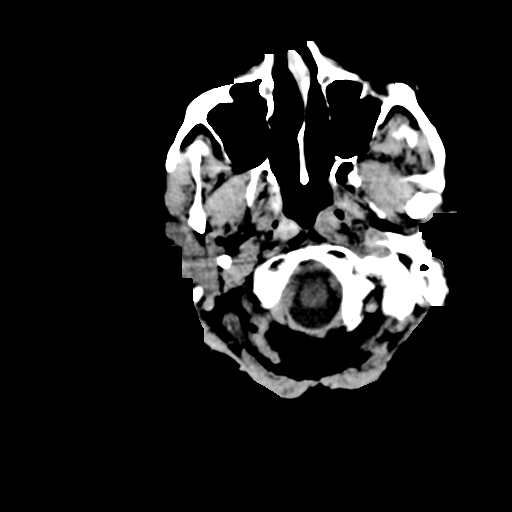
[im 2/36  bone]
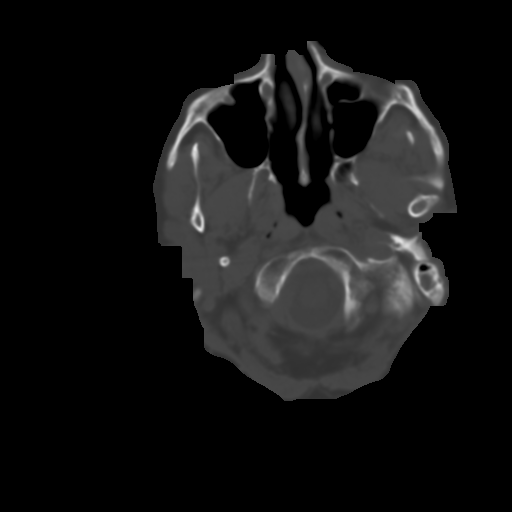
[im 4/36  brain]
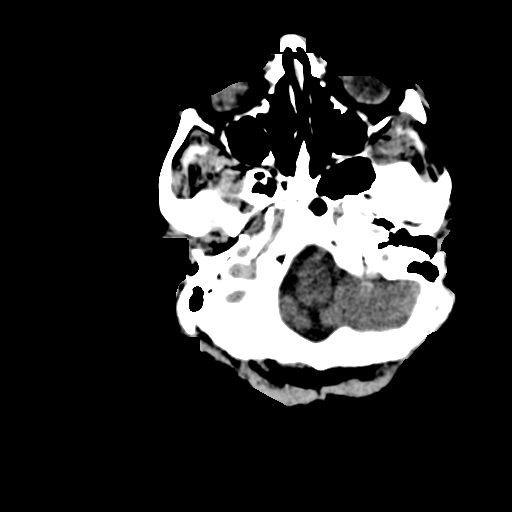
[im 7/36  brain]
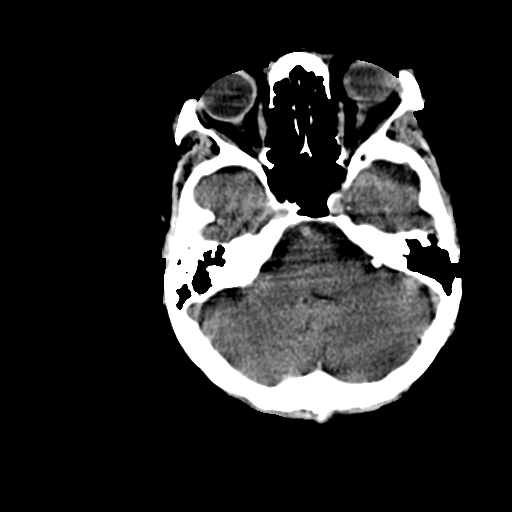
[im 9/36  brain]
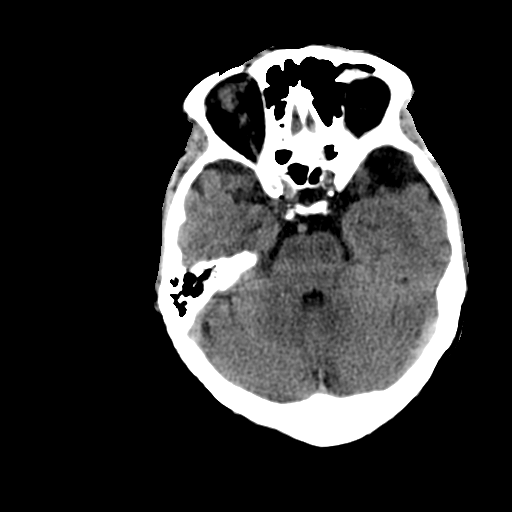
[im 11/36  brain]
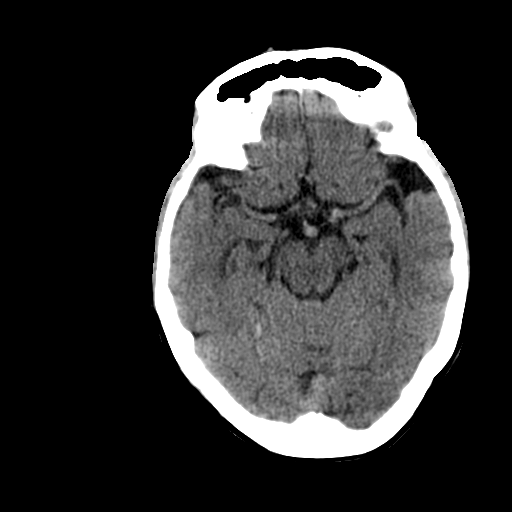
[im 11/36  bone]
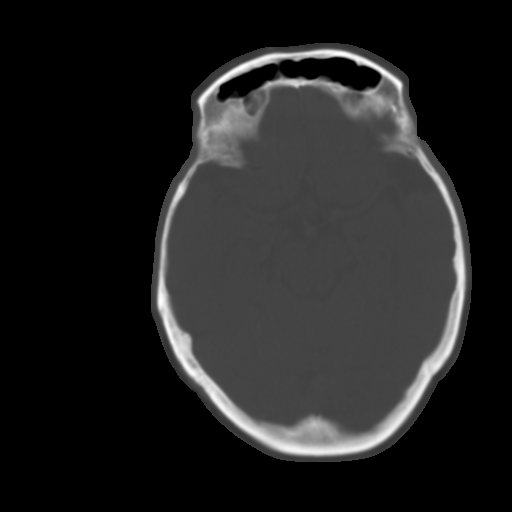
[im 14/36  brain]
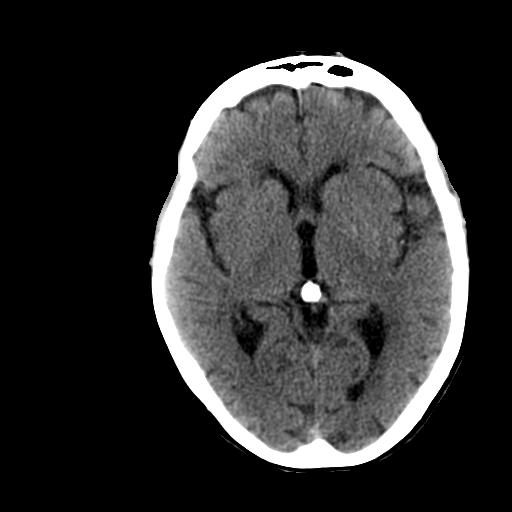
[im 16/36  brain]
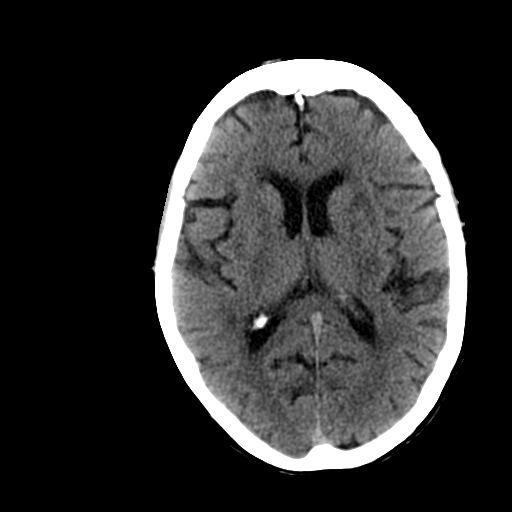
[im 19/36  brain]
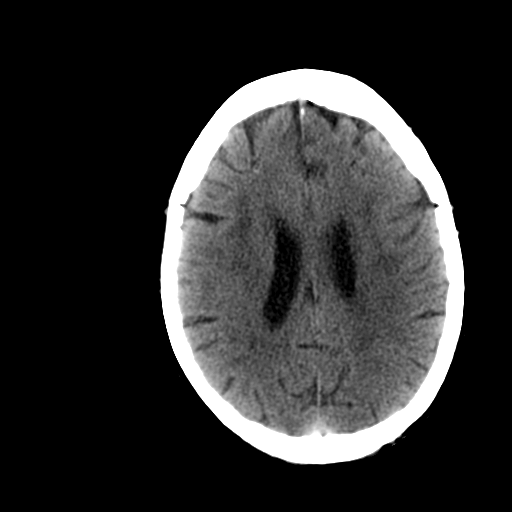
[im 20/36  brain]
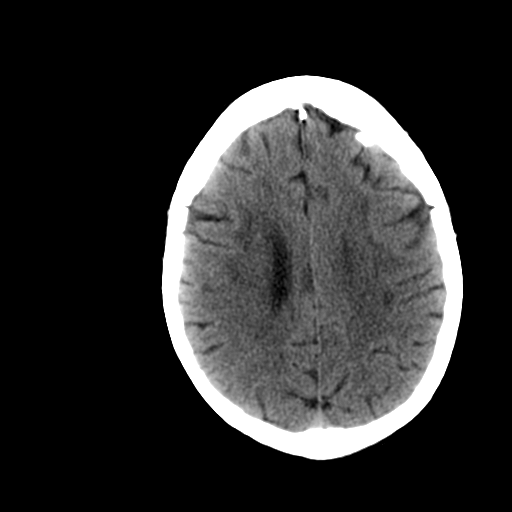
[im 20/36  bone]
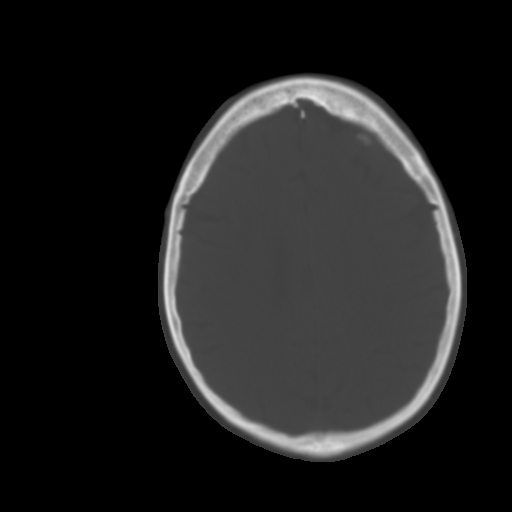
[im 22/36  brain]
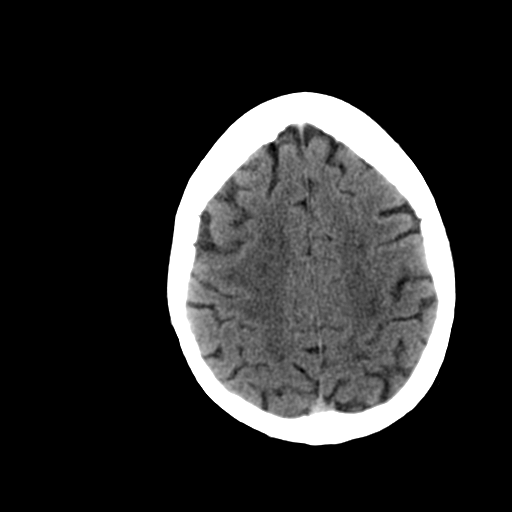
[im 25/36  brain]
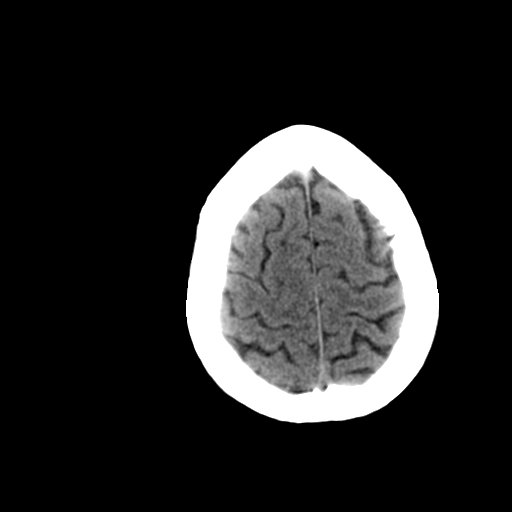
[im 27/36  brain]
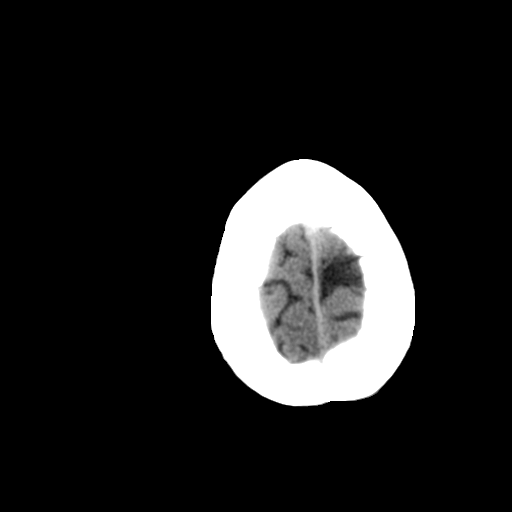
[im 29/36  brain]
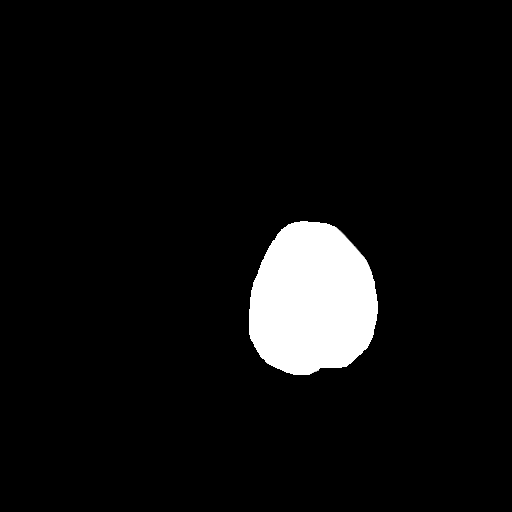
[im 29/36  bone]
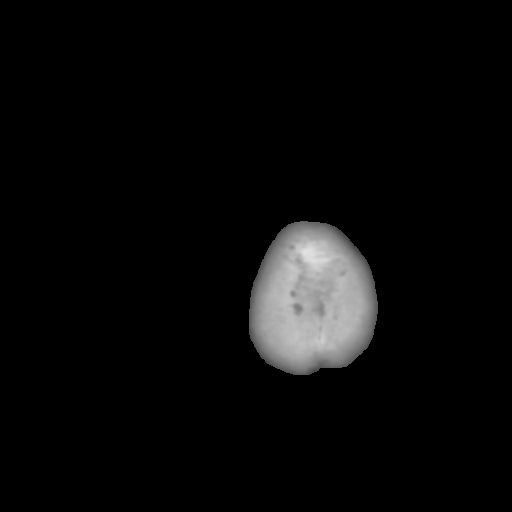
[im 32/36  brain]
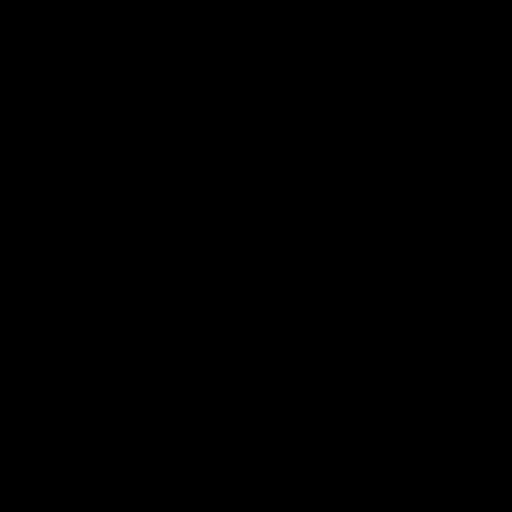
[im 34/36  brain]
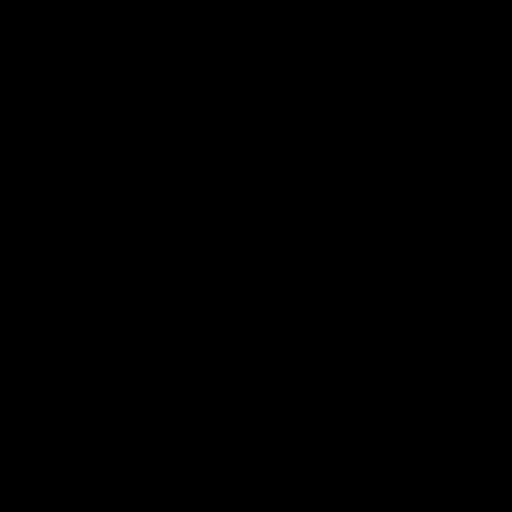

[15 of 30 positions shown; findings below may reference images not displayed]

FINDINGS: I suspect a tiny remote lacunar infarct left thalamus on
image 15 of series 2.

Periventricular and corona radiata white matter hypodensities are
most compatible with chronic ischemic microvascular white matter
disease.

There is prominent CSF space anterior to the left temporal lobe,
possibly an arachnoid cyst or epidermoid.

Otherwise, the brain stem, cerebellum, cerebral peduncles, thalami,
basal ganglia, basilar cisterns, and ventricular system appear
unremarkable.

No intracranial hemorrhage, acute CVA, or other mass lesion
identified.

Left mastoidectomy noted.
IMPRESSION: 1. Periventricular and corona radiata white matter hypodensities
are most compatible with chronic ischemic microvascular white
matter disease.
2.  Suspected epidermoid or arachnoid cyst anteriorly in the left
middle cranial fossa, chronic.
3.  Left mastoidectomy.
4.  No acute findings.

## 2012-09-24 ENCOUNTER — Ambulatory Visit (HOSPITAL_COMMUNITY): Payer: Medicare Other

## 2012-09-27 ENCOUNTER — Ambulatory Visit (HOSPITAL_COMMUNITY): Payer: Medicare Other | Admitting: Physical Therapy

## 2012-09-28 ENCOUNTER — Ambulatory Visit (INDEPENDENT_AMBULATORY_CARE_PROVIDER_SITE_OTHER): Payer: Medicare PPO | Admitting: Urology

## 2012-09-28 DIAGNOSIS — N8111 Cystocele, midline: Secondary | ICD-10-CM

## 2012-09-29 ENCOUNTER — Ambulatory Visit (HOSPITAL_COMMUNITY): Payer: Medicare Other

## 2012-10-01 ENCOUNTER — Ambulatory Visit (HOSPITAL_COMMUNITY): Payer: Medicare Other

## 2012-11-22 ENCOUNTER — Other Ambulatory Visit (HOSPITAL_COMMUNITY): Payer: Self-pay | Admitting: Pulmonary Disease

## 2012-11-22 ENCOUNTER — Ambulatory Visit (HOSPITAL_COMMUNITY)
Admission: RE | Admit: 2012-11-22 | Discharge: 2012-11-22 | Disposition: A | Discharge status: Home / Self Care | Payer: Medicare PPO | Source: Ambulatory Visit | Attending: Pulmonary Disease | Admitting: Pulmonary Disease

## 2012-11-22 DIAGNOSIS — J40 Bronchitis, not specified as acute or chronic: Secondary | ICD-10-CM

## 2012-11-22 DIAGNOSIS — R059 Cough, unspecified: Secondary | ICD-10-CM | POA: Insufficient documentation

## 2012-11-22 DIAGNOSIS — R05 Cough: Secondary | ICD-10-CM | POA: Insufficient documentation

## 2012-11-25 ENCOUNTER — Ambulatory Visit (HOSPITAL_COMMUNITY)
Admission: RE | Admit: 2012-11-25 | Discharge: 2012-11-25 | Disposition: A | Discharge status: Home / Self Care | Payer: Medicare PPO | Source: Ambulatory Visit | Attending: Pulmonary Disease | Admitting: Pulmonary Disease

## 2012-11-25 DIAGNOSIS — M6281 Muscle weakness (generalized): Secondary | ICD-10-CM | POA: Insufficient documentation

## 2012-11-25 DIAGNOSIS — IMO0001 Reserved for inherently not codable concepts without codable children: Secondary | ICD-10-CM | POA: Insufficient documentation

## 2012-11-25 DIAGNOSIS — I10 Essential (primary) hypertension: Secondary | ICD-10-CM | POA: Insufficient documentation

## 2012-11-25 DIAGNOSIS — R262 Difficulty in walking, not elsewhere classified: Secondary | ICD-10-CM | POA: Insufficient documentation

## 2012-11-25 NOTE — Evaluation (Signed)
Physical Therapy Evaluation  Patient Details  Name: Felicia Wells MRN: [id number] Date of Birth: 11/18/1918  Today's Date: 11/25/2012 Time: 4098-1191 PT Time Calculation (min): 60 min Charges: 1 evaluation             Visit#: 1 of 1  Re-eval:   Assessment Diagnosis: Generalized weakness Next MD Visit: Dr Felicia Wells  Authorization:      Authorization Time Period:    Authorization Visit#: 1 of 1   Past Medical History:  Past Medical History  Diagnosis Date  . Infections of kidney   . Hypertension   . Osteoporosis   . Eye problem   . Anxiety    Past Surgical History:  Past Surgical History  Procedure Laterality Date  . Eye surgery    . Tonsillectomy    . Thyroid surgery    . Dilation and curettage of uterus    . Brain shunt    . Hernia repair    . Cataract extraction    . Foot surgery      Subjective Symptoms/Limitations Symptoms: Pt is present today with her son.  Son reports that his father is "worn out".  They would like information about how to be placed back into the [facility] to so that she can gain strength in order to decreae burden of care.  She reports that she is taking longer to bathe and get dressed.  She is having incontient issues, she has coughing spells.  They have sought help from [facility], however pt husband unwilling to move.   Pertinent History: previous pt after a Rt acetabular fracture from a fall on 07/18/12 and attended 7 OP PT visits.  Patient Stated Goals: Pt and son request information on ALF.  Pain Assessment Currently in Pain?: No/denies  Prior Functioning  Home Living Lives With: Spouse Type of Home: House Prior Function Able to Take Stairs?: Reciprically Driving: Yes  Mobility/Balance  Ambulation/Gait Ambulation/Gait: Yes Ambulation/Gait Assistance: 4: Min assist Assistive device: Straight cane     Physical Therapy Assessment and Plan PT Assessment and Plan Clinical Impression Statement: Felicia Wells and her  son are present today for inital evaluation.  During interview process it is found that she has decreased her overall mobility and is suffering moderate cognitivie challenges which has increased burden of care to her husband (49 years old) and her son.  At this time proved pt and son with information for agencies for personal care attendents and discussed respite care from ALF which could assist with strength for 1 month.  Discussed advantages and financial strategies to afford ALF costs.  At this time pt is not willing to live at ALF.  At this time pt and son will seek help from ALF.  Will d/c pt at this time.  PT Plan: D/C    Problem List Patient Active Problem List  Diagnosis  . Muscle weakness (generalized)  . Difficulty in walking  . Fracture of acetabulum  . Hypertension  . Meniere's disease   GP Functional Limitation: Mobility: Walking and moving around Mobility: Walking and Moving Around Current Status [phone]): At least 60 percent but less than 80 percent impaired, limited or restricted Mobility: Walking and Moving Around Goal Status [phone]): At least 60 percent but less than 80 percent impaired, limited or restricted Mobility: Walking and Moving Around Discharge Status [phone]): At least 60 percent but less than 80 percent impaired, limited or restricted  Felicia Wells, PT 11/25/2012, 4:35 PM  Physician Documentation Your signature is  required to indicate approval of the treatment plan as stated above.  Please sign and either send electronically or make a copy of this report for your files and return this physician signed original.   Please mark one 1.__approve of plan  2. ___approve of plan with the following conditions.   ______________________________                                                          _____________________ Physician Signature                                                                                                             Date

## 2012-12-02 ENCOUNTER — Inpatient Hospital Stay (HOSPITAL_COMMUNITY): Payer: Medicare PPO

## 2012-12-02 ENCOUNTER — Encounter (HOSPITAL_COMMUNITY): Payer: Self-pay

## 2012-12-02 ENCOUNTER — Inpatient Hospital Stay (HOSPITAL_COMMUNITY)
Admission: AD | Admit: 2012-12-02 | Discharge: 2012-12-04 | DRG: 194 | Disposition: A | Discharge status: Nursing Home (Includes ICF) | Payer: Medicare PPO | Source: Ambulatory Visit | Attending: Pulmonary Disease | Admitting: Pulmonary Disease

## 2012-12-02 DIAGNOSIS — R410 Disorientation, unspecified: Secondary | ICD-10-CM | POA: Diagnosis present

## 2012-12-02 DIAGNOSIS — Z882 Allergy status to sulfonamides status: Secondary | ICD-10-CM

## 2012-12-02 DIAGNOSIS — R404 Transient alteration of awareness: Secondary | ICD-10-CM | POA: Diagnosis present

## 2012-12-02 DIAGNOSIS — Z7982 Long term (current) use of aspirin: Secondary | ICD-10-CM

## 2012-12-02 DIAGNOSIS — E871 Hypo-osmolality and hyponatremia: Secondary | ICD-10-CM | POA: Diagnosis present

## 2012-12-02 DIAGNOSIS — Z87440 Personal history of urinary (tract) infections: Secondary | ICD-10-CM

## 2012-12-02 DIAGNOSIS — Z79899 Other long term (current) drug therapy: Secondary | ICD-10-CM

## 2012-12-02 DIAGNOSIS — R627 Adult failure to thrive: Secondary | ICD-10-CM | POA: Diagnosis present

## 2012-12-02 DIAGNOSIS — H8109 Meniere's disease, unspecified ear: Secondary | ICD-10-CM | POA: Diagnosis present

## 2012-12-02 DIAGNOSIS — Z886 Allergy status to analgesic agent status: Secondary | ICD-10-CM

## 2012-12-02 DIAGNOSIS — I10 Essential (primary) hypertension: Secondary | ICD-10-CM | POA: Diagnosis present

## 2012-12-02 DIAGNOSIS — R29898 Other symptoms and signs involving the musculoskeletal system: Secondary | ICD-10-CM | POA: Diagnosis present

## 2012-12-02 DIAGNOSIS — J189 Pneumonia, unspecified organism: Principal | ICD-10-CM | POA: Diagnosis present

## 2012-12-02 DIAGNOSIS — M810 Age-related osteoporosis without current pathological fracture: Secondary | ICD-10-CM | POA: Diagnosis present

## 2012-12-02 DIAGNOSIS — F411 Generalized anxiety disorder: Secondary | ICD-10-CM | POA: Diagnosis present

## 2012-12-02 DIAGNOSIS — Z885 Allergy status to narcotic agent status: Secondary | ICD-10-CM

## 2012-12-02 DIAGNOSIS — R262 Difficulty in walking, not elsewhere classified: Secondary | ICD-10-CM | POA: Diagnosis present

## 2012-12-02 LAB — COMPREHENSIVE METABOLIC PANEL
CO2: 30 mEq/L (ref 19–32)
Calcium: 8.8 mg/dL (ref 8.4–10.5)
Creatinine, Ser: 0.76 mg/dL (ref 0.50–1.10)
GFR calc Af Amer: 83 mL/min — ABNORMAL LOW (ref >90)
GFR calc non Af Amer: 72 mL/min — ABNORMAL LOW (ref >90)
Glucose, Bld: 88 mg/dL (ref 70–99)
Total Protein: 6 g/dL (ref 6.0–8.3)

## 2012-12-02 LAB — CBC WITH DIFFERENTIAL/PLATELET
Hemoglobin: 13.6 g/dL (ref 12.0–15.0)
Lymphocytes Relative: 21 % (ref 12–46)
Lymphs Abs: 2 10*3/uL (ref 0.7–4.0)
Monocytes Relative: 8 % (ref 3–12)
Neutro Abs: 6.3 10*3/uL (ref 1.7–7.7)
Neutrophils Relative %: 68 % (ref 43–77)
RBC: 4.26 MIL/uL (ref 3.87–5.11)

## 2012-12-02 MED ORDER — ENOXAPARIN SODIUM 40 MG/0.4ML ~~LOC~~ SOLN
40.0000 mg | SUBCUTANEOUS | Status: DC
  Administered 2012-12-02 – 2012-12-03 (×2): 40 mg via SUBCUTANEOUS
  Filled 2012-12-02 (×2): qty 0.4

## 2012-12-02 MED ORDER — ONDANSETRON HCL 4 MG PO TABS: 4.0000 mg | ORAL_TABLET | Freq: Four times a day (QID) | ORAL | Status: DC | PRN

## 2012-12-02 MED ORDER — HYDRALAZINE HCL 10 MG PO TABS
10.0000 mg | ORAL_TABLET | Freq: Two times a day (BID) | ORAL | Status: DC
  Administered 2012-12-02 – 2012-12-04 (×4): 10 mg via ORAL
  Filled 2012-12-02 (×6): qty 1

## 2012-12-02 MED ORDER — GUAIFENESIN ER 600 MG PO TB12
600.0000 mg | ORAL_TABLET | Freq: Two times a day (BID) | ORAL | Status: DC
  Administered 2012-12-02 – 2012-12-04 (×5): 600 mg via ORAL
  Filled 2012-12-02 (×5): qty 1

## 2012-12-02 MED ORDER — ACETAMINOPHEN 325 MG PO TABS: 650.0000 mg | ORAL_TABLET | Freq: Four times a day (QID) | ORAL | Status: DC | PRN

## 2012-12-02 MED ORDER — ALBUTEROL SULFATE (5 MG/ML) 0.5% IN NEBU
2.5000 mg | INHALATION_SOLUTION | Freq: Four times a day (QID) | RESPIRATORY_TRACT | Status: DC
  Administered 2012-12-02 – 2012-12-04 (×5): 2.5 mg via RESPIRATORY_TRACT
  Filled 2012-12-02 (×6): qty 0.5

## 2012-12-02 MED ORDER — ONDANSETRON HCL 4 MG/2ML IJ SOLN: 4.0000 mg | Freq: Four times a day (QID) | INTRAMUSCULAR | Status: DC | PRN

## 2012-12-02 MED ORDER — TIMOLOL MALEATE 0.5 % OP SOLN
2.0000 [drp] | Freq: Two times a day (BID) | OPHTHALMIC | Status: DC
  Administered 2012-12-02 – 2012-12-04 (×4): 2 [drp] via OPHTHALMIC
  Filled 2012-12-02: qty 5

## 2012-12-02 MED ORDER — NEBIVOLOL HCL 10 MG PO TABS
20.0000 mg | ORAL_TABLET | Freq: Two times a day (BID) | ORAL | Status: DC
  Administered 2012-12-02 – 2012-12-04 (×4): 20 mg via ORAL
  Filled 2012-12-02 (×9): qty 2

## 2012-12-02 MED ORDER — ACETAMINOPHEN 650 MG RE SUPP: 650.0000 mg | Freq: Four times a day (QID) | RECTAL | Status: DC | PRN

## 2012-12-02 MED ORDER — ALUM & MAG HYDROXIDE-SIMETH 200-200-20 MG/5ML PO SUSP: 30.0000 mL | Freq: Four times a day (QID) | ORAL | Status: DC | PRN

## 2012-12-02 MED ORDER — IRBESARTAN 300 MG PO TABS
300.0000 mg | ORAL_TABLET | Freq: Every day | ORAL | Status: DC
  Administered 2012-12-02 – 2012-12-04 (×3): 300 mg via ORAL
  Filled 2012-12-02 (×3): qty 1

## 2012-12-02 MED ORDER — DEXTROSE 5 % IV SOLN
500.0000 mg | INTRAVENOUS | Status: DC
  Administered 2012-12-02 – 2012-12-03 (×2): 500 mg via INTRAVENOUS
  Filled 2012-12-02 (×5): qty 500

## 2012-12-02 MED ORDER — DEXTROSE 5 % IV SOLN
1.0000 g | INTRAVENOUS | Status: DC
  Administered 2012-12-02 – 2012-12-03 (×2): 1 g via INTRAVENOUS
  Filled 2012-12-02 (×5): qty 10

## 2012-12-02 MED ORDER — SODIUM CHLORIDE 0.9 % IV SOLN
INTRAVENOUS | Status: DC
  Administered 2012-12-02 – 2012-12-04 (×4): via INTRAVENOUS

## 2012-12-02 MED ORDER — PANTOPRAZOLE SODIUM 40 MG PO TBEC
40.0000 mg | DELAYED_RELEASE_TABLET | Freq: Every day | ORAL | Status: DC
  Administered 2012-12-02 – 2012-12-04 (×3): 40 mg via ORAL
  Filled 2012-12-02 (×3): qty 1

## 2012-12-02 NOTE — Evaluation (Signed)
Physical Therapy Evaluation Patient Details Name: Felicia Wells MRN: [id number] DOB: 12-04-1918 Today's Date: 12/02/2012 Time: 9147-8295 PT Time Calculation (min): 43 min  PT Assessment / Plan / Recommendation Clinical Impression  Pt was seen for evaluation.  She is an extremely pleasant and cooperative woman who lives with her 77 year old husband.  She does admit to some memory loss, however she is cognitively very functional.  Per her report, she is independent with ADLs and ambulates with a cane at home.  She still drives.  Her strength on MMT is WNL but she does have mild balance deficiency with  high balance activities which does put her at some risk of falling.  Her endurance is probably WNL for her age, but in general she feels as if she is "tired all over".  She reports an 8# loss of weight recently.  Apparently some event occurred about a week ago which caused some concern on the part of her family that pt was not strong enough to be at home.  Although she is at some risk of falling, she is clearly functioning at too high of a level for SNF.  If the family doesn't want her at home right now, she would be very appropriate for ACLF.  HHPT would be an option, but a more intensive outpatient program would facilitate increased strength more quickly. Ther is no need for inpatient, acute care PT.  We will ask nursing service to ambulate her.         PT Assessment  All further PT needs can be met in the next venue of care    Follow Up Recommendations  Outpatient PT;Home health PT    Does the patient have the potential to tolerate intense rehabilitation      Barriers to Discharge        Equipment Recommendations  None recommended by PT    Recommendations for Other Services     Frequency      Precautions / Restrictions Precautions Precautions: Fall Restrictions Weight Bearing Restrictions: No   Pertinent Vitals/Pain       Mobility  Bed Mobility Bed Mobility: Supine to Sit;Sit  to Supine Supine to Sit: 6: Modified independent (Device/Increase time) Sit to Supine: 6: Modified independent (Device/Increase time) Transfers Transfers: Sit to Stand;Stand to Sit Sit to Stand: 6: Modified independent (Device/Increase time);From bed Stand to Sit: 6: Modified independent (Device/Increase time);To bed Details for Transfer Assistance: pt has slight unsteadiness upon 1st standing at times, however not enough to cause instability or falling Ambulation/Gait Ambulation/Gait Assistance: 5: Supervision Ambulation Distance (Feet): 225 Feet Assistive device: Straight cane Ambulation/Gait Assistance Details: Pt has used a cane for gait since her hip fx last year Gait Pattern: Within Functional Limits Gait velocity: WNL General Gait Details: Pt reports fatigue at time of evaluation and tends to hold onto railing with right hand when walking...she is totally able to amublate without this assist, however Stairs: Yes Stairs Assistance: 4: Min guard Stairs Assistance Details (indicate cue type and reason): the staircase tended to cause pt some dizziness due to the patterns visualized in the stairwell...she should not have any difficulty with steps at home Stair Management Technique: One rail Right;Step to pattern;Forwards;With cane Number of Stairs: 2    Exercises     PT Diagnosis: Generalized weakness  PT Problem List: Decreased strength;Decreased activity tolerance;Decreased mobility;Decreased cognition PT Treatment Interventions:     PT Goals    Visit Information  Last PT Received On: 12/02/12  Subjective Data  Subjective: my body just feels so tired Patient Stated Goal: wants to get stronger   Prior Functioning  Home Living Lives With: Spouse Available Help at Discharge: Family;Available 24 hours/day Type of Home: House Home Access: Stairs to enter [employer] of Steps: 2 Entrance Stairs-Rails: Right Home Layout: One level Bathroom Shower/Tub: Architectural technologist: Standard Home Adaptive Equipment: Bedside commode/3-in-1;Walker - rolling;Straight cane;Shower chair without back;Grab bars in shower Prior Function Level of Independence: Independent with assistive device(s) Able to Take Stairs?: Yes Driving: Yes Vocation: Retired Musician: No difficulties    Copywriter, advertising Overall Cognitive Status: Appears within functional limits for tasks assessed/performed Arousal/Alertness: Awake/alert Orientation Level: Appears intact for tasks assessed Behavior During Session: [facility] for tasks performed    Extremity/Trunk Assessment Right Lower Extremity Assessment RLE ROM/Strength/Tone: WFL for tasks assessed RLE Sensation: WFL - Light Touch RLE Coordination: WFL - gross motor Left Lower Extremity Assessment LLE ROM/Strength/Tone: WFL for tasks assessed LLE Sensation: WFL - Light Touch LLE Coordination: WFL - gross motor   Balance Balance Balance Assessed: Yes High Level Balance High Level Balance Activites: Backward walking;Direction changes;Turns;Sudden stops;Head turns High Level Balance Comments: pt had no LOB with above while using a cane, however she does appear to be frail and could fall if her balance was severely challenged  End of Session PT - End of Session Equipment Utilized During Treatment: Gait belt Activity Tolerance: Patient tolerated treatment well Patient left: in bed;with call bell/phone within reach;with family/visitor present Nurse Communication: Mobility status  GP Functional Assessment Tool Used: clinical judgement Functional Limitation: Mobility: Walking and moving around Mobility: Walking and Moving Around Current Status ([id number]): At least 20 percent but less than 40 percent impaired, limited or restricted Mobility: Walking and Moving Around Goal Status [phone]): At least 20 percent but less than 40 percent impaired, limited or restricted Mobility: Walking and Moving Around  Discharge Status [phone]): At least 20 percent but less than 40 percent impaired, limited or restricted   Konrad Penta 12/02/2012, 3:46 PM

## 2012-12-03 MED ORDER — TUBERCULIN PPD 5 UNIT/0.1ML ID SOLN
5.0000 [IU] | Freq: Once | INTRADERMAL | Status: DC
  Administered 2012-12-03: 5 [IU] via INTRADERMAL
  Filled 2012-12-03: qty 0.1

## 2012-12-03 MED ORDER — ENSURE COMPLETE PO LIQD: 237.0000 mL | Freq: Every day | ORAL | Status: DC

## 2012-12-03 NOTE — Care Management Note (Unsigned)
    Page 1 of 1   12/03/2012     10:37:32 AM   CARE MANAGEMENT NOTE 12/03/2012  Patient:  Felicia Wells, Felicia Wells   Account Number:  [id number]  Date Initiated:  12/03/2012  Documentation initiated by:  Sharrie Rothman  Subjective/Objective Assessment:   Pt admitted from home with possible pneumonia. Pt lives with her husband and the family is looking for placement. Pt requires mod assistance with ADL's.     Action/Plan:   PT consult recommends ALF. [facility] bed for pt. Pt can be discharged over the weekend. CSw to arrange discharge to facility when medically stable.   Anticipated DC Date:  12/05/2012   Anticipated DC Plan:  ASSISTED LIVING / REST HOME  In-house referral  Clinical Social Worker      DC Planning Services  CM consult      Choice offered to / List presented to:             Status of service:  Completed, signed off Medicare Important Message given?   (If response is "NO", the following Medicare IM given date fields will be blank) Date Medicare IM given:   Date Additional Medicare IM given:    Discharge Disposition:    Per UR Regulation:    If discussed at Long Length of Stay Meetings, dates discussed:    Comments:  12/03/12  1030 Arlyss Queen, RN BSN CM

## 2012-12-03 NOTE — H&P (Signed)
Felicia Wells, Felicia Wells              ACCOUNT NO.:  [id number]  MEDICAL RECORD NO.:  [id number]  LOCATION:  A307                          FACILITY:  [facility]  PHYSICIAN:  Elizabella Nolet L. Juanetta Gosling, M.D.DATE OF BIRTH:  01-Jun-1919  DATE OF ADMISSION:  12/02/2012 DATE OF DISCHARGE:  LH                             HISTORY & PHYSICAL   REASON FOR ADMISSION:  Pneumonia/failure to thrive.  HISTORY:  Felicia Wells is a 79 year old, who was in my office about a week ago with cough, congestion, shortness of breath.  She at that time was not felt to need inpatient hospitalization, but she was sent for x-ray which showed that she had atelectasis and then sent for laboratory work which shows that her sodium level was slightly low and her potassium level was slightly up.  She was told to discontinue her potassium supplementation.  She continued on her antibiotics, etc. but she has continued having shortness of breath, cough, congestion, weakness, and has become confused.  She has been having some trouble with not being able to manage her activities of daily living.  She came back to the office today, was found to still be very congested and as mentioned, she was confused, was having difficulty getting around, difficulty with managing her activities of daily living and she was admitted to the hospital for further treatment.  PAST MEDICAL HISTORY:  Positive for chronic and recurrent urinary tract infections, hypertension, osteoporosis, anxiety, and Meniere disease.  Surgically, she has had eye surgery, tonsillectomy, thyroid surgery, D and C, hernia repair, cataract surgery, and foot surgery.  SOCIAL HISTORY:  She is a lifelong nonsmoker.  She lives at home with her husband.  ALLERGIES:  She is allergic to CODEINE, DARVOCET, DEMEROL, and SULFA, ANTIBIOTICS.  MEDICATIONS: 1. Aspirin 81 mg daily. 2. Beta-carotene. 3. Vitamin D. 4. Diazepam 5 mg, she takes half every 8 hours as needed. 5. Hydralazine 10 mg 3  times a day. 6. HCTZ 12.5 mg daily. 7. Multiple vitamins daily. 8. Bystolic 20 mg daily. 9. Omega-3 fatty acids daily. 10.Potassium chloride, which she is off of. 11.Micardis 80 mg daily. 12.Timolol drops, 2 drops twice a day.  REVIEW OF SYSTEMS:  Except as mentioned in her review of systems is negative.  PHYSICAL EXAMINATION:  GENERAL:  Shows that she is awake and alert.  She is mildly confused. VITAL SIGNS:  As recorded. HEENT:  Her pupils are reactive.  Nose and throat are clear. NECK:  Supple without masses, bruits, or JVD. CHEST:  She still has rhonchi bilaterally and some wheezing.  She is coughing extensively during the examination. HEART:  Regular. ABDOMEN:  Soft without masses. EXTREMITIES:  No edema. CENTRAL NERVOUS SYSTEM:  Grossly intact.  She is mildly confused.  Assessment then, she had what appeared to be atelectasis on previous chest x-ray but she is clearly not getting better.  She is having trouble now with not being able to maintain her activities of daily living.  She is still coughing and still congested, and I think she probably has pneumonia.  She had trouble with her sodium level and that will need to be rechecked.  She will be started on IV antibiotics,  have PT  consultation, continue with all of her other treatments.     Shawnie Nicole L. Juanetta Gosling, M.D.     ELH/MEDQ  D:  12/02/2012  T:  12/03/2012  Job:  [id number]

## 2012-12-03 NOTE — Progress Notes (Signed)
TB skin test administered to right forearm. Site to be checked in 48 hours.

## 2012-12-03 NOTE — Progress Notes (Signed)
UR Chart Review Completed  

## 2012-12-03 NOTE — Progress Notes (Signed)
Subjective: She is awake and alert. She is still coughing.  Objective: Vital signs in last 24 hours: Temp:  [97.5 F (36.4 C)-98.3 F (36.8 C)] 97.5 F (36.4 C) (04/04 0552) Pulse Rate:  [57-60] 58 (04/04 0552) Resp:  [18] 18 (04/04 0552) BP: (106-140)/(68-84) 140/84 mmHg (04/04 0552) SpO2:  [93 %-97 %] 93 % (04/04 0740) Weight:  [55.792 kg (123 lb)] 55.792 kg (123 lb) (04/03 1041) Weight change:  Last BM Date: 12/02/12  Intake/Output from previous day: 04/03 0701 - 04/04 0700 In: 1680 [P.O.:240; I.V.:1140; IV Piggyback:300] Out: -   PHYSICAL EXAM General appearance: alert, cooperative and mild distress Resp: rhonchi bilaterally Cardio: regular rate and rhythm, S1, S2 normal, no murmur, click, rub or gallop GI: soft, non-tender; bowel sounds normal; no masses,  no organomegaly Extremities: extremities normal, atraumatic, no cyanosis or edema  Lab Results:    Basic Metabolic Panel:  Recent Labs  40/98/11 1615  NA 128*  K 4.1  CL 93*  CO2 30  GLUCOSE 88  BUN 19  CREATININE 0.76  CALCIUM 8.8   Liver Function Tests:  Recent Labs  12/02/12 1615  AST 18  ALT 14  ALKPHOS 65  BILITOT 0.6  PROT 6.0  ALBUMIN 3.1*   No results found for this basename: LIPASE, AMYLASE,  in the last 72 hours No results found for this basename: AMMONIA,  in the last 72 hours CBC:  Recent Labs  12/02/12 1615  WBC 9.2  NEUTROABS 6.3  HGB 13.6  HCT 40.6  MCV 95.3  PLT 259   Cardiac Enzymes: No results found for this basename: CKTOTAL, CKMB, CKMBINDEX, TROPONINI,  in the last 72 hours BNP: No results found for this basename: PROBNP,  in the last 72 hours D-Dimer: No results found for this basename: DDIMER,  in the last 72 hours CBG: No results found for this basename: GLUCAP,  in the last 72 hours Hemoglobin A1C: No results found for this basename: HGBA1C,  in the last 72 hours Fasting Lipid Panel: No results found for this basename: CHOL, HDL, LDLCALC, TRIG, CHOLHDL,  LDLDIRECT,  in the last 72 hours Thyroid Function Tests:  Recent Labs  12/02/12 1615  TSH 3.517   Anemia Panel: No results found for this basename: VITAMINB12, FOLATE, FERRITIN, TIBC, IRON, RETICCTPCT,  in the last 72 hours Coagulation: No results found for this basename: LABPROT, INR,  in the last 72 hours Urine Drug Screen: Drugs of Abuse  No results found for this basename: labopia, cocainscrnur, labbenz, amphetmu, thcu, labbarb    Alcohol Level: No results found for this basename: ETH,  in the last 72 hours Urinalysis: No results found for this basename: COLORURINE, APPERANCEUR, LABSPEC, PHURINE, GLUCOSEU, HGBUR, BILIRUBINUR, KETONESUR, PROTEINUR, UROBILINOGEN, NITRITE, LEUKOCYTESUR,  in the last 72 hours Misc. Labs:  ABGS No results found for this basename: PHART, PCO2, PO2ART, TCO2, HCO3,  in the last 72 hours CULTURES No results found for this or any previous visit (from the past 240 hour(s)). Studies/Results: Dg Chest 2 View  12/02/2012  *RADIOLOGY REPORT*  Clinical Data: Pneumonia.  CHEST - 2 VIEW  Comparison: Chest x-ray 11/22/2012.  Findings: Lungs appear hyperexpanded with flattening of the hemidiaphragms, increased retrosternal air space and pruning of the pulmonary vasculature in the periphery, suggestive of underlying COPD.  Mild diffuse peribronchial cuffing.  Heart is mildly enlarged. The patient is rotated to the right on today's exam, resulting in distortion of the mediastinal contours and reduced diagnostic sensitivity and specificity for mediastinal pathology.  Atherosclerosis in the thoracic aorta.  IMPRESSION: 1.  No radiographic evidence of acute cardiopulmonary disease. 2.  Chronic changes suggestive of COPD, as above. 3.  Mild cardiomegaly. 4.  Atherosclerosis.   Original Report Authenticated By: Trudie Reed, M.D.     Medications:  Prior to Admission:  Prescriptions prior to admission  Medication Sig Dispense Refill  . aspirin EC 81 MG tablet Take 81 mg  by mouth daily.      . beta carotene w/minerals (OCUVITE) tablet Take 1 tablet by mouth daily.      . Cholecalciferol (VITAMIN D PO) Take 500 mg by mouth 2 (two) times daily.      . diazepam (VALIUM) 5 MG tablet Take 2.5 mg by mouth every 8 (eight) hours as needed for anxiety.      . hydrALAZINE (APRESOLINE) 10 MG tablet Take 10 mg by mouth 2 (two) times daily.      . hydrochlorothiazide (HYDRODIURIL) 25 MG tablet Take 12.5 mg by mouth daily.      . Multiple Vitamin (MULTIVITAMIN) tablet Take 1 tablet by mouth daily.      . Nebivolol HCl (BYSTOLIC) 20 MG TABS Take 20 mg by mouth 2 (two) times daily.      . Omega-3 Fatty Acids (FISH OIL PO) Take 2,000 mg by mouth daily.      . potassium chloride (K-DUR) 10 MEQ tablet Take 10 mEq by mouth 2 (two) times daily.      Marland Kitchen telmisartan (MICARDIS) 80 MG tablet Take 80 mg by mouth daily.      . timolol (TIMOPTIC) 0.5 % ophthalmic solution Place 2 drops into the left eye 2 (two) times daily.       Scheduled: . albuterol  2.5 mg Nebulization Q6H  . azithromycin  500 mg Intravenous Q24H  . cefTRIAXone (ROCEPHIN)  IV  1 g Intravenous Q24H  . enoxaparin (LOVENOX) injection  40 mg Subcutaneous Q24H  . guaiFENesin  600 mg Oral BID  . hydrALAZINE  10 mg Oral BID  . irbesartan  300 mg Oral Daily  . nebivolol  20 mg Oral BID  . pantoprazole  40 mg Oral Q1200  . timolol  2 drop Left Eye BID   Continuous: . sodium chloride 75 mL/hr at 12/03/12 0559   ZOX:[id number], acetaminophen, alum & mag hydroxide-simeth, ondansetron (ZOFRAN) IV, ondansetron  Assesment: She was admitted with clinical pneumonia. Chest x-ray looks okay. She is still mildly hyponatremic. She has been confused at home. She has been very weak and having severe coughing spells. She has been unable to attend to her activities of daily living. PT evaluation showed that she did very well. I do not think that her family feels that she's going to be able to be managed at home at least immediately  because her husband has been her primary caregiver and he is 77 years old with health problems Active Problems:   * No active hospital problems. *    Plan: Continue current treatments social service consult is underway    LOS: 1 day   Madigan Rosensteel L 12/03/2012, 8:22 AM

## 2012-12-03 NOTE — Progress Notes (Signed)
INITIAL NUTRITION ASSESSMENT  DOCUMENTATION CODES Per approved criteria  -Not Applicable   INTERVENTION:  Ensure Complete po q HS, each supplement provides 350 kcal and 13 grams of protein.  RD will follow for nutrition needs  NUTRITION DIAGNOSIS: Unplanned weight loss related to PNA/FTT as evidenced by decline in ADL's, confusion, congestion, atelectasis, wt loss   Goal: Pt to meet >/= 90% of their estimated nutrition needs  Monitor:  Po intake, labs and wt trends  Reason for Assessment: Malnurtriton Screen  79 y.o. female  Admitting Dx: PNA/FTT  ASSESSMENT: Pt very pleasant lady. She reports appetite and po intake much better this morning (75% of breakfast consumed) and feels that she can eat a good lunch also. She says, wt loss recently due to "my little illness". Weight hx reflects wt loss of 9%,12# <6 months. Trending toward significance.  She does not meet criteria for malnutrition at this time but is certainly at risk given her acute illness and FTT. Will continue to follow for nutrition needs and re-assess as needed.   Height: Ht Readings from Last 1 Encounters:  12/02/12 5\' 4"  (1.626 m)    Weight: Wt Readings from Last 1 Encounters:  12/02/12 123 lb (55.792 kg)    Ideal Body Weight: 120# (54.5 kg)  % Ideal Body Weight: 103%  Wt Readings from Last 10 Encounters:  12/02/12 123 lb (55.792 kg)  09/18/12 126 lb (57.153 kg)  07/22/12 135 lb 5.8 oz (61.4 kg)  10/06/11 135 lb (61.236 kg)    Usual Body Weight: 135# (61 kg)  % Usual Body Weight: 91%  BMI:  Body mass index is 21.1 kg/(m^2).Normal range  Estimated Nutritional Needs: Kcal: 1400-1600 Protein: 60-70 gr Fluid: >1600 ml/day  Skin: No issues noted  Diet Order: General  EDUCATION NEEDS: -No education needs identified at this time   Intake/Output Summary (Last 24 hours) at 12/03/12 1223 Last data filed at 12/03/12 0645  Gross per 24 hour  Intake   1680 ml  Output      0 ml  Net   1680  ml    Last BM: 12/02/12   Labs:   Recent Labs Lab 12/02/12 1615  NA 128*  K 4.1  CL 93*  CO2 30  BUN 19  CREATININE 0.76  CALCIUM 8.8  GLUCOSE 88    CBG (last 3)  No results found for this basename: GLUCAP,  in the last 72 hours  Scheduled Meds: . albuterol  2.5 mg Nebulization Q6H  . azithromycin  500 mg Intravenous Q24H  . cefTRIAXone (ROCEPHIN)  IV  1 g Intravenous Q24H  . enoxaparin (LOVENOX) injection  40 mg Subcutaneous Q24H  . guaiFENesin  600 mg Oral BID  . hydrALAZINE  10 mg Oral BID  . irbesartan  300 mg Oral Daily  . nebivolol  20 mg Oral BID  . pantoprazole  40 mg Oral Q1200  . timolol  2 drop Left Eye BID  . tuberculin  5 Units Intradermal Once    Continuous Infusions: . sodium chloride 75 mL/hr at 12/03/12 0559    Past Medical History  Diagnosis Date  . Infections of kidney   . Hypertension   . Osteoporosis   . Eye problem   . Anxiety     Past Surgical History  Procedure Laterality Date  . Eye surgery    . Tonsillectomy    . Thyroid surgery    . Dilation and curettage of uterus    . Brain shunt    .  Hernia repair    . Cataract extraction    . Foot surgery      Royann Shivers MS,RD,LDN,CSG Office: #[phone] Pager: [phone]

## 2012-12-03 NOTE — Clinical Social Work Placement (Signed)
Clinical Social Work Department CLINICAL SOCIAL WORK PLACEMENT NOTE 12/03/2012  Patient:  CHANLER, SCHREITER  Account Number:  [id number] Admit date:  12/02/2012  Clinical Social Worker:  Derenda Fennel, LCSW  Date/time:  12/03/2012 12:27 PM  Clinical Social Work is seeking post-discharge placement for this patient at the following level of care:   ASSISTED LIVING/REST HOME   (*CSW will update this form in Epic as items are completed)   12/03/2012  Patient/family provided with [facility] Department of Clinical Social Work's list of facilities offering this level of care within the geographic area requested by the patient (or if unable, by the patient's family).  12/03/2012  Patient/family informed of their freedom to choose among providers that offer the needed level of care, that participate in Medicare, Medicaid or managed care program needed by the patient, have an available bed and are willing to accept the patient.  12/03/2012  Patient/family informed of [facility]' ownership interest in [facility], as well as of the fact that they are under no obligation to receive care at this facility.  PASARR submitted to EDS on  PASARR number received from EDS on   FL2 transmitted to all facilities in geographic area requested by pt/family on  12/03/2012 FL2 transmitted to all facilities within larger geographic area on   Patient informed that his/her managed care company has contracts with or will negotiate with  certain facilities, including the following:     Patient/family informed of bed offers received:  12/03/2012 Patient chooses bed at [facility] [place] Physician recommends and patient chooses bed at  [facility] [place]  Patient to be transferred to  on   Patient to be transferred to facility by   The following physician request were entered in Epic:   Additional Comments:  Derenda Fennel, LCSW [phone]

## 2012-12-03 NOTE — Clinical Social Work Psychosocial (Signed)
Clinical Social Work Department BRIEF PSYCHOSOCIAL ASSESSMENT 12/03/2012  Patient:  Felicia Wells, Felicia Wells     Account Number:  [id number]     Admit date:  12/02/2012  Clinical Social Worker:  Nancie Neas  Date/Time:  12/03/2012 12:15 PM  Referred by:  Physician  Date Referred:  12/03/2012 Referred for  SNF Placement   Other Referral:   Interview type:  Patient Other interview type:   and daughter- [name]    PSYCHOSOCIAL DATA Living Status:  HUSBAND Admitted from facility:   Level of care:   Primary support name:  [name] Primary support relationship to patient:  CHILD, ADULT Degree of support available:   supportive per pt    CURRENT CONCERNS Current Concerns  Post-Acute Placement   Other Concerns:    SOCIAL WORK ASSESSMENT / PLAN CSW met with pt and pt's daughter at bedside. Pt alert and oriented and known to CSW from previous admission last fall. Pt was placed at [facility] before Thanksgiving and d/c home on Christmas Eve. Pt lives with her 29 year old husband. Their son lives nearby and their daughter lives in [place]. Family very involved and supportive. Pt indicates since she left [facility] she has been doing "fine" but she has episodes where she has no energy. Pt and family were hoping for pt to go to SNF for rehab. Pt evaluated by PT and ambulated 225'. CSW explained that pt would not qualify for SNF but would be appropriate for ALF. Family aware this is private pay. Family had already started talking to [facility] and this is their preference. [employer] assessed pt today and offered bed. Family signing paperwork now. CSW requested TB skin test and facility aware will need to be read Sunday. Per MD, pt should be ready to go to ALF tomorrow.   Assessment/plan status:  Psychosocial Support/Ongoing Assessment of Needs Other assessment/ plan:   Information/referral to community resources:   ALF list    PATIENT'S/FAMILY'S RESPONSE TO PLAN OF CARE: Pt and family requesting  for pt to transfer to [facility] when stable. Facility able to accept pt either day this weekend. MD notified.        Derenda Fennel, [place] [phone]

## 2012-12-04 DIAGNOSIS — J189 Pneumonia, unspecified organism: Secondary | ICD-10-CM | POA: Diagnosis present

## 2012-12-04 DIAGNOSIS — R29898 Other symptoms and signs involving the musculoskeletal system: Secondary | ICD-10-CM | POA: Diagnosis present

## 2012-12-04 DIAGNOSIS — E871 Hypo-osmolality and hyponatremia: Secondary | ICD-10-CM | POA: Diagnosis present

## 2012-12-04 DIAGNOSIS — R410 Disorientation, unspecified: Secondary | ICD-10-CM | POA: Diagnosis present

## 2012-12-04 LAB — URINE CULTURE: Colony Count: 9000

## 2012-12-04 LAB — CBC WITH DIFFERENTIAL/PLATELET
Basophils Absolute: 0.1 10*3/uL (ref 0.0–0.1)
Basophils Relative: 1 % (ref 0–1)
Lymphocytes Relative: 21 % (ref 12–46)
MCHC: 33.3 g/dL (ref 30.0–36.0)
Neutro Abs: 4.3 10*3/uL (ref 1.7–7.7)
Neutrophils Relative %: 67 % (ref 43–77)
Platelets: 238 10*3/uL (ref 150–400)
RDW: 13.9 % (ref 11.5–15.5)
WBC: 6.4 10*3/uL (ref 4.0–10.5)

## 2012-12-04 LAB — BASIC METABOLIC PANEL
Calcium: 8.9 mg/dL (ref 8.4–10.5)
Chloride: 102 mEq/L (ref 96–112)
Creatinine, Ser: 0.57 mg/dL (ref 0.50–1.10)
GFR calc Af Amer: >90 mL/min (ref >90)
GFR calc non Af Amer: 79 mL/min — ABNORMAL LOW (ref >90)

## 2012-12-04 MED ORDER — CEPHALEXIN 500 MG PO CAPS: 500.0000 mg | ORAL_CAPSULE | Freq: Four times a day (QID) | ORAL | Status: DC

## 2012-12-04 MED ORDER — AZITHROMYCIN 250 MG PO TABS
500.0000 mg | ORAL_TABLET | Freq: Every day | ORAL | Status: DC
  Administered 2012-12-04: 500 mg via ORAL
  Filled 2012-12-04: qty 2

## 2012-12-04 MED ORDER — POTASSIUM CHLORIDE ER 10 MEQ PO TBCR: 10.0000 meq | EXTENDED_RELEASE_TABLET | Freq: Every day | ORAL | Status: DC

## 2012-12-04 NOTE — Discharge Summary (Signed)
Physician Discharge Summary  Patient ID: Felicia Wells MRN: [id number] DOB/AGE: [id number] 77 y.o. Primary Care Physician:Theola Cuellar L, MD Admit date: 12/02/2012 Discharge date: 12/04/2012    Discharge Diagnoses:   Principal Problem:   CAP (community acquired pneumonia) Active Problems:   Difficulty in walking   Hypertension   Meniere's disease   Hyponatremia   Muscular deconditioning   Acute delirium     Medication List    TAKE these medications       aspirin EC 81 MG tablet  Take 81 mg by mouth daily.     beta carotene w/minerals tablet  Take 1 tablet by mouth daily.     BYSTOLIC 20 MG Tabs  Generic drug:  Nebivolol HCl  Take 20 mg by mouth 2 (two) times daily.     cephALEXin 500 MG capsule  Commonly known as:  KEFLEX  Take 1 capsule (500 mg total) by mouth 4 (four) times daily.     diazepam 5 MG tablet  Commonly known as:  VALIUM  Take 2.5 mg by mouth every 8 (eight) hours as needed for anxiety.     FISH OIL PO  Take 2,000 mg by mouth daily.     hydrALAZINE 10 MG tablet  Commonly known as:  APRESOLINE  Take 10 mg by mouth 2 (two) times daily.     hydrochlorothiazide 25 MG tablet  Commonly known as:  HYDRODIURIL  Take 12.5 mg by mouth daily.     multivitamin tablet  Take 1 tablet by mouth daily.     potassium chloride 10 MEQ tablet  Commonly known as:  K-DUR  Take 1 tablet (10 mEq total) by mouth daily.     telmisartan 80 MG tablet  Commonly known as:  MICARDIS  Take 80 mg by mouth daily.     timolol 0.5 % ophthalmic solution  Commonly known as:  TIMOPTIC  Place 2 drops into the left eye 2 (two) times daily.     VITAMIN D PO  Take 500 mg by mouth 2 (two) times daily.        Discharged Condition: Improved    Consults: None  Significant Diagnostic Studies: Dg Chest 2 View  12/02/2012  *RADIOLOGY REPORT*  Clinical Data: Pneumonia.  CHEST - 2 VIEW  Comparison: Chest x-ray 11/22/2012.  Findings: Lungs appear hyperexpanded with  flattening of the hemidiaphragms, increased retrosternal air space and pruning of the pulmonary vasculature in the periphery, suggestive of underlying COPD.  Mild diffuse peribronchial cuffing.  Heart is mildly enlarged. The patient is rotated to the right on today's exam, resulting in distortion of the mediastinal contours and reduced diagnostic sensitivity and specificity for mediastinal pathology. Atherosclerosis in the thoracic aorta.  IMPRESSION: 1.  No radiographic evidence of acute cardiopulmonary disease. 2.  Chronic changes suggestive of COPD, as above. 3.  Mild cardiomegaly. 4.  Atherosclerosis.   Original Report Authenticated By: Trudie Reed, M.D.    Dg Chest 2 View  11/22/2012  *RADIOLOGY REPORT*  Clinical Data: Bronchitis with cough and congestion.  CHEST - 2 VIEW  Comparison: 09/18/2012  Findings: Two views of the chest demonstrate chronic hyperinflation.  New densities at the right costophrenic angle could represent atelectasis or small focus of airspace disease. Otherwise, the lungs are clear.  Stable appearance of the heart and mediastinum.  IMPRESSION: New densities at the right costophrenic angle could represent atelectasis or infection.   Original Report Authenticated By: Richarda Overlie, M.D.     Lab Results: Basic Metabolic Panel:  Recent Labs  12/02/12 1615 12/04/12 0606  NA 128* 135  K 4.1 3.7  CL 93* 102  CO2 30 27  GLUCOSE 88 89  BUN 19 10  CREATININE 0.76 0.57  CALCIUM 8.8 8.9   Liver Function Tests:  Recent Labs  12/02/12 1615  AST 18  ALT 14  ALKPHOS 65  BILITOT 0.6  PROT 6.0  ALBUMIN 3.1*     CBC:  Recent Labs  12/02/12 1615 12/04/12 0606  WBC 9.2 6.4  NEUTROABS 6.3 4.3  HGB 13.6 12.6  HCT 40.6 37.8  MCV 95.3 95.2  PLT 259 238    Recent Results (from the past 240 hour(s))  URINE CULTURE     Status: None   Collection Time    12/02/12 11:50 PM      Result Value Range Status   Specimen Description URINE, CLEAN CATCH   Final   Special  Requests NONE   Final   Culture  Setup Time 12/03/2012 00:00   Final   Colony Count 9,000 COLONIES/ML   Final   Culture INSIGNIFICANT GROWTH   Final   Report Status 12/04/2012 FINAL   Final     Hospital Course: She was admitted from my office. She had a severe chest infection and was not improving at home. She was coughing incessantly she was very weak and she was somewhat confused and this was felt to be a delirium from her infection. She was not eating well not drinking fluids well. She was started on IV fluids had chest x-ray that did not show pneumonia but clinically she had pneumonia and was treated as such. Her sodium level was down and she was given IV fluids for replacement. She was weak but did well with physical therapy. She was markedly improved at the time of discharge and ready for to go to an assisted living facility  Discharge Exam: Blood pressure 145/82, pulse 74, temperature 97.9 F (36.6 C), temperature source Oral, resp. rate 20, height 5\' 4"  (1.626 m), weight 56.654 kg (124 lb 14.4 oz), SpO2 95.00%. She is awake and alert. Her chest is clear. Her heart is regular. She looks much better.  Disposition: To assisted living facility. She will have no added salt diet. Medications will be as listed above. She can obtain physical therapy at the assisted living facility and I would like for that to take place      Discharge Orders   Future Orders Complete By Expires     Discharge patient  As directed          Signed: Bianna Haran L Pager [phone]  12/04/2012, 10:02 AM

## 2012-12-04 NOTE — Progress Notes (Signed)
Subjective: She feels better. She has no new complaints.  Objective: Vital signs in last 24 hours: Temp:  [97.8 F (36.6 C)-97.9 F (36.6 C)] 97.9 F (36.6 C) (04/05 0647) Pulse Rate:  [60-74] 74 (04/05 0647) Resp:  [16-20] 20 (04/05 0647) BP: (107-156)/(55-84) 145/82 mmHg (04/05 0647) SpO2:  [93 %-96 %] 95 % (04/05 0728) Weight:  [56.654 kg (124 lb 14.4 oz)] 56.654 kg (124 lb 14.4 oz) (04/05 0500) Weight change: 0.862 kg (1 lb 14.4 oz) Last BM Date: 12/02/12  Intake/Output from previous day: 04/04 0701 - 04/05 0700 In: 2583.8 [P.O.:840; I.V.:1743.8] Out: 1000 [Urine:1000]  PHYSICAL EXAM General appearance: alert, cooperative and no distress Resp: clear to auscultation bilaterally Cardio: regular rate and rhythm, S1, S2 normal, no murmur, click, rub or gallop GI: soft, non-tender; bowel sounds normal; no masses,  no organomegaly Extremities: extremities normal, atraumatic, no cyanosis or edema  Lab Results:    Basic Metabolic Panel:  Recent Labs  16/10/96 1615 12/04/12 0606  NA 128* 135  K 4.1 3.7  CL 93* 102  CO2 30 27  GLUCOSE 88 89  BUN 19 10  CREATININE 0.76 0.57  CALCIUM 8.8 8.9   Liver Function Tests:  Recent Labs  12/02/12 1615  AST 18  ALT 14  ALKPHOS 65  BILITOT 0.6  PROT 6.0  ALBUMIN 3.1*   No results found for this basename: LIPASE, AMYLASE,  in the last 72 hours No results found for this basename: AMMONIA,  in the last 72 hours CBC:  Recent Labs  12/02/12 1615 12/04/12 0606  WBC 9.2 6.4  NEUTROABS 6.3 4.3  HGB 13.6 12.6  HCT 40.6 37.8  MCV 95.3 95.2  PLT 259 238   Cardiac Enzymes: No results found for this basename: CKTOTAL, CKMB, CKMBINDEX, TROPONINI,  in the last 72 hours BNP: No results found for this basename: PROBNP,  in the last 72 hours D-Dimer: No results found for this basename: DDIMER,  in the last 72 hours CBG: No results found for this basename: GLUCAP,  in the last 72 hours Hemoglobin A1C: No results found  for this basename: HGBA1C,  in the last 72 hours Fasting Lipid Panel: No results found for this basename: CHOL, HDL, LDLCALC, TRIG, CHOLHDL, LDLDIRECT,  in the last 72 hours Thyroid Function Tests:  Recent Labs  12/02/12 1615  TSH 3.517   Anemia Panel: No results found for this basename: VITAMINB12, FOLATE, FERRITIN, TIBC, IRON, RETICCTPCT,  in the last 72 hours Coagulation: No results found for this basename: LABPROT, INR,  in the last 72 hours Urine Drug Screen: Drugs of Abuse  No results found for this basename: labopia, cocainscrnur, labbenz, amphetmu, thcu, labbarb    Alcohol Level: No results found for this basename: ETH,  in the last 72 hours Urinalysis: No results found for this basename: COLORURINE, APPERANCEUR, LABSPEC, PHURINE, GLUCOSEU, HGBUR, BILIRUBINUR, KETONESUR, PROTEINUR, UROBILINOGEN, NITRITE, LEUKOCYTESUR,  in the last 72 hours Misc. Labs:  ABGS No results found for this basename: PHART, PCO2, PO2ART, TCO2, HCO3,  in the last 72 hours CULTURES Recent Results (from the past 240 hour(s))  URINE CULTURE     Status: None   Collection Time    12/02/12 11:50 PM      Result Value Range Status   Specimen Description URINE, CLEAN CATCH   Final   Special Requests NONE   Final   Culture  Setup Time 12/03/2012 00:00   Final   Colony Count 9,000 COLONIES/ML   Final  Culture INSIGNIFICANT GROWTH   Final   Report Status 12/04/2012 FINAL   Final   Studies/Results: Dg Chest 2 View  12/02/2012  *RADIOLOGY REPORT*  Clinical Data: Pneumonia.  CHEST - 2 VIEW  Comparison: Chest x-ray 11/22/2012.  Findings: Lungs appear hyperexpanded with flattening of the hemidiaphragms, increased retrosternal air space and pruning of the pulmonary vasculature in the periphery, suggestive of underlying COPD.  Mild diffuse peribronchial cuffing.  Heart is mildly enlarged. The patient is rotated to the right on today's exam, resulting in distortion of the mediastinal contours and reduced  diagnostic sensitivity and specificity for mediastinal pathology. Atherosclerosis in the thoracic aorta.  IMPRESSION: 1.  No radiographic evidence of acute cardiopulmonary disease. 2.  Chronic changes suggestive of COPD, as above. 3.  Mild cardiomegaly. 4.  Atherosclerosis.   Original Report Authenticated By: Trudie Reed, M.D.     Medications:  Prior to Admission:  Prescriptions prior to admission  Medication Sig Dispense Refill  . aspirin EC 81 MG tablet Take 81 mg by mouth daily.      . beta carotene w/minerals (OCUVITE) tablet Take 1 tablet by mouth daily.      . Cholecalciferol (VITAMIN D PO) Take 500 mg by mouth 2 (two) times daily.      . diazepam (VALIUM) 5 MG tablet Take 2.5 mg by mouth every 8 (eight) hours as needed for anxiety.      . hydrALAZINE (APRESOLINE) 10 MG tablet Take 10 mg by mouth 2 (two) times daily.      . hydrochlorothiazide (HYDRODIURIL) 25 MG tablet Take 12.5 mg by mouth daily.      . Multiple Vitamin (MULTIVITAMIN) tablet Take 1 tablet by mouth daily.      . Nebivolol HCl (BYSTOLIC) 20 MG TABS Take 20 mg by mouth 2 (two) times daily.      . Omega-3 Fatty Acids (FISH OIL PO) Take 2,000 mg by mouth daily.      . potassium chloride (K-DUR) 10 MEQ tablet Take 10 mEq by mouth 2 (two) times daily.      Marland Kitchen telmisartan (MICARDIS) 80 MG tablet Take 80 mg by mouth daily.      . timolol (TIMOPTIC) 0.5 % ophthalmic solution Place 2 drops into the left eye 2 (two) times daily.       Scheduled: . albuterol  2.5 mg Nebulization Q6H  . azithromycin  500 mg Oral Daily  . cefTRIAXone (ROCEPHIN)  IV  1 g Intravenous Q24H  . enoxaparin (LOVENOX) injection  40 mg Subcutaneous Q24H  . feeding supplement  237 mL Oral Q2000  . guaiFENesin  600 mg Oral BID  . hydrALAZINE  10 mg Oral BID  . irbesartan  300 mg Oral Daily  . nebivolol  20 mg Oral BID  . pantoprazole  40 mg Oral Q1200  . timolol  2 drop Left Eye BID  . tuberculin  5 Units Intradermal Once   Continuous: . sodium  chloride 75 mL/hr at 12/04/12 0712   WUJ:[id number], acetaminophen, alum & mag hydroxide-simeth, ondansetron (ZOFRAN) IV, ondansetron  Assesment: She was admitted with what appears to be clinical pneumonia. This did not show on her chest x-ray on admission but she did have atelectasis on chest x-ray about a week ago. She continues to have some cough and congestion. She has improved with treatment for presumed pneumonia. Her sodium level was low and it is better. She has hypertension which well controlled. She has Mnire's disease which is not a problem right Active  Problems:   * No active hospital problems. *    Plan: Discharge to assisted living facility today    LOS: 2 days   Leoda Smithhart L 12/04/2012, 9:46 AM

## 2012-12-04 NOTE — Progress Notes (Signed)
Saline lock removed. Pt a/o.vss. Up with assistance. Discharge instructions given. Prescriptions given and discussed with pt and pt daughter. Pt and pt daughter verbalized understanding of instructions. Pt to be discharged to [facility].

## 2012-12-04 NOTE — Progress Notes (Signed)
PHARMACIST - PHYSICIAN COMMUNICATION DR:   Juanetta Gosling CONCERNING: Antibiotic IV to Oral Route Change Policy  RECOMMENDATION: This patient is receiving Zithromax by the intravenous route.  Based on criteria approved by the Pharmacy and Therapeutics Committee, the antibiotic(s) is/are being converted to the equivalent oral dose form(s).   DESCRIPTION: These criteria include:  Patient being treated for a respiratory tract infection, urinary tract infection, or cellulitis  The patient is not neutropenic and does not exhibit a GI malabsorption state  The patient is eating (either orally or via tube) and/or has been taking other orally administered medications for a least 24 hours  The patient is improving clinically and has a Tmax < 100.5  If you have questions about this conversion, please contact the Pharmacy Department  [x]   [phone] )  [facility] []   [phone] )  [facility]  []   [phone] )  [facility] []   [phone] )  [facility]   Mady Gemma, [facility] 12/04/2012 9:42 AM

## 2013-03-29 ENCOUNTER — Ambulatory Visit: Payer: Medicare PPO | Admitting: Urology

## 2013-07-14 ENCOUNTER — Emergency Department (HOSPITAL_COMMUNITY)
Admission: EM | Admit: 2013-07-14 | Discharge: 2013-07-14 | Disposition: A | Discharge status: Home / Self Care | Payer: Medicare PPO | Attending: Emergency Medicine | Admitting: Emergency Medicine

## 2013-07-14 ENCOUNTER — Encounter (HOSPITAL_COMMUNITY): Payer: Self-pay | Admitting: Emergency Medicine

## 2013-07-14 ENCOUNTER — Emergency Department (HOSPITAL_COMMUNITY): Payer: Medicare PPO

## 2013-07-14 DIAGNOSIS — Z8669 Personal history of other diseases of the nervous system and sense organs: Secondary | ICD-10-CM | POA: Insufficient documentation

## 2013-07-14 DIAGNOSIS — N390 Urinary tract infection, site not specified: Secondary | ICD-10-CM

## 2013-07-14 DIAGNOSIS — Z792 Long term (current) use of antibiotics: Secondary | ICD-10-CM | POA: Insufficient documentation

## 2013-07-14 DIAGNOSIS — Z8659 Personal history of other mental and behavioral disorders: Secondary | ICD-10-CM | POA: Insufficient documentation

## 2013-07-14 DIAGNOSIS — Z79899 Other long term (current) drug therapy: Secondary | ICD-10-CM | POA: Insufficient documentation

## 2013-07-14 DIAGNOSIS — M810 Age-related osteoporosis without current pathological fracture: Secondary | ICD-10-CM | POA: Insufficient documentation

## 2013-07-14 DIAGNOSIS — I10 Essential (primary) hypertension: Secondary | ICD-10-CM | POA: Insufficient documentation

## 2013-07-14 DIAGNOSIS — Z7982 Long term (current) use of aspirin: Secondary | ICD-10-CM | POA: Insufficient documentation

## 2013-07-14 LAB — BASIC METABOLIC PANEL
CO2: 31 mEq/L (ref 19–32)
Chloride: 94 mEq/L — ABNORMAL LOW (ref 96–112)
Creatinine, Ser: 0.81 mg/dL (ref 0.50–1.10)
GFR calc Af Amer: 71 mL/min — ABNORMAL LOW (ref >90)
Potassium: 3.8 mEq/L (ref 3.5–5.1)

## 2013-07-14 LAB — URINALYSIS, ROUTINE W REFLEX MICROSCOPIC
Bilirubin Urine: NEGATIVE
Nitrite: NEGATIVE
Specific Gravity, Urine: 1.025 (ref 1.005–1.030)
Urobilinogen, UA: 0.2 mg/dL (ref 0.0–1.0)
pH: 5.5 (ref 5.0–8.0)

## 2013-07-14 LAB — CBC WITH DIFFERENTIAL/PLATELET
Basophils Absolute: 0.1 10*3/uL (ref 0.0–0.1)
Basophils Relative: 1 % (ref 0–1)
HCT: 42.7 % (ref 36.0–46.0)
Hemoglobin: 14.1 g/dL (ref 12.0–15.0)
Lymphocytes Relative: 21 % (ref 12–46)
Monocytes Absolute: 1.1 10*3/uL — ABNORMAL HIGH (ref 0.1–1.0)
Monocytes Relative: 10 % (ref 3–12)
Neutro Abs: 7.2 10*3/uL (ref 1.7–7.7)
Neutrophils Relative %: 67 % (ref 43–77)
RDW: 14.4 % (ref 11.5–15.5)
WBC: 10.8 10*3/uL — ABNORMAL HIGH (ref 4.0–10.5)

## 2013-07-14 LAB — TROPONIN I: Troponin I: <0.3 ng/mL (ref <0.30)

## 2013-07-14 LAB — URINE MICROSCOPIC-ADD ON

## 2013-07-14 MED ORDER — NITROFURANTOIN MONOHYD MACRO 100 MG PO CAPS: 100.0000 mg | ORAL_CAPSULE | Freq: Two times a day (BID) | ORAL | Status: DC

## 2013-07-14 MED ORDER — NITROFURANTOIN MONOHYD MACRO 100 MG PO CAPS
100.0000 mg | ORAL_CAPSULE | Freq: Once | ORAL | Status: AC
  Administered 2013-07-14: 100 mg via ORAL
  Filled 2013-07-14: qty 1

## 2013-07-14 NOTE — ED Notes (Signed)
Pt states generalized weakness came on suddenly at 1515. Denies dizziness, nausea or any other symptoms at the time. Pt states she is feeling a little better now. Pt states she thinks she may have trouble if she attempts to get up and walk. Pt is a resident of [address]

## 2013-07-14 NOTE — ED Provider Notes (Signed)
CSN: [id number]     Arrival date & time 07/14/13  1607 History   First MD Initiated Contact with Patient 07/14/13 1613     Chief Complaint  Patient presents with  . Fatigue   HPI  Pt presents with EMS from [facility].  Walking with husband today and felt "Warm and Weak". Not syncopal or near syncopal.  Walked back to room on her own with husband.  EMS called.  Otherwise feels well, and denies symptoms here. No recent illness or change in meds.  Past Medical History  Diagnosis Date  . Infections of kidney   . Hypertension   . Osteoporosis   . Eye problem   . Anxiety    Past Surgical History  Procedure Laterality Date  . Eye surgery    . Tonsillectomy    . Thyroid surgery    . Dilation and curettage of uterus    . Brain shunt    . Hernia repair    . Cataract extraction    . Foot surgery     History reviewed. No pertinent family history. History  Substance Use Topics  . Smoking status: Never Smoker   . Smokeless tobacco: Never Used  . Alcohol Use: No   OB History   Grav Para Term Preterm Abortions TAB SAB Ect Mult Living                 Review of Systems  Constitutional: Positive for fatigue. Negative for fever, chills, diaphoresis and appetite change.  HENT: Negative for mouth sores, sore throat and trouble swallowing.   Eyes: Negative for visual disturbance.  Respiratory: Negative for cough, chest tightness, shortness of breath and wheezing.   Cardiovascular: Negative for chest pain.  Gastrointestinal: Negative for nausea, vomiting, abdominal pain, diarrhea and abdominal distention.  Endocrine: Negative for polydipsia, polyphagia and polyuria.  Genitourinary: Negative for dysuria, frequency and hematuria.  Musculoskeletal: Negative for gait problem.  Skin: Negative for color change, pallor and rash.  Neurological: Positive for weakness. Negative for dizziness, syncope, light-headedness and headaches.  Hematological: Does not bruise/bleed easily.   Psychiatric/Behavioral: Negative for behavioral problems and confusion.    Allergies  Codeine; Darvocet; Demerol; Other; and Sulfa antibiotics  Home Medications   Current Outpatient Rx  Name  Route  Sig  Dispense  Refill  . aspirin EC 81 MG tablet   Oral   Take 81 mg by mouth daily.         . beta carotene w/minerals (OCUVITE) tablet   Oral   Take 1 tablet by mouth daily.         . Cholecalciferol (VITAMIN D PO)   Oral   Take 500 mg by mouth 2 (two) times daily.         . hydrALAZINE (APRESOLINE) 10 MG tablet   Oral   Take 10 mg by mouth 2 (two) times daily.         . hydrochlorothiazide (HYDRODIURIL) 25 MG tablet   Oral   Take 25 mg by mouth daily.         . Multiple Vitamin (MULTIVITAMIN) tablet   Oral   Take 1 tablet by mouth daily.         . Nebivolol HCl (BYSTOLIC) 20 MG TABS   Oral   Take 20 mg by mouth 2 (two) times daily.         . Omega-3 Fatty Acids (FISH OIL PO)   Oral   Take 2,000 mg by mouth daily.         Marland Kitchen  potassium chloride (K-DUR) 10 MEQ tablet   Oral   Take 1 tablet (10 mEq total) by mouth daily.   30 tablet   12   . telmisartan (MICARDIS) 80 MG tablet   Oral   Take 80 mg by mouth daily.         . timolol (TIMOPTIC) 0.5 % ophthalmic solution   Left Eye   Place 2 drops into the left eye 2 (two) times daily.         . nitrofurantoin, macrocrystal-monohydrate, (MACROBID) 100 MG capsule   Oral   Take 1 capsule (100 mg total) by mouth 2 (two) times daily.   10 capsule   0    BP 127/77  Pulse 65  Temp(Src) 98.5 F (36.9 C) (Oral)  Resp 18  Ht 5\' 5"  (1.651 m)  Wt 125 lb (56.7 kg)  BMI 20.80 kg/m2  SpO2 95% Physical Exam  Constitutional: She is oriented to person, place, and time. She appears well-developed and well-nourished. No distress.  HENT:  Head: Normocephalic.  Eyes: Conjunctivae are normal. Pupils are equal, round, and reactive to light. No scleral icterus.  Neck: Normal range of motion. Neck supple.  No thyromegaly present.  Cardiovascular: Normal rate and regular rhythm.  Exam reveals no gallop and no friction rub.   No murmur heard. Pulmonary/Chest: Effort normal and breath sounds normal. No respiratory distress. She has no wheezes. She has no rales.  Abdominal: Soft. Bowel sounds are normal. She exhibits no distension. There is no tenderness. There is no rebound.  Musculoskeletal: Normal range of motion.  Neurological: She is alert and oriented to person, place, and time.  Poor memory.  Skin: Skin is warm and dry. No rash noted.  Psychiatric: She has a normal mood and affect. Her behavior is normal.    ED Course  Procedures (including critical care time) Labs Review Labs Reviewed  CBC WITH DIFFERENTIAL - Abnormal; Notable for the following:    WBC 10.8 (*)    Monocytes Absolute 1.1 (*)    All other components within normal limits  BASIC METABOLIC PANEL - Abnormal; Notable for the following:    Sodium 134 (*)    Chloride 94 (*)    GFR calc non Af Amer 61 (*)    GFR calc Af Amer 71 (*)    All other components within normal limits  URINALYSIS, ROUTINE W REFLEX MICROSCOPIC - Abnormal; Notable for the following:    Leukocytes, UA MODERATE (*)    All other components within normal limits  URINE MICROSCOPIC-ADD ON - Abnormal; Notable for the following:    Squamous Epithelial / LPF FEW (*)    Bacteria, UA FEW (*)    All other components within normal limits  URINE CULTURE  TROPONIN I   Imaging Review Dg Chest Port 1 View  07/14/2013   CLINICAL DATA:  Fatigue.  EXAM: PORTABLE CHEST - 1 VIEW  COMPARISON:  None.  FINDINGS: Cardiomegaly. Hyperinflation of the lungs compatible with COPD. Mild vascular congestion. No overt edema. No confluent opacities or effusions.  IMPRESSION: Cardiomegaly with vascular congestion.  COPD.   Electronically Signed   By: Charlett Nose M.D.   On: 07/14/2013 17:15    EKG Interpretation     Ventricular Rate:  57 PR Interval:  160 QRS  Duration: 84 QT Interval:  422 QTC Calculation: 410 R Axis:   27 Text Interpretation:  Sinus bradycardia with Premature atrial complexes Otherwise normal ECG When compared with ECG of 13-Jul-2010 21:15, No  significant change was found            MDM   1. Urinary tract infection    Pt with stable VS.  Afebrile.  Not septic or distressed.  Plan is outpatient treatment for UTI.  Weakness precautions.     Roney Marion, MD 07/14/13 2002

## 2013-07-14 NOTE — ED Notes (Signed)
Pt is a resident of [address]. Husband was there with her today. As they were going down the hall towards the dining room, pt states she became hot and generalized weakness suddenly occurred. Pt states she had to sit down but denies dizziness or any other symptoms at the time. Pt states she is feeling a little better at this time

## 2013-07-14 NOTE — ED Notes (Signed)
Pt was assisted to restroom while I was transporting another pt. Staff member helped pt to restroom but no urine specimen was obtained, staff member was unaware of order.

## 2013-07-14 NOTE — ED Notes (Signed)
Meal tray ordered 

## 2013-08-09 ENCOUNTER — Ambulatory Visit (INDEPENDENT_AMBULATORY_CARE_PROVIDER_SITE_OTHER): Payer: Medicare PPO | Admitting: Urology

## 2013-08-09 DIAGNOSIS — N3941 Urge incontinence: Secondary | ICD-10-CM

## 2013-08-09 DIAGNOSIS — N8111 Cystocele, midline: Secondary | ICD-10-CM

## 2013-08-09 DIAGNOSIS — R82998 Other abnormal findings in urine: Secondary | ICD-10-CM

## 2013-09-13 ENCOUNTER — Emergency Department (HOSPITAL_COMMUNITY): Payer: Medicare Other

## 2013-09-13 ENCOUNTER — Emergency Department (HOSPITAL_COMMUNITY)
Admission: EM | Admit: 2013-09-13 | Discharge: 2013-09-13 | Disposition: A | Discharge status: Home / Self Care | Payer: Medicare Other | Attending: Emergency Medicine | Admitting: Emergency Medicine

## 2013-09-13 ENCOUNTER — Encounter (HOSPITAL_COMMUNITY): Payer: Self-pay | Admitting: Emergency Medicine

## 2013-09-13 DIAGNOSIS — J40 Bronchitis, not specified as acute or chronic: Secondary | ICD-10-CM

## 2013-09-13 DIAGNOSIS — Z8739 Personal history of other diseases of the musculoskeletal system and connective tissue: Secondary | ICD-10-CM | POA: Insufficient documentation

## 2013-09-13 DIAGNOSIS — Z79899 Other long term (current) drug therapy: Secondary | ICD-10-CM | POA: Insufficient documentation

## 2013-09-13 DIAGNOSIS — Z7982 Long term (current) use of aspirin: Secondary | ICD-10-CM | POA: Insufficient documentation

## 2013-09-13 DIAGNOSIS — R110 Nausea: Secondary | ICD-10-CM | POA: Insufficient documentation

## 2013-09-13 DIAGNOSIS — Z8659 Personal history of other mental and behavioral disorders: Secondary | ICD-10-CM | POA: Insufficient documentation

## 2013-09-13 DIAGNOSIS — R5383 Other fatigue: Secondary | ICD-10-CM

## 2013-09-13 DIAGNOSIS — I10 Essential (primary) hypertension: Secondary | ICD-10-CM | POA: Insufficient documentation

## 2013-09-13 DIAGNOSIS — R5381 Other malaise: Secondary | ICD-10-CM | POA: Insufficient documentation

## 2013-09-13 DIAGNOSIS — Z792 Long term (current) use of antibiotics: Secondary | ICD-10-CM | POA: Insufficient documentation

## 2013-09-13 DIAGNOSIS — Z8669 Personal history of other diseases of the nervous system and sense organs: Secondary | ICD-10-CM | POA: Insufficient documentation

## 2013-09-13 DIAGNOSIS — Z87448 Personal history of other diseases of urinary system: Secondary | ICD-10-CM | POA: Insufficient documentation

## 2013-09-13 MED ORDER — ALBUTEROL SULFATE HFA 108 (90 BASE) MCG/ACT IN AERS: 1.0000 | INHALATION_SPRAY | Freq: Four times a day (QID) | RESPIRATORY_TRACT | Status: DC | PRN

## 2013-09-13 MED ORDER — PREDNISONE 5 MG PO TABS: 10.0000 mg | ORAL_TABLET | Freq: Every day | ORAL | Status: DC

## 2013-09-13 MED ORDER — ALBUTEROL SULFATE (2.5 MG/3ML) 0.083% IN NEBU
5.0000 mg | INHALATION_SOLUTION | Freq: Once | RESPIRATORY_TRACT | Status: AC
  Administered 2013-09-13: 5 mg via RESPIRATORY_TRACT
  Filled 2013-09-13: qty 6

## 2013-09-13 MED ORDER — PREDNISONE 20 MG PO TABS
40.0000 mg | ORAL_TABLET | Freq: Once | ORAL | Status: AC
  Administered 2013-09-13: 40 mg via ORAL
  Filled 2013-09-13: qty 2

## 2013-09-13 MED ORDER — IPRATROPIUM BROMIDE 0.02 % IN SOLN
0.5000 mg | Freq: Once | RESPIRATORY_TRACT | Status: AC
  Administered 2013-09-13: 0.5 mg via RESPIRATORY_TRACT
  Filled 2013-09-13: qty 2.5

## 2013-09-13 NOTE — ED Provider Notes (Signed)
CSN: [id number]     Arrival date & time 09/13/13  1152 History  This chart was scribed for Donnetta Hutching, MD, by Yevette Edwards, ED Scribe. This patient was seen in room APA05/APA05 and the patient's care was started at 12:23 PM.   First MD Initiated Contact with Patient 09/13/13 1159     Chief Complaint  Patient presents with  . Cough   HPI level V caveat for mild dementia. HPI Comments: Felicia Wells is a 80 y.o. female, with a h/o HTN, who presents to the Emergency Department complaining of generalized weakness which began yesterday and worsened this morning. As associated symptoms, the pt has experienced a cough, nausea, and fatigue. She denies difficulty ambulating or SOB. The pt is a non-smoker. She has had recent rounds of Zithromax and Tamiflu.  The pt is a resident of [address].   Past Medical History  Diagnosis Date  . Infections of kidney   . Hypertension   . Osteoporosis   . Eye problem   . Anxiety    Past Surgical History  Procedure Laterality Date  . Eye surgery    . Tonsillectomy    . Thyroid surgery    . Dilation and curettage of uterus    . Brain shunt    . Hernia repair    . Cataract extraction    . Foot surgery     No family history on file. History  Substance Use Topics  . Smoking status: Never Smoker   . Smokeless tobacco: Never Used  . Alcohol Use: No   No OB history provided.  Review of Systems  Constitutional: Positive for fatigue.  Respiratory: Positive for cough. Negative for shortness of breath.   Gastrointestinal: Positive for nausea.  Musculoskeletal: Negative for gait problem.  Neurological: Positive for weakness.    Allergies  Codeine; Darvocet; Demerol; Morphine and related; Other; and Sulfa antibiotics  Home Medications   Current Outpatient Rx  Name  Route  Sig  Dispense  Refill  . aspirin EC 81 MG tablet   Oral   Take 81 mg by mouth daily.         . cholecalciferol (VITAMIN D) 1000 UNITS tablet   Oral   Take 500  Units by mouth 2 (two) times daily.         Marland Kitchen guaiFENesin-dextromethorphan (ROBITUSSIN DM) 100-10 MG/5ML syrup   Oral   Take 5 mLs by mouth every 4 (four) hours as needed for cough.         . hydrALAZINE (APRESOLINE) 10 MG tablet   Oral   Take 10 mg by mouth 2 (two) times daily.         . hydrochlorothiazide (HYDRODIURIL) 25 MG tablet   Oral   Take 25 mg by mouth daily.         . methylcellulose (ARTIFICIAL TEARS) 1 % ophthalmic solution   Both Eyes   Place 1 drop into both eyes 2 (two) times daily.         . Multiple Vitamin (DAILY VITE PO)   Oral   Take 1 tablet by mouth daily.         . multivitamin-lutein (OCUVITE-LUTEIN) CAPS capsule   Oral   Take 1 capsule by mouth daily.         . Nebivolol HCl (BYSTOLIC) 20 MG TABS   Oral   Take 20 mg by mouth 2 (two) times daily.         . Omega 3 1000 MG  CAPS   Oral   Take 1 capsule by mouth 2 (two) times daily.         . potassium chloride (K-DUR) 10 MEQ tablet   Oral   Take 1 tablet (10 mEq total) by mouth daily.   30 tablet   12   . telmisartan (MICARDIS) 80 MG tablet   Oral   Take 80 mg by mouth daily.         . timolol (TIMOPTIC) 0.5 % ophthalmic solution   Left Eye   Place 2 drops into the left eye 2 (two) times daily.         Marland Kitchen. azithromycin (ZITHROMAX) 250 MG tablet   Oral   Take 1-2 tablets by mouth daily. Take 2 tablets on 09/09/13 Take 1 tablet 09/10/13-09/12/13         . TAMIFLU 75 MG capsule   Oral   Take 1 capsule by mouth 2 (two) times daily. Started on 09/06/13, finished 09/10/13          Triage Vitals: BP 180/104  Pulse 62  Temp(Src) 98 F (36.7 C)  Resp 18  SpO2 100%  Physical Exam  Nursing note and vitals reviewed. Constitutional: She is oriented to person, place, and time. She appears well-nourished.  HENT:  Head: Normocephalic and atraumatic.  Eyes: Conjunctivae and EOM are normal. Pupils are equal, round, and reactive to light.  Neck: Normal range of motion. Neck  supple.  Cardiovascular: Normal rate, regular rhythm and normal heart sounds.   Pulmonary/Chest: Effort normal. She has wheezes.  Bilateral expiratory wheezes.   Abdominal: Soft. Bowel sounds are normal.  Musculoskeletal: Normal range of motion.  Neurological: She is alert and oriented to person, place, and time.  Skin: Skin is warm and dry.  Psychiatric: She has a normal mood and affect. Her behavior is normal.    ED Course  Procedures (including critical care time)  DIAGNOSTIC STUDIES: Oxygen Saturation is 100% on room air, normal by my interpretation.    COORDINATION OF CARE:  12:27 PM- Discussed treatment plan with patient, which includes a chest x-ray and a breathing treatment, and the patient agreed to the plan.   Labs Review Labs Reviewed - No data to display Imaging Review Dg Chest 2 View  09/13/2013   CLINICAL DATA:  Cough, hypertension  EXAM: CHEST  2 VIEW  COMPARISON:  07/14/2013  FINDINGS: Cardiomegaly is noted. No acute infiltrate or pulmonary edema. Osteopenia and mild degenerative thoracic spine.  IMPRESSION: No active disease. Osteopenia and mild degenerative changes thoracic spine.   Electronically Signed   By: Natasha MeadLiviu  Pop M.D.   On: 09/13/2013 12:18    EKG Interpretation   None       MDM  No diagnosis found. Patient is in no acute distress. No respiratory distress. She has had both Zithromax and Tamiflu recently. Nebulizer treatment of albuterol/Atrovent helped. We'll discharge with a Ventolin inhaler and prednisone 10 mg for one week.  I personally performed the services described in this documentation, which was scribed in my presence. The recorded information has been reviewed and is accurate.     Donnetta HutchingBrian Gennie Eisinger, MD 09/13/13 [phone]

## 2013-09-13 NOTE — Progress Notes (Signed)
Pt had a bad coughing spell while on her breathing tx. She began coughing , gagging and said she wasn't ableto breath. RT took pt off tx. And gave her some cold and warm water. The pt said she didn't want anymore of that tx.

## 2013-09-13 NOTE — ED Notes (Signed)
Pt resident at [facility].   Is being treated with tamiflu and zpack for cold symptoms x past few days. Denies fever.  Pt finished with meds and still c/o cough and congestion.  Reports nausea but no vomiting.

## 2013-09-13 NOTE — Discharge Instructions (Signed)
Bronchitis Bronchitis is swelling (inflammation) of the air tubes leading to your lungs (bronchi). This causes mucus and a cough. If the swelling gets bad, you may have trouble breathing. HOME CARE   Rest.  Drink enough fluids to keep your pee (urine) clear or pale yellow (unless you have a condition where you have to watch how much you drink).  Only take medicine as told by your doctor. If you were given antibiotic medicines, finish them even if you start to feel better.  Avoid smoke, irritating chemicals, and strong smells. These make the problem worse. Quit smoking if you smoke. This helps your lungs heal faster.  Use a cool mist humidifier. Change the water in the humidifier every day. You can also sit in the bathroom with hot shower running for 5 10 minutes. Keep the door closed.  See your health care provider as told.  Wash your hands often. GET HELP IF: Your problems do not get better after 1 week. GET HELP RIGHT AWAY IF:   Your fever gets worse.  You have chills.  Your chest hurts.  Your problems breathing get worse.  You have blood in your mucus.  You pass out (faint).  You feel lightheaded.  You have a bad headache.  You throw up (vomit) again and again. MAKE SURE YOU:  Understand these instructions.  Will watch your condition.  Will get help right away if you are not doing well or get worse. Document Released: 02/04/2008 Document Revised: 06/08/2013 Document Reviewed: 04/12/2013 [facility] Patient Information 2014 [employer], [place].   Chest x-ray shows no pneumonia. Prescription for prednisone and inhaler.  Recommend over-the-counter cough syrup such as Delsyn.  Fluids. Followup with primary care

## 2013-10-20 ENCOUNTER — Emergency Department (HOSPITAL_COMMUNITY): Payer: Medicare Other

## 2013-10-20 ENCOUNTER — Encounter (HOSPITAL_COMMUNITY): Payer: Self-pay | Admitting: Emergency Medicine

## 2013-10-20 ENCOUNTER — Emergency Department (HOSPITAL_COMMUNITY)
Admission: EM | Admit: 2013-10-20 | Discharge: 2013-10-21 | Disposition: A | Discharge status: Home / Self Care | Payer: Medicare Other | Attending: Emergency Medicine | Admitting: Emergency Medicine

## 2013-10-20 DIAGNOSIS — Z79899 Other long term (current) drug therapy: Secondary | ICD-10-CM | POA: Insufficient documentation

## 2013-10-20 DIAGNOSIS — Z87440 Personal history of urinary (tract) infections: Secondary | ICD-10-CM | POA: Insufficient documentation

## 2013-10-20 DIAGNOSIS — S2239XA Fracture of one rib, unspecified side, initial encounter for closed fracture: Secondary | ICD-10-CM | POA: Insufficient documentation

## 2013-10-20 DIAGNOSIS — S0990XA Unspecified injury of head, initial encounter: Secondary | ICD-10-CM

## 2013-10-20 DIAGNOSIS — I10 Essential (primary) hypertension: Secondary | ICD-10-CM | POA: Insufficient documentation

## 2013-10-20 DIAGNOSIS — Y9210 Unspecified residential institution as the place of occurrence of the external cause: Secondary | ICD-10-CM | POA: Insufficient documentation

## 2013-10-20 DIAGNOSIS — Z7982 Long term (current) use of aspirin: Secondary | ICD-10-CM | POA: Insufficient documentation

## 2013-10-20 DIAGNOSIS — Z8669 Personal history of other diseases of the nervous system and sense organs: Secondary | ICD-10-CM | POA: Insufficient documentation

## 2013-10-20 DIAGNOSIS — W1809XA Striking against other object with subsequent fall, initial encounter: Secondary | ICD-10-CM | POA: Insufficient documentation

## 2013-10-20 DIAGNOSIS — Z8739 Personal history of other diseases of the musculoskeletal system and connective tissue: Secondary | ICD-10-CM | POA: Insufficient documentation

## 2013-10-20 DIAGNOSIS — W19XXXA Unspecified fall, initial encounter: Secondary | ICD-10-CM

## 2013-10-20 DIAGNOSIS — Y9389 Activity, other specified: Secondary | ICD-10-CM | POA: Insufficient documentation

## 2013-10-20 DIAGNOSIS — Z791 Long term (current) use of non-steroidal anti-inflammatories (NSAID): Secondary | ICD-10-CM | POA: Insufficient documentation

## 2013-10-20 DIAGNOSIS — F411 Generalized anxiety disorder: Secondary | ICD-10-CM | POA: Insufficient documentation

## 2013-10-20 DIAGNOSIS — S2231XA Fracture of one rib, right side, initial encounter for closed fracture: Secondary | ICD-10-CM

## 2013-10-20 MED ORDER — ACETAMINOPHEN 500 MG PO TABS
500.0000 mg | ORAL_TABLET | Freq: Once | ORAL | Status: AC
  Administered 2013-10-20: 500 mg via ORAL
  Filled 2013-10-20: qty 1

## 2013-10-20 NOTE — ED Notes (Addendum)
Per EMS unwitnessed fall. Pt. Fell in bathroom. Pt. Reported to EMS that she hit her head and back on floor. Pt. Now denies hitting head and only c/o right side pain. No LOC. Pt. Alert and oriented x4. Pt. Not placed on any spinal precautions by EMS.

## 2013-10-20 NOTE — ED Notes (Signed)
Pt. Now states that she fell and hit the left side of her head on the floor. No abrasion or bleeding noted on assessment.

## 2013-10-20 NOTE — ED Provider Notes (Signed)
CSN: [id number]     Arrival date & time 10/20/13  2112 History  This chart was scribed for Vida Roller, MD by Ardelia Mems, ED Scribe. This patient was seen in room APA05/APA05 and the patient's care was started at 11:09 PM.   Chief Complaint  Patient presents with  . Fall    The history is provided by the patient and the EMS personnel. No language interpreter was used.    HPI Comments: Felicia Wells is a 80 y.o. female with a history of osteoporosis and HTN brought by EMS and accompanied by son to the Emergency Department complaining of a fall that occurred earlier tonight. She states that she is a resident at [facility], and that she fell while going to the bathroom. She states that she fell "very gently". Pt's son states that she was brought to the ED because she was complaining that she hit her head at the time of the fall. Pt states that she is having mild pain in her head described as "soreness" currently. She also states that she landed on her ribs and is complaining of right sided rib pain that is worsened with moving. She denies any other pain or symptoms.  She states that she lost her footing and had a mechanical fall - no c/o CP / palpitations / SOB or weakness.   Past Medical History  Diagnosis Date  . Infections of kidney   . Hypertension   . Osteoporosis   . Eye problem   . Anxiety    Past Surgical History  Procedure Laterality Date  . Eye surgery    . Tonsillectomy    . Thyroid surgery    . Dilation and curettage of uterus    . Brain shunt    . Hernia repair    . Cataract extraction    . Foot surgery     No family history on file. History  Substance Use Topics  . Smoking status: Never Smoker   . Smokeless tobacco: Never Used  . Alcohol Use: No   OB History   Grav Para Term Preterm Abortions TAB SAB Ect Mult Living                 Review of Systems  Musculoskeletal:       Right sided rib pain.  Neurological: Positive for headaches.  All other  systems reviewed and are negative.   Allergies  Codeine; Darvocet; Demerol; Morphine and related; Other; and Sulfa antibiotics  Home Medications   Current Outpatient Rx  Name  Route  Sig  Dispense  Refill  . aspirin EC 81 MG tablet   Oral   Take 81 mg by mouth daily.         . cholecalciferol (VITAMIN D) 1000 UNITS tablet   Oral   Take 500 Units by mouth 2 (two) times daily.         . hydrALAZINE (APRESOLINE) 10 MG tablet   Oral   Take 10 mg by mouth 2 (two) times daily.         . hydrochlorothiazide (HYDRODIURIL) 25 MG tablet   Oral   Take 25 mg by mouth daily.         . methylcellulose (ARTIFICIAL TEARS) 1 % ophthalmic solution   Both Eyes   Place 1 drop into both eyes 2 (two) times daily.         . Multiple Vitamin (DAILY VITE PO)   Oral   Take 1 tablet by  mouth daily.         . nebivolol (BYSTOLIC) 10 MG tablet   Oral   Take 20 mg by mouth 2 (two) times daily.         . Omega 3 1000 MG CAPS   Oral   Take 2,000 mg by mouth daily.         . potassium chloride (K-DUR) 10 MEQ tablet   Oral   Take 1 tablet (10 mEq total) by mouth daily.   30 tablet   12   . telmisartan (MICARDIS) 80 MG tablet   Oral   Take 80 mg by mouth daily.         . timolol (TIMOPTIC) 0.5 % ophthalmic solution   Left Eye   Place 2 drops into the left eye 2 (two) times daily.         Marland Kitchen. albuterol (PROVENTIL HFA;VENTOLIN HFA) 108 (90 BASE) MCG/ACT inhaler   Inhalation   Inhale 1-2 puffs into the lungs every 6 (six) hours as needed for wheezing or shortness of breath.   1 Inhaler   0   . guaiFENesin-dextromethorphan (ROBITUSSIN DM) 100-10 MG/5ML syrup   Oral   Take 5 mLs by mouth every 4 (four) hours as needed for cough.         . naproxen (NAPROSYN) 500 MG tablet   Oral   Take 1 tablet (500 mg total) by mouth 2 (two) times daily with a meal.   30 tablet   0    Triage Vitals: BP 184/73  Pulse 64  Temp(Src) 98.3 F (36.8 C) (Oral)  Resp 18  Ht 5\' 5"   (1.651 m)  Wt 138 lb (62.596 kg)  BMI 22.96 kg/m2  SpO2 94%  Physical Exam  Nursing note and vitals reviewed. Constitutional: She is oriented to person, place, and time. She appears well-developed and well-nourished. No distress.  HENT:  Head: Normocephalic.  No hematomas, no abrasions, no lacerations to the scalp. No battle signs. No hemotympanum. No malocclusion.   Eyes: Conjunctivae and EOM are normal.  No racoon eyes.  Neck: Normal range of motion. Neck supple.  No meningeal signs  Cardiovascular: Normal rate, regular rhythm and normal heart sounds.  Exam reveals no gallop and no friction rub.   No murmur heard. No murmurs, occasional ectopy.  Pulmonary/Chest: Effort normal and breath sounds normal. No respiratory distress. She has no wheezes. She has no rales. She exhibits no tenderness.  Deep breathing without pain.  Abdominal: Soft. Bowel sounds are normal. She exhibits no distension. There is no tenderness. There is no rebound and no guarding.  Musculoskeletal: Normal range of motion. She exhibits tenderness. She exhibits no edema.  No cervical, thoracic or lumbar spine tenderness.  Right lateral posterior tenderness of the ribs.   Neurological: She is alert and oriented to person, place, and time. No cranial nerve deficit.  Skin: Skin is warm and dry. She is not diaphoretic. No erythema.    ED Course  Procedures (including critical care time)  DIAGNOSTIC STUDIES: Oxygen Saturation is 94% on RA, adequate by my interpretation.    COORDINATION OF CARE: 11:15 PM- Discussed plan to obtain diagnostic radiology of pt's ribs. Pt offered pain medication and pt declined to receive anything stronger than Tylenol. Pt advised of plan for treatment and pt agrees.  12:37 AM- Discussed radiology findings.  Medications  acetaminophen (TYLENOL) tablet 500 mg (500 mg Oral Given 10/20/13 2324)   Labs Review Labs Reviewed - No data to display  Imaging Review Dg Ribs Unilateral W/chest  Right  10/21/2013   CLINICAL DATA:  Fall, right rib pain.  EXAM: RIGHT RIBS AND CHEST - 3+ VIEW  COMPARISON:  DG CHEST 2 VIEW dated 09/13/2013  FINDINGS: The cardiac silhouette remains mildly enlarged, mildly tortuous aorta. Similar increased lung volumes with mild chronic interstitial changes, no superimposed pleural effusions or focal consolidations. Minimal right lung base atelectasis versus scarring.  Osteopenia. Scratch possible nondisplaced right posterior ninth rib fracture.  IMPRESSION: Possible nondisplaced right posterior rib rib fracture though, osteopenia decreases sensitivity.  Mild cardiomegaly and COPD without superimposed acute pulmonary process.   Electronically Signed   By: Awilda Metro   On: 10/21/2013 00:01   Ct Head Wo Contrast  10/20/2013   CLINICAL DATA:  Fall.  Left head and neck pain.  EXAM: CT HEAD WITHOUT CONTRAST  CT CERVICAL SPINE WITHOUT CONTRAST  TECHNIQUE: Multidetector CT imaging of the head and cervical spine was performed following the standard protocol without intravenous contrast. Multiplanar CT image reconstructions of the cervical spine were also generated.  COMPARISON:  CT HEAD W/O CM dated 07/13/2010  FINDINGS: CT HEAD FINDINGS  There is atrophy and chronic small vessel disease changes. Visualized paranasal sinuses and mastoids clear. Orbital soft tissues unremarkable.  CT CERVICAL SPINE FINDINGS  Severe degenerative disc disease at C5-6. Moderate to advanced bilateral degenerative facet disease. 3 mm of anterolisthesis of C4 on C5, likely related to facet arthropathy. Prevertebral soft tissues are normal. No fracture. No epidural or paraspinal hematoma.  IMPRESSION: No acute intracranial abnormality.  Degenerative disc and facet disease in the cervical spine. Associated slight anterolisthesis of C4 on C5. No acute bony abnormality.   Electronically Signed   By: Charlett Nose M.D.   On: 10/20/2013 22:36   Ct Cervical Spine Wo Contrast  10/20/2013   CLINICAL DATA:   Fall.  Left head and neck pain.  EXAM: CT HEAD WITHOUT CONTRAST  CT CERVICAL SPINE WITHOUT CONTRAST  TECHNIQUE: Multidetector CT imaging of the head and cervical spine was performed following the standard protocol without intravenous contrast. Multiplanar CT image reconstructions of the cervical spine were also generated.  COMPARISON:  CT HEAD W/O CM dated 07/13/2010  FINDINGS: CT HEAD FINDINGS  There is atrophy and chronic small vessel disease changes. Visualized paranasal sinuses and mastoids clear. Orbital soft tissues unremarkable.  CT CERVICAL SPINE FINDINGS  Severe degenerative disc disease at C5-6. Moderate to advanced bilateral degenerative facet disease. 3 mm of anterolisthesis of C4 on C5, likely related to facet arthropathy. Prevertebral soft tissues are normal. No fracture. No epidural or paraspinal hematoma.  IMPRESSION: No acute intracranial abnormality.  Degenerative disc and facet disease in the cervical spine. Associated slight anterolisthesis of C4 on C5. No acute bony abnormality.   Electronically Signed   By: Charlett Nose M.D.   On: 10/20/2013 22:36    EKG Interpretation    Date/Time:  Thursday October 20 2013 21:29:39 EST Ventricular Rate:  62 PR Interval:  120 QRS Duration: 80 QT Interval:  414 QTC Calculation: 420 R Axis:   5 Text Interpretation:  Sinus rhythm with Premature atrial complexes Otherwise normal ECG When compared with ECG of 14-Jul-2013 16:09, No significant change was found Confirmed by Gracelynn Bircher  MD, Aayra Hornbaker ([id number]) on 10/20/2013 11:08:58 PM            MDM   Final diagnoses:  Fracture of rib of right side  Fall  Minor head injury    The patient has no crepitance  or subcutaneous emphysema on exam, she does have tenderness that is consistent with the x-ray interpretation of a possible posterior right rib fracture. She has been given an incentive spirometer, pain medications were offered and accepted, at this time the patient appears stable for discharge.  Definitive fracture care provided  Procedure Note:  Definitive Fracture Care:  Definitive fracture care performred for the rib fracture.  This included analgesia in the ED, incentive spiromoterly, and prescriptions for outpatient pain control which have been provided.  I have counseled the pt on possible complications of the fractures and signs and symptoms which would mandate return for further care as well as the utility of RICE therapy. The patient has expressed their understanding.  Meds given in ED:  Medications  acetaminophen (TYLENOL) tablet 500 mg (500 mg Oral Given 10/20/13 2324)    Discharge Medication List as of 10/21/2013 12:35 AM    START taking these medications   Details  naproxen (NAPROSYN) 500 MG tablet Take 1 tablet (500 mg total) by mouth 2 (two) times daily with a meal., Starting 10/21/2013, Until Discontinued, Print        I personally performed the services described in this documentation, which was scribed in my presence. The recorded information has been reviewed and is accurate.    Vida Roller, MD 10/21/13 [phone]

## 2013-10-21 MED ORDER — NAPROXEN 500 MG PO TABS: 500.0000 mg | ORAL_TABLET | Freq: Two times a day (BID) | ORAL | Status: DC

## 2013-10-21 NOTE — Discharge Instructions (Signed)
Your xray shows an isolated POSSIBLE rib fracture - please use the pain medicine as prescribed - use the incentive spirometer ever 30 minutes - follow up with your doctor as needed.  This will heal by itself over the next 6-8 weeks.  Please call your doctor for a followup appointment within 24-48 hours. When you talk to your doctor please let them know that you were seen in the emergency department and have them acquire all of your records so that they can discuss the findings with you and formulate a treatment plan to fully care for your new and ongoing problems.

## 2014-06-06 ENCOUNTER — Ambulatory Visit (INDEPENDENT_AMBULATORY_CARE_PROVIDER_SITE_OTHER): Payer: Medicare Other | Admitting: Urology

## 2014-06-06 DIAGNOSIS — N8111 Cystocele, midline: Secondary | ICD-10-CM

## 2014-06-06 DIAGNOSIS — N390 Urinary tract infection, site not specified: Secondary | ICD-10-CM

## 2015-03-13 ENCOUNTER — Ambulatory Visit (INDEPENDENT_AMBULATORY_CARE_PROVIDER_SITE_OTHER): Payer: Medicare Other | Admitting: Urology

## 2015-03-13 DIAGNOSIS — N8111 Cystocele, midline: Secondary | ICD-10-CM | POA: Diagnosis not present

## 2015-09-12 ENCOUNTER — Emergency Department (HOSPITAL_COMMUNITY): Payer: Medicare Other

## 2015-09-12 ENCOUNTER — Encounter (HOSPITAL_COMMUNITY): Payer: Self-pay | Admitting: *Deleted

## 2015-09-12 ENCOUNTER — Inpatient Hospital Stay (HOSPITAL_COMMUNITY)
Admission: EM | Admit: 2015-09-12 | Discharge: 2015-09-14 | DRG: 378 | Disposition: A | Discharge status: Nursing Home (Includes ICF) | Payer: Medicare Other | Attending: Pulmonary Disease | Admitting: Pulmonary Disease

## 2015-09-12 DIAGNOSIS — Z66 Do not resuscitate: Secondary | ICD-10-CM | POA: Diagnosis present

## 2015-09-12 DIAGNOSIS — D62 Acute posthemorrhagic anemia: Secondary | ICD-10-CM | POA: Diagnosis present

## 2015-09-12 DIAGNOSIS — K922 Gastrointestinal hemorrhage, unspecified: Secondary | ICD-10-CM | POA: Insufficient documentation

## 2015-09-12 DIAGNOSIS — F0390 Unspecified dementia without behavioral disturbance: Secondary | ICD-10-CM | POA: Diagnosis present

## 2015-09-12 DIAGNOSIS — K5731 Diverticulosis of large intestine without perforation or abscess with bleeding: Secondary | ICD-10-CM | POA: Diagnosis present

## 2015-09-12 DIAGNOSIS — M810 Age-related osteoporosis without current pathological fracture: Secondary | ICD-10-CM | POA: Diagnosis present

## 2015-09-12 DIAGNOSIS — Z515 Encounter for palliative care: Secondary | ICD-10-CM | POA: Insufficient documentation

## 2015-09-12 DIAGNOSIS — K921 Melena: Secondary | ICD-10-CM | POA: Diagnosis present

## 2015-09-12 DIAGNOSIS — K5730 Diverticulosis of large intestine without perforation or abscess without bleeding: Secondary | ICD-10-CM | POA: Diagnosis not present

## 2015-09-12 DIAGNOSIS — Z7982 Long term (current) use of aspirin: Secondary | ICD-10-CM | POA: Diagnosis not present

## 2015-09-12 DIAGNOSIS — I10 Essential (primary) hypertension: Secondary | ICD-10-CM | POA: Diagnosis present

## 2015-09-12 LAB — APTT: aPTT: 29 seconds (ref 24–37)

## 2015-09-12 LAB — COMPREHENSIVE METABOLIC PANEL
ALBUMIN: 3.3 g/dL — AB (ref 3.5–5.0)
ALT: 20 U/L (ref 14–54)
AST: 28 U/L (ref 15–41)
Alkaline Phosphatase: 61 U/L (ref 38–126)
Anion gap: 6 (ref 5–15)
BILIRUBIN TOTAL: 0.5 mg/dL (ref 0.3–1.2)
BUN: 20 mg/dL (ref 6–20)
CALCIUM: 9.1 mg/dL (ref 8.9–10.3)
CO2: 32 mmol/L (ref 22–32)
Chloride: 96 mmol/L — ABNORMAL LOW (ref 101–111)
Creatinine, Ser: 0.85 mg/dL (ref 0.44–1.00)
GFR calc Af Amer: >60 mL/min (ref >60)
GFR calc non Af Amer: 57 mL/min — ABNORMAL LOW (ref >60)
GLUCOSE: 110 mg/dL — AB (ref 65–99)
Potassium: 3.9 mmol/L (ref 3.5–5.1)
Sodium: 134 mmol/L — ABNORMAL LOW (ref 135–145)
Total Protein: 6.3 g/dL — ABNORMAL LOW (ref 6.5–8.1)

## 2015-09-12 LAB — CBC
HCT: 26.8 % — ABNORMAL LOW (ref 36.0–46.0)
HCT: 27.6 % — ABNORMAL LOW (ref 36.0–46.0)
HCT: 32.4 % — ABNORMAL LOW (ref 36.0–46.0)
HEMOGLOBIN: 9.3 g/dL — AB (ref 12.0–15.0)
Hemoglobin: 9 g/dL — ABNORMAL LOW (ref 12.0–15.0)
Hemoglobin: 11 g/dL — ABNORMAL LOW (ref 12.0–15.0)
MCH: 31.4 pg (ref 26.0–34.0)
MCH: 31.1 pg (ref 26.0–34.0)
MCH: 30.1 pg (ref 26.0–34.0)
MCHC: 33.6 g/dL (ref 30.0–36.0)
MCHC: 33.7 g/dL (ref 30.0–36.0)
MCHC: 34 g/dL (ref 30.0–36.0)
MCV: 93.4 fL (ref 78.0–100.0)
MCV: 92.3 fL (ref 78.0–100.0)
MCV: 88.5 fL (ref 78.0–100.0)
PLATELETS: 262 10*3/uL (ref 150–400)
PLATELETS: 215 10*3/uL (ref 150–400)
PLATELETS: 228 10*3/uL (ref 150–400)
RBC: 2.87 MIL/uL — ABNORMAL LOW (ref 3.87–5.11)
RBC: 2.99 MIL/uL — ABNORMAL LOW (ref 3.87–5.11)
RBC: 3.66 MIL/uL — AB (ref 3.87–5.11)
RDW: 14 % (ref 11.5–15.5)
RDW: 13.8 % (ref 11.5–15.5)
RDW: 16.1 % — ABNORMAL HIGH (ref 11.5–15.5)
WBC: 12.1 10*3/uL — AB (ref 4.0–10.5)
WBC: 9.7 10*3/uL (ref 4.0–10.5)
WBC: 11.1 10*3/uL — AB (ref 4.0–10.5)

## 2015-09-12 LAB — CBC WITH DIFFERENTIAL/PLATELET
BASOS PCT: 1 %
Basophils Absolute: 0.1 10*3/uL (ref 0.0–0.1)
EOS ABS: 0.1 10*3/uL (ref 0.0–0.7)
EOS PCT: 2 %
HEMATOCRIT: 35.9 % — AB (ref 36.0–46.0)
Hemoglobin: 11.6 g/dL — ABNORMAL LOW (ref 12.0–15.0)
Lymphocytes Relative: 24 %
Lymphs Abs: 1.9 10*3/uL (ref 0.7–4.0)
MCH: 30.3 pg (ref 26.0–34.0)
MCHC: 32.3 g/dL (ref 30.0–36.0)
MCV: 93.7 fL (ref 78.0–100.0)
MONOS PCT: 7 %
Monocytes Absolute: 0.6 10*3/uL (ref 0.1–1.0)
Neutro Abs: 5.4 10*3/uL (ref 1.7–7.7)
Neutrophils Relative %: 66 %
Platelets: 291 10*3/uL (ref 150–400)
RBC: 3.83 MIL/uL — ABNORMAL LOW (ref 3.87–5.11)
RDW: 14.1 % (ref 11.5–15.5)
WBC: 8 10*3/uL (ref 4.0–10.5)

## 2015-09-12 LAB — LACTIC ACID, PLASMA: Lactic Acid, Venous: 0.8 mmol/L (ref 0.5–2.0)

## 2015-09-12 LAB — LIPASE, BLOOD: LIPASE: 46 U/L (ref 11–51)

## 2015-09-12 LAB — ABO/RH: ABO/RH(D): O POS

## 2015-09-12 LAB — PROTIME-INR
INR: 1.2 (ref 0.00–1.49)
Prothrombin Time: 15.4 seconds — ABNORMAL HIGH (ref 11.6–15.2)

## 2015-09-12 LAB — PREPARE RBC (CROSSMATCH)

## 2015-09-12 MED ORDER — DIAZEPAM 2 MG PO TABS
2.0000 mg | ORAL_TABLET | Freq: Two times a day (BID) | ORAL | Status: DC | PRN
Start: 2015-09-12 — End: 2015-09-14
  Administered 2015-09-12: 2 mg via ORAL
  Filled 2015-09-12: qty 1

## 2015-09-12 MED ORDER — SODIUM CHLORIDE 0.9 % IV SOLN
10.0000 mL/h | Freq: Once | INTRAVENOUS | Status: DC
Start: 2015-09-12 — End: 2015-09-14

## 2015-09-12 MED ORDER — DIATRIZOATE MEGLUMINE & SODIUM 66-10 % PO SOLN
ORAL | Status: AC
  Filled 2015-09-12: qty 30

## 2015-09-12 MED ORDER — TIMOLOL MALEATE 0.5 % OP SOLN: 2.0000 [drp] | Freq: Two times a day (BID) | OPHTHALMIC | Status: DC

## 2015-09-12 MED ORDER — FAMOTIDINE 20 MG PO TABS
20.0000 mg | ORAL_TABLET | Freq: Two times a day (BID) | ORAL | Status: DC
  Administered 2015-09-12 – 2015-09-14 (×4): 20 mg via ORAL
  Filled 2015-09-12 (×4): qty 1

## 2015-09-12 MED ORDER — ALBUTEROL SULFATE HFA 108 (90 BASE) MCG/ACT IN AERS: 1.0000 | INHALATION_SPRAY | Freq: Four times a day (QID) | RESPIRATORY_TRACT | Status: DC | PRN

## 2015-09-12 MED ORDER — ONDANSETRON HCL 4 MG/2ML IJ SOLN
4.0000 mg | Freq: Four times a day (QID) | INTRAMUSCULAR | Status: DC | PRN
  Administered 2015-09-12: 4 mg via INTRAVENOUS
  Filled 2015-09-12: qty 2

## 2015-09-12 MED ORDER — POLYVINYL ALCOHOL 1.4 % OP SOLN
1.0000 [drp] | Freq: Two times a day (BID) | OPHTHALMIC | Status: DC
  Administered 2015-09-12 – 2015-09-14 (×5): 1 [drp] via OPHTHALMIC
  Filled 2015-09-12 (×4): qty 15

## 2015-09-12 MED ORDER — VITAMIN D 1000 UNITS PO TABS
500.0000 [IU] | ORAL_TABLET | Freq: Two times a day (BID) | ORAL | Status: DC
  Administered 2015-09-12 – 2015-09-14 (×5): 500 [IU] via ORAL
  Filled 2015-09-12 (×5): qty 1

## 2015-09-12 MED ORDER — KCL IN DEXTROSE-NACL 20-5-0.9 MEQ/L-%-% IV SOLN
INTRAVENOUS | Status: DC
Start: 2015-09-12 — End: 2015-09-14
  Administered 2015-09-12: 1000 mL via INTRAVENOUS
  Administered 2015-09-13 – 2015-09-14 (×3): via INTRAVENOUS

## 2015-09-12 MED ORDER — SODIUM CHLORIDE 0.9 % IV BOLUS (SEPSIS)
1000.0000 mL | Freq: Once | INTRAVENOUS | Status: AC
  Administered 2015-09-12: 1000 mL via INTRAVENOUS

## 2015-09-12 MED ORDER — IOHEXOL 300 MG/ML  SOLN
100.0000 mL | Freq: Once | INTRAMUSCULAR | Status: AC | PRN
  Administered 2015-09-12: 100 mL via INTRAVENOUS

## 2015-09-12 MED ORDER — FENTANYL CITRATE (PF) 100 MCG/2ML IJ SOLN
12.5000 ug | INTRAMUSCULAR | Status: DC | PRN
  Filled 2015-09-12: qty 2

## 2015-09-12 MED ORDER — SODIUM CHLORIDE 0.9 % IV SOLN
1000.0000 mL | INTRAVENOUS | Status: DC
  Administered 2015-09-12: 1000 mL via INTRAVENOUS

## 2015-09-12 MED ORDER — SODIUM CHLORIDE 0.9 % IJ SOLN
3.0000 mL | Freq: Two times a day (BID) | INTRAMUSCULAR | Status: DC
  Administered 2015-09-12 – 2015-09-13 (×2): 3 mL via INTRAVENOUS

## 2015-09-12 MED ORDER — ALBUTEROL SULFATE (2.5 MG/3ML) 0.083% IN NEBU: 2.5000 mg | INHALATION_SOLUTION | Freq: Four times a day (QID) | RESPIRATORY_TRACT | Status: DC | PRN

## 2015-09-12 NOTE — ED Notes (Signed)
Pt had large bloody BM. Aprox 400 cc

## 2015-09-12 NOTE — ED Provider Notes (Addendum)
TIME SEEN: 4:30 AM  CHIEF COMPLAINT: abdominal pain, hematochezia  HPI: Pt is a 82 y.o. female with history of paroxysmal atrial fibrillation on aspirin,hypertension, osteoporosis who lives at [facility] who presents to the emergency department for abdominal pain, hematochezia that started tonight. No history of melena. No vomiting. No fever. Patient complains of diffuse 4 out of 10 abdominal pain. She is unable to further describe this pain. She states it does not radiate. No aggravating or alleviating factors.  Denies history of abdominal surgery. Denies prior history of GI bleed. Denies history of colonoscopy. Does not appear to be on anticoagulation or other antiplatelet agent other than aspirin.PCP is Dr. Juanetta Gosling.   EMS also had a rhythm strip which appeared to show sinus rhythm with significant pauses. It is unclear if this was an AV block. Will repeat EKG. She denies any chest pain or shortness of breath. Does not have a pacemaker.  Patient is on bysystolic 20 mg twice a day. Last dose was 5 PM yesterday.   Patient confirms that she would want admission, blood transfusion, surgery, colonoscopy if needed. She states that she is a DO NOT RESUSCITATE/DO NOT INTUBATE.  ROS: See HPI Constitutional: no fever  Eyes: no drainage  ENT: no runny nose   Cardiovascular:  no chest pain  Resp: no SOB  GI: no vomiting GU: no dysuria Integumentary: no rash  Allergy: no hives  Musculoskeletal: no leg swelling  Neurological: no slurred speech ROS otherwise negative  PAST MEDICAL HISTORY/PAST SURGICAL HISTORY:  Past Medical History  Diagnosis Date  . Infections of kidney   . Hypertension   . Osteoporosis   . Eye problem   . Anxiety     MEDICATIONS:  Prior to Admission medications   Medication Sig Start Date End Date Taking? Authorizing Provider  albuterol (PROVENTIL HFA;VENTOLIN HFA) 108 (90 BASE) MCG/ACT inhaler Inhale 1-2 puffs into the lungs every 6 (six) hours as needed for wheezing or  shortness of breath. 09/13/13   Donnetta Hutching, MD  aspirin EC 81 MG tablet Take 81 mg by mouth daily.    Historical Provider, MD  cholecalciferol (VITAMIN D) 1000 UNITS tablet Take 500 Units by mouth 2 (two) times daily.    Historical Provider, MD  guaiFENesin-dextromethorphan (ROBITUSSIN DM) 100-10 MG/5ML syrup Take 5 mLs by mouth every 4 (four) hours as needed for cough.    Historical Provider, MD  hydrALAZINE (APRESOLINE) 10 MG tablet Take 10 mg by mouth 2 (two) times daily.    Historical Provider, MD  hydrochlorothiazide (HYDRODIURIL) 25 MG tablet Take 25 mg by mouth daily.    Historical Provider, MD  methylcellulose (ARTIFICIAL TEARS) 1 % ophthalmic solution Place 1 drop into both eyes 2 (two) times daily.    Historical Provider, MD  Multiple Vitamin (DAILY VITE PO) Take 1 tablet by mouth daily.    Historical Provider, MD  naproxen (NAPROSYN) 500 MG tablet Take 1 tablet (500 mg total) by mouth 2 (two) times daily with a meal. 10/21/13   Eber Hong, MD  nebivolol (BYSTOLIC) 10 MG tablet Take 20 mg by mouth 2 (two) times daily.    Historical Provider, MD  Omega 3 1000 MG CAPS Take 2,000 mg by mouth daily.    Historical Provider, MD  potassium chloride (K-DUR) 10 MEQ tablet Take 1 tablet (10 mEq total) by mouth daily. 12/04/12   Kari Baars, MD  telmisartan (MICARDIS) 80 MG tablet Take 80 mg by mouth daily.    Historical Provider, MD  timolol (TIMOPTIC)  0.5 % ophthalmic solution Place 2 drops into the left eye 2 (two) times daily.    Historical Provider, MD    ALLERGIES:  Allergies  Allergen Reactions  . Codeine Nausea Only  . Darvocet [Propoxyphene N-Acetaminophen] Nausea Only  . Demerol Nausea Only  . Morphine And Related     opiods  . Other Other (See Comments)    Sensitive to Cholesterol medications and blood pressure medications , possible fluid retention  . Sulfa Antibiotics Rash    SOCIAL HISTORY:  Social History  Substance Use Topics  . Smoking status: Never Smoker   .  Smokeless tobacco: Never Used  . Alcohol Use: No    FAMILY HISTORY: No family history on file.  EXAM: BP 145/79 mmHg  Pulse 72  Temp(Src) 97.9 F (36.6 C) (Oral)  Resp 18  SpO2 97% CONSTITUTIONAL: Alert and oriented to person and place but not time and responds appropriately to questions. In no distress, elderly, nontoxic HEAD: Normocephalic EYES: Conjunctivae clear, PERRL ENT: normal nose; no rhinorrhea; moist mucous membranes; pharynx without lesions noted NECK: Supple, no meningismus, no LAD  CARD: RRR; S1 and S2 appreciated; no murmurs, no clicks, no rubs, no gallops RESP: Normal chest excursion without splinting or tachypnea; breath sounds clear and equal bilaterally; no wheezes, no rhonchi, no rales, no hypoxia or respiratory distress, speaking full sentences ABD/GI: Normal bowel sounds; non-distended; soft,diffusely tender to palpation with intermittent voluntary guarding, no rebound, no peritoneal signs RECTUM:  Large amount of dark red blood coming from the rectum, no melena, no hemorrhoids appreciated BACK:  The back appears normal and is non-tender to palpation, there is no CVA tenderness EXT: Normal ROM in all joints; non-tender to palpation; no edema; normal capillary refill; no cyanosis, no calf tenderness or swelling    SKIN: Normal color for age and race; warm NEURO: Moves all extremities equally, sensation to light touch intact diffusely, cranial nerves II through XII intact PSYCH: The patient's mood and manner are appropriate. Grooming and personal hygiene are appropriate.  MEDICAL DECISION MAKING: Pt here with large amount of hematochezia. She is hemodynamically stable. She is diffusely tender to palpation throughout her abdomen. Differential diagnosis includes diverticular bleed, colitis, ischemic bowel. She has a nonsurgical abdomen currently. We'll obtain labs, CT of her abdomen and pelvis. We'll give IV fluids and continue to monitor her vital signs closely. Will  place 2 peripheral IVs.  ED PROGRESS: 5:00 AM  Pt's EKG shows atrial flutter with PVCs. She states she does have a history of atrial fibrillation, atrial flutter found by a doctor in [place]. Patient has had a large bloody bowel movement with approximately 300-400 mL of bright red blood per rectum. Still hemodynamically stable.   5:10 AM  Pt's initial EKG looked like atrial flutter with PVCs. Voltage was very low on his EKG therefore was repeated. Repeat EKG appears to show sinus arrhythmia with PACs and a sinus pause that lasted approximately 1.4 seconds.   5:35 AM  Pt's hemoglobin is 11.6. Platelets normal. Last hemoglobin was in 2014 and was 14. Coags also normal. Lactate is 0.8. BUN and creatinine normal. Patient is still hemodynamically stable. Awaiting CT scan for further violation for her mild abdominal pain. Repeat abdominal exam reveals that her abdomen is still non-peritoneal. Discussed with Dr. Conley Rolls with hospitalist service for admission to step down. Will consult gastroenterology on call as well.   5:40 AM  Spoke with Dr. Darrick Penna with GI who will see pt in the AM.  Pt  still HD stable.   6:20 AM  Pt's CT scan shows diverticulosis without diverticulitis and an acute diverticular bleed. No ischemic colitis. Patient's son is now at bedside and will prefer medical management without colonoscopy if possible. Patient admitted to hospitalist service to step down. Still hemodynamically stable.   6:35 AM  Pt has had another large bloody bowel movement with approximate 400 mL. Given she has now lost approximately 800 mL of blood in the emergency department we'll give her 2 units of packed red blood cells. Son at bedside agrees with this plan. He is her POA.  She is still hemodynamically stable. He now agrees that if she needs a colonoscopy he would like this to be done. He states that she has had diverticulosis before. Her gastroenterologist is Dr. Karilyn Cotaehman.  He confirms that she is a DNR, DNI.   Dr. Conley RollsLe updated with plan.     EKG Interpretation  Date/Time:  Wednesday September 12 2015 04:47:58 EST Ventricular Rate:  63 PR Interval:    QRS Duration: 123 QT Interval:  489 QTC Calculation: 501 R Axis:   -14 Text Interpretation:  Atrial flutter Ventricular premature complex Nonspecific intraventricular conduction delay Minimal ST depression, lateral leads Minimal ST elevation, inferior leads Confirmed by Elayah Klooster,  DO, Dessa Ledee [phone]([id number]) on 09/12/2015 5:01:22 AM       EKG Interpretation  Date/Time:  Wednesday September 12 2015 04:57:22 EST Ventricular Rate:  71 PR Interval:  124 QRS Duration: 90 QT Interval:  453 QTC Calculation: 492 R Axis:   -20 Text Interpretation:  Sinus rhythm Atrial premature complex Sinus pause Borderline left axis deviation RSR' in V1 or V2, right VCD or RVH Borderline prolonged QT interval Confirmed by Averil Digman,  DO, Malayja Freund ([id number] on 09/12/2015 5:09:25 AM        CRITICAL CARE Performed by: Raelyn NumberWARD, Nautia Lem N   Total critical care time: 45 minutes  Critical care time was exclusive of separately billable procedures and treating other patients.  Critical care was necessary to treat or prevent imminent or life-threatening deterioration.  Critical care was time spent personally by me on the following activities: development of treatment plan with patient and/or surrogate as well as nursing, discussions with consultants, evaluation of patient's response to treatment, examination of patient, obtaining history from patient or surrogate, ordering and performing treatments and interventions, ordering and review of laboratory studies, ordering and review of radiographic studies, pulse oximetry and re-evaluation of patient's condition.   Layla MawKristen N Cheris Tweten, DO 09/12/15 [phone]  Layla MawKristen N Johntay Doolen, DO 09/12/15 [phone]  Layla MawKristen N Arik Husmann, DO 09/12/15 [phone]

## 2015-09-12 NOTE — Progress Notes (Signed)
Patient begun on famotidine 20 mg po bid  And Ondansetron 4 mg iv q6 hours prn.

## 2015-09-12 NOTE — ED Notes (Signed)
This nurse called floor and left name and number when oncoming nurse is ready for report.

## 2015-09-12 NOTE — Consult Note (Signed)
Reason for Consult:diverticular bleed Referring Physician:   [name] is an 82 y.o. female.  HPI: Admitted thru the ED early this am with rectal bleeding. Patient is a resident of the [facility]. Son is giving hx. Patient apparently went to the BR at the [facility] and a significant amt of blood was noted in the commode. She was transported the ED. Hemoglobin on admission at 450 am was 11.6. She has been changed x 3 while in the ED With a significant amt of blood on the [place]. Her son reports she has never had a diverticular bleed but does have a hx of diverticulitis.  She takes baby ASA daily.  CT this am revealed:  IMPRESSION: Extensive colonic diverticulosis with active diverticular bleed in the proximal ascending colon. It looks like her last colonoscopy was in 2006 which revealed pan colonic diverticulosis. Hx significant for HTN.    CBC    Component Value Date/Time   WBC 8.0 09/12/2015 0450   RBC 3.83* 09/12/2015 0450   HGB 11.6* 09/12/2015 0450   HCT 35.9* 09/12/2015 0450   PLT 291 09/12/2015 0450   MCV 93.7 09/12/2015 0450   MCH 30.3 09/12/2015 0450   MCHC 32.3 09/12/2015 0450   RDW 14.1 09/12/2015 0450   LYMPHSABS 1.9 09/12/2015 0450   MONOABS 0.6 09/12/2015 0450   EOSABS 0.1 09/12/2015 0450   BASOSABS 0.1 09/12/2015 0450       Past Medical History  Diagnosis Date  . Infections of kidney   . Hypertension   . Osteoporosis   . Eye problem   . Anxiety     Past Surgical History  Procedure Laterality Date  . Eye surgery    . Tonsillectomy    . Thyroid surgery    . Dilation and curettage of uterus    . Brain shunt    . Hernia repair    . Cataract extraction    . Foot surgery      History reviewed. No pertinent family history.  Social History:  reports that she has never smoked. She has never used smokeless tobacco. She reports that she does not drink alcohol or use illicit drugs.  Allergies:  Allergies  Allergen Reactions  .  Codeine Nausea Only  . Darvocet [Propoxyphene N-Acetaminophen] Nausea Only  . Demerol Nausea Only  . Morphine And Related     opiods  . Other Other (See Comments)    Sensitive to Cholesterol medications and blood pressure medications , possible fluid retention  . Sulfa Antibiotics Rash    Medications: I have reviewed the patient's current medications.  Results for orders placed or performed during the hospital encounter of 09/12/15 (from the past 48 hour(s))  Comprehensive metabolic panel     Status: Abnormal   Collection Time: 09/12/15  4:50 AM  Result Value Ref Range   Sodium 134 (L) 135 - 145 mmol/L   Potassium 3.9 3.5 - 5.1 mmol/L   Chloride 96 (L) 101 - 111 mmol/L   CO2 32 22 - 32 mmol/L   Glucose, Bld 110 (H) 65 - 99 mg/dL   BUN 20 6 - 20 mg/dL   Creatinine, Ser 0.85 0.44 - 1.00 mg/dL   Calcium 9.1 8.9 - 10.3 mg/dL   Total Protein 6.3 (L) 6.5 - 8.1 g/dL   Albumin 3.3 (L) 3.5 - 5.0 g/dL   AST 28 15 - 41 U/L   ALT 20 14 - 54 U/L   Alkaline Phosphatase 61 38 - 126 U/L  Total Bilirubin 0.5 0.3 - 1.2 mg/dL   GFR calc non Af Amer 57 (L) >60 mL/min   GFR calc Af Amer >60 >60 mL/min    Comment: (NOTE) The eGFR has been calculated using the CKD EPI equation. This calculation has not been validated in all clinical situations. eGFR's persistently <60 mL/min signify possible Chronic Kidney Disease.    Anion gap 6 5 - 15  CBC WITH DIFFERENTIAL     Status: Abnormal   Collection Time: 09/12/15  4:50 AM  Result Value Ref Range   WBC 8.0 4.0 - 10.5 K/uL   RBC 3.83 (L) 3.87 - 5.11 MIL/uL   Hemoglobin 11.6 (L) 12.0 - 15.0 g/dL   HCT 35.9 (L) 36.0 - 46.0 %   MCV 93.7 78.0 - 100.0 fL   MCH 30.3 26.0 - 34.0 pg   MCHC 32.3 30.0 - 36.0 g/dL   RDW 14.1 11.5 - 15.5 %   Platelets 291 150 - 400 K/uL   Neutrophils Relative % 66 %   Neutro Abs 5.4 1.7 - 7.7 K/uL   Lymphocytes Relative 24 %   Lymphs Abs 1.9 0.7 - 4.0 K/uL   Monocytes Relative 7 %   Monocytes Absolute 0.6 0.1 - 1.0  K/uL   Eosinophils Relative 2 %   Eosinophils Absolute 0.1 0.0 - 0.7 K/uL   Basophils Relative 1 %   Basophils Absolute 0.1 0.0 - 0.1 K/uL  Lipase, blood     Status: None   Collection Time: 09/12/15  4:50 AM  Result Value Ref Range   Lipase 46 11 - 51 U/L  APTT     Status: None   Collection Time: 09/12/15  4:50 AM  Result Value Ref Range   aPTT 29 24 - 37 seconds  Protime-INR     Status: Abnormal   Collection Time: 09/12/15  4:50 AM  Result Value Ref Range   Prothrombin Time 15.4 (H) 11.6 - 15.2 seconds   INR 1.20 0.00 - 1.49  Type and screen [facility]     Status: None (Preliminary result)   Collection Time: 09/12/15  4:50 AM  Result Value Ref Range   ABO/RH(D) O POS    Antibody Screen NEG    Sample Expiration 09/15/2015    Unit Number [id number]    Blood Component Type RED CELLS,LR    Unit division 00    Status of Unit ALLOCATED    Transfusion Status OK TO TRANSFUSE    Crossmatch Result Compatible    Unit Number [id number]    Blood Component Type RED CELLS,LR    Unit division 00    Status of Unit ALLOCATED    Transfusion Status OK TO TRANSFUSE    Crossmatch Result Compatible   Lactic acid, plasma     Status: None   Collection Time: 09/12/15  4:50 AM  Result Value Ref Range   Lactic Acid, Venous 0.8 0.5 - 2.0 mmol/L  Prepare RBC     Status: None   Collection Time: 09/12/15  4:50 AM  Result Value Ref Range   Order Confirmation ORDER PROCESSED BY BLOOD BANK   ABO/Rh     Status: None   Collection Time: 09/12/15  4:50 AM  Result Value Ref Range   ABO/RH(D) O POS     Ct Abdomen Pelvis W Contrast  09/12/2015  CLINICAL DATA:  82 year old female with abdominal pain and GI bleed. EXAM: CT ABDOMEN AND PELVIS WITH CONTRAST TECHNIQUE: Multidetector CT imaging of the abdomen and pelvis  was performed using the standard protocol following bolus administration of intravenous contrast. CONTRAST:  116m OMNIPAQUE IOHEXOL 300 MG/ML  SOLN COMPARISON:  CT dated  07/18/2012 FINDINGS: Cardiomegaly. The visualized lung bases are clear. No intra-abdominal free air or free fluid. A 1 cm right hepatic hypodense lesion is not well characterized. The gallbladder, pancreas, spleen, and adrenal glands appear unremarkable. Right renal cortical irregularity likely related to prior infarct and scarring. There is no hydronephrosis on either side. The visualized ureters appear unremarkable. There is apparent diffuse thickening of the bladder wall which may be partly related to underdistention. Cystitis is not excluded. Correlation with urinalysis recommended. The uterus is anteverted. A device in the lower uterus may represent a pessary. There is extensive sigmoid and colonic diverticulosis without active inflammatory changes. There is high attenuating material extending from a diverticulum in the proximal ascending colon compatible with diverticular bleed. Loose stool noted within the colon compatible with diarrheal state. There is no evidence of bowel obstruction. Normal appendix. There is aortoiliac atherosclerotic disease. The abdominal aorta is tortuous. No portal venous gas identified. There is no adenopathy. There is osteopenia and degenerative changes of the spine. No acute fracture. IMPRESSION: Extensive colonic diverticulosis with active diverticular bleed in the proximal ascending colon. Electronically Signed   By: AAnner CreteM.D.   On: 09/12/2015 06:17    ROS Blood pressure 106/48, pulse 73, temperature 97.9 F (36.6 C), temperature source Oral, resp. rate 15, SpO2 96 %. Physical Exam Alert . Skin warm and dry. Oral mucosa is moist.   . Sclera anicteric, conjunctivae is pink. Thyroid not enlarged. No cervical lymphadenopathy. Lungs clear. Heart regular rate and rhythm.  Abdomen is soft. Bowel sounds are positive. No hepatomegaly. No abdominal masses felt. No tenderness.  No edema to lower extremities.  Dark blood-bright red blood noted in bedpan while I was in  room.   Assessment/Plan: Diverticular bleed. Son states he is willing for mother to have a colonoscopy if necessary. Dr. RLaural Goldenis aware.    SETZER,TERRI W 09/12/2015, 8:10 AM    GI attending:note. Patient interviewed and examined. Condition reviewed with patient's daughter SKatharine Lookand daughter-in-law THelene Kelpwho is at bedside. Ms. GPayton Mccallumis 82year old [place] who presents with acute onset of large-volume painless hematochezia. She is receiving first unit of PRBCs. Hemoglobin on admission was 11.6 and dropped to 9.0. She has not passed any blood per rectum in the last 4 hours. Her last colonoscopy was in October 2006 revealing pancolonic diverticulosis. Abdominal exam is benign other than hyperactive bowel sounds. Abdominopelvic CT reviewed with Dr. BThornton Papasand it appears to be contrasted terminal ileum and cecum and no evidence of bleed. Suspect colonic diverticular bleed. Will continue with supportive therapy and transfusion with PRBCs. If bleeding continues will consider diagnostic/therapeutic colonoscopy.

## 2015-09-12 NOTE — H&P (Signed)
[facility] History and Physical  Felicia Wells KXF:[id number] DOB: 1918-09-09    PCP:   Alonza Bogus, MD   Chief Complaint: lower GI Bleed.   HPI: Felicia Wells is an 82 y.o. female with DNR code status, hx of HTN, anxiety, assisted living resident, brought to the ER as she was having painless BRBPR.  She denied black stool, nausea, vomting fever or chills.  She has no lightheadedness of CP, SOB, or prior similar episode.  In the ER, she was alert, no sign of shock, and has Hb of 11.6 g per dL.  Her BUN was 20.  Hospitalist was asked to admit her for painless BRBPR.  I met her son, and he indicated that she a DNR, her husband is with hospice, and he would like to make sure his mother is comfortable.  EDP ordered abdominal Pelvic CT, and at this time, result is still pending.   Rewiew of Systems:  Constitutional: Negative for malaise, fever and chills. No significant weight loss or weight gain Eyes: Negative for eye pain, redness and discharge, diplopia, visual changes, or flashes of light. ENMT: Negative for ear pain, hoarseness, nasal congestion, sinus pressure and sore throat. No headaches; tinnitus, drooling, or problem swallowing. Cardiovascular: Negative for chest pain, palpitations, diaphoresis, dyspnea and peripheral edema. ; No orthopnea, PND Respiratory: Negative for cough, hemoptysis, wheezing and stridor. No pleuritic chestpain. Gastrointestinal: Negative for nausea, vomiting, diarrhea, constipation, abdominal pain, melena, blood in stool, hematemesis, jaundice and rectal bleeding.    Genitourinary: Negative for frequency, dysuria, incontinence,flank pain and hematuria; Musculoskeletal: Negative for back pain and neck pain. Negative for swelling and trauma.;  Skin: . Negative for pruritus, rash, abrasions, bruising and skin lesion.; ulcerations Neuro: Negative for headache, lightheadedness and neck stiffness. Negative for weakness, altered level of consciousness ,  altered mental status, extremity weakness, burning feet, involuntary movement, seizure and syncope.  Psych: negative for anxiety, depression, insomnia, tearfulness, panic attacks, hallucinations, paranoia, suicidal or homicidal ideation    Past Medical History  Diagnosis Date  . Infections of kidney   . Hypertension   . Osteoporosis   . Eye problem   . Anxiety     Past Surgical History  Procedure Laterality Date  . Eye surgery    . Tonsillectomy    . Thyroid surgery    . Dilation and curettage of uterus    . Brain shunt    . Hernia repair    . Cataract extraction    . Foot surgery      Medications:  HOME MEDS: Prior to Admission medications   Medication Sig Start Date End Date Taking? Authorizing Provider  aspirin EC 81 MG tablet Take 81 mg by mouth daily.   Yes Historical Provider, MD  cholecalciferol (VITAMIN D) 1000 UNITS tablet Take 500 Units by mouth 2 (two) times daily.   Yes Historical Provider, MD  hydrALAZINE (APRESOLINE) 10 MG tablet Take 10 mg by mouth 2 (two) times daily.   Yes Historical Provider, MD  hydrochlorothiazide (HYDRODIURIL) 25 MG tablet Take 25 mg by mouth daily.   Yes Historical Provider, MD  methylcellulose (ARTIFICIAL TEARS) 1 % ophthalmic solution Place 1 drop into both eyes 2 (two) times daily.   Yes Historical Provider, MD  Multiple Vitamin (DAILY VITE PO) Take 1 tablet by mouth daily.   Yes Historical Provider, MD  multivitamin-lutein (OCUVITE-LUTEIN) CAPS capsule Take 1 capsule by mouth daily.   Yes Historical Provider, MD  nebivolol (BYSTOLIC) 10 MG tablet Take 20  mg by mouth 2 (two) times daily.   Yes Historical Provider, MD  Omega 3 1000 MG CAPS Take 2,000 mg by mouth daily.   Yes Historical Provider, MD  potassium chloride (K-DUR,KLOR-CON) 10 MEQ tablet Take 10 mEq by mouth daily.   Yes Historical Provider, MD  telmisartan (MICARDIS) 80 MG tablet Take 80 mg by mouth daily.   Yes Historical Provider, MD  timolol (TIMOPTIC) 0.5 % ophthalmic  solution Place 2 drops into the left eye 2 (two) times daily.   Yes Historical Provider, MD  albuterol (PROVENTIL HFA;VENTOLIN HFA) 108 (90 BASE) MCG/ACT inhaler Inhale 1-2 puffs into the lungs every 6 (six) hours as needed for wheezing or shortness of breath. 09/13/13   Nat Christen, MD  guaiFENesin-dextromethorphan [facility] DM) 100-10 MG/5ML syrup Take 5 mLs by mouth every 4 (four) hours as needed for cough.    Historical Provider, MD  naproxen (NAPROSYN) 500 MG tablet Take 1 tablet (500 mg total) by mouth 2 (two) times daily with a meal. 10/21/13   Noemi Chapel, MD  potassium chloride (K-DUR) 10 MEQ tablet Take 1 tablet (10 mEq total) by mouth daily. 12/04/12   Sinda Du, MD     Allergies:  Allergies  Allergen Reactions  . Codeine Nausea Only  . Darvocet [Propoxyphene N-Acetaminophen] Nausea Only  . Demerol Nausea Only  . Morphine And Related     opiods  . Other Other (See Comments)    Sensitive to Cholesterol medications and blood pressure medications , possible fluid retention  . Sulfa Antibiotics Rash    Social History:   reports that she has never smoked. She has never used smokeless tobacco. She reports that she does not drink alcohol or use illicit drugs.  Family History: History reviewed. No pertinent family history.   Physical Exam: Filed Vitals:   09/12/15 0438 09/12/15 0536  BP: 145/79 142/76  Pulse: 72 68  Temp: 97.9 F (36.6 C)   TempSrc: Oral   Resp: 18 18  SpO2: 97% 96%   Blood pressure 142/76, pulse 68, temperature 97.9 F (36.6 C), temperature source Oral, resp. rate 18, SpO2 96 %.  GEN:  Pleasant  patient lying in the stretcher in no acute distress; cooperative with exam. PSYCH:  alert and oriented x4; does not appear anxious or depressed; affect is appropriate. HEENT: Mucous membranes pink and anicteric; PERRLA; EOM intact; no cervical lymphadenopathy nor thyromegaly or carotid bruit; no JVD; There were no stridor. Neck is very supple. Breasts:: Not  examined CHEST WALL: No tenderness CHEST: Normal respiration, clear to auscultation bilaterally.  HEART: Regular rate and rhythm.  There are no murmur, rub, or gallops.   BACK: No kyphosis or scoliosis; no CVA tenderness ABDOMEN: soft and non-tender; no masses, no organomegaly, normal abdominal bowel sounds; no pannus; no intertriginous candida. There is no rebound and no distention. Rectal Exam: Not done EXTREMITIES: No bone or joint deformity; age-appropriate arthropathy of the hands and knees; no edema; no ulcerations.  There is no calf tenderness. Genitalia: not examined PULSES: 2+ and symmetric SKIN: Normal hydration no rash or ulceration CNS: Cranial nerves 2-12 grossly intact no focal lateralizing neurologic deficit.  Speech is fluent; uvula elevated with phonation, facial symmetry and tongue midline. DTR are normal bilaterally, cerebella exam is intact, barbinski is negative and strengths are equaled bilaterally.  No sensory loss.   Labs on Admission:  Basic Metabolic Panel:  Recent Labs Lab 09/12/15 0450  NA 134*  K 3.9  CL 96*  CO2 32  GLUCOSE 110*  BUN 20  CREATININE 0.85  CALCIUM 9.1   Liver Function Tests:  Recent Labs Lab 09/12/15 0450  AST 28  ALT 20  ALKPHOS 61  BILITOT 0.5  PROT 6.3*  ALBUMIN 3.3*    Recent Labs Lab 09/12/15 0450  LIPASE 46   No results for input(s): AMMONIA in the last 168 hours. CBC:  Recent Labs Lab 09/12/15 0450  WBC 8.0  NEUTROABS 5.4  HGB 11.6*  HCT 35.9*  MCV 93.7  PLT 291    EKG: Independently reviewed.   Assessment/Plan Present on Admission:  . Hematochezia . Hypertension . DNR (do not resuscitate)  PLAN:  BRBPR:  Her son volunteered to me that he would like to have her mother kept comfortable.  If her bleeding stops, or if at all possible, he would like her to NOT having any endoscopy.  I offered to admit her and follow her Hct, and transfuse if necessary, and he agreed.  He clarified that she is a DNR.   EDP has consulted Dr Oneida Alar, and she is aware of her admission.  She could be having bleeding from malignancy, AVM, diverticular disease, or ischemic colitis.  Will give her clear liquid and IVF. Will type and screen and check H and H every 4 hours.   HTN:  Her meds will be held in this setting, until we are sure she no longer has further bleeding.   Other plans as per orders. Code Status: DNR.    Orvan Falconer, MD. FACP [facility] Pager [phone] 7pm to 7am.  09/12/2015, 6:01 AM

## 2015-09-12 NOTE — ED Notes (Signed)
Pt arrived by EMS from [facility] [facility]. Complaining of GI Bleed that started tonight.

## 2015-09-12 NOTE — ED Notes (Signed)
Attempted to call report to floor.  RN unable to take report.

## 2015-09-12 NOTE — ED Notes (Signed)
Pt passed fracture pan over flowing of dark red blood.  Dr Elesa MassedWard made aware.

## 2015-09-12 NOTE — Progress Notes (Signed)
Dr. Karilyn Cotaehman called and H&H lab results were relayed. Order received for 1 am recheck. Lab had been notified.

## 2015-09-12 NOTE — Progress Notes (Signed)
Subjective: She was admitted early this morning with GI bleeding. I discussed her situation with her son at bedside and although he does not want aggressive measures and he would agree to colonoscopy with the thought that she might have a bleeding site that could be treated. I think she is going to need a blood transfusion.  Objective: Vital signs in last 24 hours: Temp:  [97.5 F (36.4 C)-97.9 F (36.6 C)] 97.5 F (36.4 C) (01/11 0840) Pulse Rate:  [68-78] 78 (01/11 0840) Resp:  [15-22] 16 (01/11 0840) BP: (105-145)/(48-79) 110/72 mmHg (01/11 0840) SpO2:  [96 %-97 %] 97 % (01/11 0840) Weight:  [60.073 kg (132 lb 7 oz)] 60.073 kg (132 lb 7 oz) (01/11 0841) Weight change:  Last BM Date: 09/12/15  Intake/Output from previous day:    PHYSICAL EXAM General appearance: alert and Confused and very pale Resp: clear to auscultation bilaterally Cardio: regular rate and rhythm, S1, S2 normal, no murmur, click, rub or gallop GI: soft, non-tender; bowel sounds normal; no masses,  no organomegaly Extremities: extremities normal, atraumatic, no cyanosis or edema  Lab Results:  Results for orders placed or performed during the hospital encounter of 09/12/15 (from the past 48 hour(s))  Comprehensive metabolic panel     Status: Abnormal   Collection Time: 09/12/15  4:50 AM  Result Value Ref Range   Sodium 134 (L) 135 - 145 mmol/L   Potassium 3.9 3.5 - 5.1 mmol/L   Chloride 96 (L) 101 - 111 mmol/L   CO2 32 22 - 32 mmol/L   Glucose, Bld 110 (H) 65 - 99 mg/dL   BUN 20 6 - 20 mg/dL   Creatinine, Ser 0.85 0.44 - 1.00 mg/dL   Calcium 9.1 8.9 - 10.3 mg/dL   Total Protein 6.3 (L) 6.5 - 8.1 g/dL   Albumin 3.3 (L) 3.5 - 5.0 g/dL   AST 28 15 - 41 U/L   ALT 20 14 - 54 U/L   Alkaline Phosphatase 61 38 - 126 U/L   Total Bilirubin 0.5 0.3 - 1.2 mg/dL   GFR calc non Af Amer 57 (L) >60 mL/min   GFR calc Af Amer >60 >60 mL/min    Comment: (NOTE) The eGFR has been calculated using the CKD EPI  equation. This calculation has not been validated in all clinical situations. eGFR's persistently <60 mL/min signify possible Chronic Kidney Disease.    Anion gap 6 5 - 15  CBC WITH DIFFERENTIAL     Status: Abnormal   Collection Time: 09/12/15  4:50 AM  Result Value Ref Range   WBC 8.0 4.0 - 10.5 K/uL   RBC 3.83 (L) 3.87 - 5.11 MIL/uL   Hemoglobin 11.6 (L) 12.0 - 15.0 g/dL   HCT 35.9 (L) 36.0 - 46.0 %   MCV 93.7 78.0 - 100.0 fL   MCH 30.3 26.0 - 34.0 pg   MCHC 32.3 30.0 - 36.0 g/dL   RDW 14.1 11.5 - 15.5 %   Platelets 291 150 - 400 K/uL   Neutrophils Relative % 66 %   Neutro Abs 5.4 1.7 - 7.7 K/uL   Lymphocytes Relative 24 %   Lymphs Abs 1.9 0.7 - 4.0 K/uL   Monocytes Relative 7 %   Monocytes Absolute 0.6 0.1 - 1.0 K/uL   Eosinophils Relative 2 %   Eosinophils Absolute 0.1 0.0 - 0.7 K/uL   Basophils Relative 1 %   Basophils Absolute 0.1 0.0 - 0.1 K/uL  Lipase, blood     Status: None  Collection Time: 09/12/15  4:50 AM  Result Value Ref Range   Lipase 46 11 - 51 U/L  APTT     Status: None   Collection Time: 09/12/15  4:50 AM  Result Value Ref Range   aPTT 29 24 - 37 seconds  Protime-INR     Status: Abnormal   Collection Time: 09/12/15  4:50 AM  Result Value Ref Range   Prothrombin Time 15.4 (H) 11.6 - 15.2 seconds   INR 1.20 0.00 - 1.49  Type and screen [facility]     Status: None (Preliminary result)   Collection Time: 09/12/15  4:50 AM  Result Value Ref Range   ABO/RH(D) O POS    Antibody Screen NEG    Sample Expiration 09/15/2015    Unit Number [id number]    Blood Component Type RED CELLS,LR    Unit division 00    Status of Unit ALLOCATED    Transfusion Status OK TO TRANSFUSE    Crossmatch Result Compatible    Unit Number [id number]    Blood Component Type RED CELLS,LR    Unit division 00    Status of Unit ALLOCATED    Transfusion Status OK TO TRANSFUSE    Crossmatch Result Compatible   Lactic acid, plasma     Status: None   Collection  Time: 09/12/15  4:50 AM  Result Value Ref Range   Lactic Acid, Venous 0.8 0.5 - 2.0 mmol/L  Prepare RBC     Status: None   Collection Time: 09/12/15  4:50 AM  Result Value Ref Range   Order Confirmation ORDER PROCESSED BY BLOOD BANK   ABO/Rh     Status: None   Collection Time: 09/12/15  4:50 AM  Result Value Ref Range   ABO/RH(D) O POS     ABGS No results for input(s): PHART, PO2ART, TCO2, HCO3 in the last 72 hours.  Invalid input(s): PCO2 CULTURES No results found for this or any previous visit (from the past 240 hour(s)). Studies/Results: Ct Abdomen Pelvis W Contrast  09/12/2015  CLINICAL DATA:  82 year old female with abdominal pain and GI bleed. EXAM: CT ABDOMEN AND PELVIS WITH CONTRAST TECHNIQUE: Multidetector CT imaging of the abdomen and pelvis was performed using the standard protocol following bolus administration of intravenous contrast. CONTRAST:  175m OMNIPAQUE IOHEXOL 300 MG/ML  SOLN COMPARISON:  CT dated 07/18/2012 FINDINGS: Cardiomegaly. The visualized lung bases are clear. No intra-abdominal free air or free fluid. A 1 cm right hepatic hypodense lesion is not well characterized. The gallbladder, pancreas, spleen, and adrenal glands appear unremarkable. Right renal cortical irregularity likely related to prior infarct and scarring. There is no hydronephrosis on either side. The visualized ureters appear unremarkable. There is apparent diffuse thickening of the bladder wall which may be partly related to underdistention. Cystitis is not excluded. Correlation with urinalysis recommended. The uterus is anteverted. A device in the lower uterus may represent a pessary. There is extensive sigmoid and colonic diverticulosis without active inflammatory changes. There is high attenuating material extending from a diverticulum in the proximal ascending colon compatible with diverticular bleed. Loose stool noted within the colon compatible with diarrheal state. There is no evidence of bowel  obstruction. Normal appendix. There is aortoiliac atherosclerotic disease. The abdominal aorta is tortuous. No portal venous gas identified. There is no adenopathy. There is osteopenia and degenerative changes of the spine. No acute fracture. IMPRESSION: Extensive colonic diverticulosis with active diverticular bleed in the proximal ascending colon. Electronically Signed   By: AMilas Hock  Radparvar M.D.   On: 09/12/2015 06:17    Medications:  Prior to Admission:  Prescriptions prior to admission  Medication Sig Dispense Refill Last Dose  . Artificial Tear Solution (SOOTHE XP OP) Apply 2 drops to eye 4 (four) times daily. Instil one drop in each eye   09/11/2015 at Unknown time  . aspirin EC 81 MG tablet Take 81 mg by mouth daily.   09/11/2015 at Unknown time  . cholecalciferol (VITAMIN D) 1000 UNITS tablet Take 500 Units by mouth 2 (two) times daily.   09/11/2015 at Unknown time  . hydrALAZINE (APRESOLINE) 10 MG tablet Take 10 mg by mouth 2 (two) times daily.   09/11/2015 at Unknown time  . hydrochlorothiazide (HYDRODIURIL) 25 MG tablet Take 25 mg by mouth daily.   09/11/2015 at Unknown time  . methylcellulose (ARTIFICIAL TEARS) 1 % ophthalmic solution Place 1 drop into both eyes 2 (two) times daily.   10/20/2013 at Unknown time  . Multiple Vitamin (DAILY VITE PO) Take 1 tablet by mouth daily.   10/20/2013 at Unknown time  . multivitamin-lutein (OCUVITE-LUTEIN) CAPS capsule Take 1 capsule by mouth daily.     . Nebivolol HCl (BYSTOLIC) 20 MG TABS Take 20 mg by mouth 2 (two) times daily.   09/11/2015 at 1700  . Omega 3 1000 MG CAPS Take 2,000 mg by mouth daily.   10/20/2013 at Unknown time  . potassium chloride (K-DUR) 10 MEQ tablet Take 1 tablet (10 mEq total) by mouth daily. 30 tablet 12 09/11/2015 at Unknown time  . potassium chloride (K-DUR,KLOR-CON) 10 MEQ tablet Take 10 mEq by mouth daily.     Marland Kitchen telmisartan (MICARDIS) 80 MG tablet Take 80 mg by mouth daily.   10/20/2013 at Unknown time  . timolol (TIMOPTIC)  0.5 % ophthalmic solution Place 2 drops into the left eye 2 (two) times daily.   10/20/2013 at Unknown time  . albuterol (PROVENTIL HFA;VENTOLIN HFA) 108 (90 BASE) MCG/ACT inhaler Inhale 1-2 puffs into the lungs every 6 (six) hours as needed for wheezing or shortness of breath. 1 Inhaler 0 unknown  . guaiFENesin-dextromethorphan (ROBITUSSIN DM) 100-10 MG/5ML syrup Take 5 mLs by mouth every 4 (four) hours as needed for cough.   unknown  . naproxen (NAPROSYN) 500 MG tablet Take 1 tablet (500 mg total) by mouth 2 (two) times daily with a meal. 30 tablet 0    Scheduled: . sodium chloride  10 mL/hr Intravenous Once  . cholecalciferol  500 Units Oral BID  . diatrizoate meglumine-sodium      . polyvinyl alcohol  1 drop Both Eyes BID  . sodium chloride  3 mL Intravenous Q12H  . timolol  2 drop Left Eye BID   Continuous: . dextrose 5 % and 0.9 % NaCl with KCl 20 mEq/L 1,000 mL (09/12/15 0835)   [id number]  Assesment: She has GI bleeding with hematochezia. At baseline she has dementia which is severe. Her family requests that she have care that is biased toward comfort care but do agree with colonoscopy to see if she has a bleeding lesion that could be treated if that's indicated. She is going to need some blood. Principal Problem:   Hypertension Active Problems:   Hematochezia   DNR (do not resuscitate)   Bleeding gastrointestinal    Plan: As above. She may need hospice care at discharge    LOS: 0 days   Voris Tigert L 09/12/2015, 9:59 AM

## 2015-09-13 LAB — CBC
HEMATOCRIT: 28.3 % — AB (ref 36.0–46.0)
HEMATOCRIT: 29.6 % — AB (ref 36.0–46.0)
Hemoglobin: 9.3 g/dL — ABNORMAL LOW (ref 12.0–15.0)
Hemoglobin: 9.8 g/dL — ABNORMAL LOW (ref 12.0–15.0)
MCH: 29.3 pg (ref 26.0–34.0)
MCH: 30.1 pg (ref 26.0–34.0)
MCHC: 32.9 g/dL (ref 30.0–36.0)
MCHC: 33.1 g/dL (ref 30.0–36.0)
MCV: 90.8 fL (ref 78.0–100.0)
MCV: 89.3 fL (ref 78.0–100.0)
PLATELETS: 207 10*3/uL (ref 150–400)
Platelets: 194 10*3/uL (ref 150–400)
RBC: 3.26 MIL/uL — ABNORMAL LOW (ref 3.87–5.11)
RBC: 3.17 MIL/uL — AB (ref 3.87–5.11)
RDW: 16.8 % — AB (ref 11.5–15.5)
RDW: 17.1 % — AB (ref 11.5–15.5)
WBC: 12.6 10*3/uL — AB (ref 4.0–10.5)
WBC: 10 10*3/uL (ref 4.0–10.5)

## 2015-09-13 LAB — TYPE AND SCREEN
ABO/RH(D): O POS
Antibody Screen: NEGATIVE
Unit division: 0
Unit division: 0

## 2015-09-13 NOTE — Progress Notes (Signed)
Patient ID: Sabino GasserVirginia P Vanpelt, female   DOB: [birth year], 82 y.o.   MRN: [id number] Patient states she feels better than yesterday. She does have a little confusion this am. Foley in place. She did not have a stool last night. She has received 2 units of PRBCs. She has maintained her hemoglobin.  CBC Latest Ref Rng 09/13/2015 09/13/2015 09/12/2015  WBC 4.0 - 10.5 K/uL 10.0 12.6(H) 11.1(H)  Hemoglobin 12.0 - 15.0 g/dL 4.0(J9.8(L) 8.1(X9.3(L) 11.0(L)  Hematocrit 36.0 - 46.0 % 29.6(L) 28.3(L) 32.4(L)  Platelets 150 - 400 K/uL 207 194 228  I/O last 3 completed shifts: In: 1300 [P.O.:600; Blood:700] Out: 1450 [Urine:1450]   Assessment: Diverticular bleed. She is maintaining her hemoglobin. Will continue to monitor.

## 2015-09-13 NOTE — Clinical Social Work Note (Signed)
Clinical Social Work Assessment  Patient Details  Name: Felicia Wells MRN: [id number] Date of Birth: May 17, 1919  Date of referral:  09/13/15               Reason for consult:  Facility Placement                Permission sought to share information with:    Permission granted to share information::     Name::        Agency::     Relationship::     Contact Information:     Housing/Transportation Living arrangements for the past 2 months:  [place] of Information:  Patient, Adult Children (Daughter, [name]) Patient Interpreter Needed:  None Criminal Activity/Legal Involvement Pertinent to Current Situation/Hospitalization:  No - Comment as needed Significant Relationships:  Adult Children, Spouse, Other Family Members Lives with:  Facility Resident Do you feel safe going back to the place where you live?  Yes Need for family participation in patient care:  Yes (Comment)  Care giving concerns:  None identified.    Social Worker assessment / plan:  CSW met with patient, her daughter, [name], was at bedside.  CSW was advised that patient was as been at [facility] for almost [facility] years.  Patient stated she was "well pleased" with the care she receives at [facility].  Ms. Wynelle Cleveland indicated that at baseline patient is independent with her ADL.  She stated that patient's son visits daily.  Ms. Wynelle Cleveland stated that patient's husband of 35 years is also in the facility under Hospice care.  Patient and daughter agreed that patient could return to the facility at discharge.  CSW spoke with Levada Dy at [facility] who confirmed patient and Ms. Page's statements. She advised that patient can return to the facility.    Employment status:  Retired Nurse, adult PT Recommendations:  Not assessed at this time Information / Referral to community resources:     Patient/Family's Response to care:  Patient/family agreeable for patient to  return to facility at discharge.   Patient/Family's Understanding of and Emotional Response to Diagnosis, Current Treatment, and Prognosis: Patient and family agree that they understand patient's diagnosis, treatment and prognosis.    Emotional Assessment Appearance:  Appears stated age Attitude/Demeanor/Rapport:   (Cooperative, Pleasant) Affect (typically observed):  Calm, Happy Orientation:  Oriented to Self, Oriented to Place, Oriented to Situation Alcohol / Substance use:  Not Applicable Psych involvement (Current and /or in the community):  No (Comment)  Discharge Needs  Concerns to be addressed:  Discharge Planning Concerns Readmission within the last 30 days:  No Current discharge risk:  None Barriers to Discharge:  No Barriers Identified   Ihor Gully, LCSW 09/13/2015, 11:41 AM [phone]

## 2015-09-13 NOTE — Care Management Note (Signed)
Case Management Note  Patient Details  Name: Felicia GasserVirginia P Wells MRN: [id number] Date of Birth: [birth year]  Subjective/Objective:                  [facility]. Anticipate discharge back to facility when medically stable.  Action/Plan: CSW is aware and will arrange discharge to facility when medically stable.  Expected Discharge Date:                  Expected Discharge Plan:  Assisted Living / Rest Home  In-House Referral:  Clinical Social Work  Discharge planning Services  CM Consult  Post Acute Care Choice:  NA Choice offered to:  NA  DME Arranged:    DME Agency:     HH Arranged:    HH Agency:     Status of Service:  Completed, signed off  Medicare Important Message Given:    Date Medicare IM Given:    Medicare IM give by:    Date Additional Medicare IM Given:    Additional Medicare Important Message give by:     If discussed at Long Length of Stay Meetings, dates discussed:    Additional Comments:  Cheryl FlashBlackwell, Tatsuya Okray Crowder, RN 09/13/2015, 9:51 AM

## 2015-09-13 NOTE — Progress Notes (Signed)
Subjective: She has improved. No pain. She says she is hungry.  Objective: Vital signs in last 24 hours: Temp:  [97.5 F (36.4 C)-98.5 F (36.9 C)] 98.5 F (36.9 C) (01/12 0513) Pulse Rate:  [68-84] 68 (01/12 0513) Resp:  [16-20] 20 (01/12 0513) BP: (102-126)/(42-70) 108/42 mmHg (01/12 0513) SpO2:  [92 %-97 %] 95 % (01/12 0513) Weight change:  Last BM Date: 09/12/15  Intake/Output from previous day: 01/11 0701 - 01/12 0700 In: 1300 [P.O.:600; Blood:700] Out: 1450 [Urine:1450]  PHYSICAL EXAM General appearance: alert and confused Resp: clear to auscultation bilaterally Cardio: regular rate and rhythm, S1, S2 normal, no murmur, click, rub or gallop GI: soft, non-tender; bowel sounds normal; no masses,  no organomegaly Extremities: extremities normal, atraumatic, no cyanosis or edema  Lab Results:  Results for orders placed or performed during the hospital encounter of 09/12/15 (from the past 48 hour(s))  Comprehensive metabolic panel     Status: Abnormal   Collection Time: 09/12/15  4:50 AM  Result Value Ref Range   Sodium 134 (L) 135 - 145 mmol/L   Potassium 3.9 3.5 - 5.1 mmol/L   Chloride 96 (L) 101 - 111 mmol/L   CO2 32 22 - 32 mmol/L   Glucose, Bld 110 (H) 65 - 99 mg/dL   BUN 20 6 - 20 mg/dL   Creatinine, Ser 0.85 0.44 - 1.00 mg/dL   Calcium 9.1 8.9 - 10.3 mg/dL   Total Protein 6.3 (L) 6.5 - 8.1 g/dL   Albumin 3.3 (L) 3.5 - 5.0 g/dL   AST 28 15 - 41 U/L   ALT 20 14 - 54 U/L   Alkaline Phosphatase 61 38 - 126 U/L   Total Bilirubin 0.5 0.3 - 1.2 mg/dL   GFR calc non Af Amer 57 (L) >60 mL/min   GFR calc Af Amer >60 >60 mL/min    Comment: (NOTE) The eGFR has been calculated using the CKD EPI equation. This calculation has not been validated in all clinical situations. eGFR's persistently <60 mL/min signify possible Chronic Kidney Disease.    Anion gap 6 5 - 15  CBC WITH DIFFERENTIAL     Status: Abnormal   Collection Time: 09/12/15  4:50 AM  Result Value Ref  Range   WBC 8.0 4.0 - 10.5 K/uL   RBC 3.83 (L) 3.87 - 5.11 MIL/uL   Hemoglobin 11.6 (L) 12.0 - 15.0 g/dL   HCT 35.9 (L) 36.0 - 46.0 %   MCV 93.7 78.0 - 100.0 fL   MCH 30.3 26.0 - 34.0 pg   MCHC 32.3 30.0 - 36.0 g/dL   RDW 14.1 11.5 - 15.5 %   Platelets 291 150 - 400 K/uL   Neutrophils Relative % 66 %   Neutro Abs 5.4 1.7 - 7.7 K/uL   Lymphocytes Relative 24 %   Lymphs Abs 1.9 0.7 - 4.0 K/uL   Monocytes Relative 7 %   Monocytes Absolute 0.6 0.1 - 1.0 K/uL   Eosinophils Relative 2 %   Eosinophils Absolute 0.1 0.0 - 0.7 K/uL   Basophils Relative 1 %   Basophils Absolute 0.1 0.0 - 0.1 K/uL  Lipase, blood     Status: None   Collection Time: 09/12/15  4:50 AM  Result Value Ref Range   Lipase 46 11 - 51 U/L  APTT     Status: None   Collection Time: 09/12/15  4:50 AM  Result Value Ref Range   aPTT 29 24 - 37 seconds  Protime-INR  Status: Abnormal   Collection Time: 09/12/15  4:50 AM  Result Value Ref Range   Prothrombin Time 15.4 (H) 11.6 - 15.2 seconds   INR 1.20 0.00 - 1.49  Type and screen [facility]     Status: None   Collection Time: 09/12/15  4:50 AM  Result Value Ref Range   ABO/RH(D) O POS    Antibody Screen NEG    Sample Expiration 09/15/2015    Unit Number [id number]    Blood Component Type RED CELLS,LR    Unit division 00    Status of Unit ISSUED,FINAL    Transfusion Status OK TO TRANSFUSE    Crossmatch Result Compatible    Unit Number [id number]    Blood Component Type RED CELLS,LR    Unit division 00    Status of Unit ISSUED,FINAL    Transfusion Status OK TO TRANSFUSE    Crossmatch Result Compatible   Lactic acid, plasma     Status: None   Collection Time: 09/12/15  4:50 AM  Result Value Ref Range   Lactic Acid, Venous 0.8 0.5 - 2.0 mmol/L  Prepare RBC     Status: None   Collection Time: 09/12/15  4:50 AM  Result Value Ref Range   Order Confirmation ORDER PROCESSED BY BLOOD BANK   ABO/Rh     Status: None   Collection Time: 09/12/15   4:50 AM  Result Value Ref Range   ABO/RH(D) O POS   CBC     Status: Abnormal   Collection Time: 09/12/15  9:00 AM  Result Value Ref Range   WBC 12.1 (H) 4.0 - 10.5 K/uL   RBC 2.87 (L) 3.87 - 5.11 MIL/uL   Hemoglobin 9.0 (L) 12.0 - 15.0 g/dL    Comment: DELTA CHECK NOTED   HCT 26.8 (L) 36.0 - 46.0 %   MCV 93.4 78.0 - 100.0 fL   MCH 31.4 26.0 - 34.0 pg   MCHC 33.6 30.0 - 36.0 g/dL   RDW 14.0 11.5 - 15.5 %   Platelets 262 150 - 400 K/uL  CBC     Status: Abnormal   Collection Time: 09/12/15  2:27 PM  Result Value Ref Range   WBC 9.7 4.0 - 10.5 K/uL   RBC 2.99 (L) 3.87 - 5.11 MIL/uL   Hemoglobin 9.3 (L) 12.0 - 15.0 g/dL   HCT 27.6 (L) 36.0 - 46.0 %   MCV 92.3 78.0 - 100.0 fL   MCH 31.1 26.0 - 34.0 pg   MCHC 33.7 30.0 - 36.0 g/dL   RDW 13.8 11.5 - 15.5 %   Platelets 215 150 - 400 K/uL  CBC     Status: Abnormal   Collection Time: 09/12/15  6:18 PM  Result Value Ref Range   WBC 11.1 (H) 4.0 - 10.5 K/uL   RBC 3.66 (L) 3.87 - 5.11 MIL/uL   Hemoglobin 11.0 (L) 12.0 - 15.0 g/dL   HCT 32.4 (L) 36.0 - 46.0 %   MCV 88.5 78.0 - 100.0 fL   MCH 30.1 26.0 - 34.0 pg   MCHC 34.0 30.0 - 36.0 g/dL   RDW 16.1 (H) 11.5 - 15.5 %   Platelets 228 150 - 400 K/uL  CBC     Status: Abnormal   Collection Time: 09/13/15 12:50 AM  Result Value Ref Range   WBC 12.6 (H) 4.0 - 10.5 K/uL   RBC 3.17 (L) 3.87 - 5.11 MIL/uL   Hemoglobin 9.3 (L) 12.0 - 15.0 g/dL   HCT 28.3 (  L) 36.0 - 46.0 %   MCV 89.3 78.0 - 100.0 fL   MCH 29.3 26.0 - 34.0 pg   MCHC 32.9 30.0 - 36.0 g/dL   RDW 16.8 (H) 11.5 - 15.5 %   Platelets 194 150 - 400 K/uL  CBC     Status: Abnormal   Collection Time: 09/13/15  6:35 AM  Result Value Ref Range   WBC 10.0 4.0 - 10.5 K/uL   RBC 3.26 (L) 3.87 - 5.11 MIL/uL   Hemoglobin 9.8 (L) 12.0 - 15.0 g/dL   HCT 29.6 (L) 36.0 - 46.0 %   MCV 90.8 78.0 - 100.0 fL   MCH 30.1 26.0 - 34.0 pg   MCHC 33.1 30.0 - 36.0 g/dL   RDW 17.1 (H) 11.5 - 15.5 %   Platelets 207 150 - 400 K/uL    ABGS No  results for input(s): PHART, PO2ART, TCO2, HCO3 in the last 72 hours.  Invalid input(s): PCO2 CULTURES No results found for this or any previous visit (from the past 240 hour(s)). Studies/Results: Ct Abdomen Pelvis W Contrast  09/12/2015  CLINICAL DATA:  82 year old female with abdominal pain and GI bleed. EXAM: CT ABDOMEN AND PELVIS WITH CONTRAST TECHNIQUE: Multidetector CT imaging of the abdomen and pelvis was performed using the standard protocol following bolus administration of intravenous contrast. CONTRAST:  125m OMNIPAQUE IOHEXOL 300 MG/ML  SOLN COMPARISON:  CT dated 07/18/2012 FINDINGS: Cardiomegaly. The visualized lung bases are clear. No intra-abdominal free air or free fluid. A 1 cm right hepatic hypodense lesion is not well characterized. The gallbladder, pancreas, spleen, and adrenal glands appear unremarkable. Right renal cortical irregularity likely related to prior infarct and scarring. There is no hydronephrosis on either side. The visualized ureters appear unremarkable. There is apparent diffuse thickening of the bladder wall which may be partly related to underdistention. Cystitis is not excluded. Correlation with urinalysis recommended. The uterus is anteverted. A device in the lower uterus may represent a pessary. There is extensive sigmoid and colonic diverticulosis without active inflammatory changes. There is high attenuating material extending from a diverticulum in the proximal ascending colon compatible with diverticular bleed. Loose stool noted within the colon compatible with diarrheal state. There is no evidence of bowel obstruction. Normal appendix. There is aortoiliac atherosclerotic disease. The abdominal aorta is tortuous. No portal venous gas identified. There is no adenopathy. There is osteopenia and degenerative changes of the spine. No acute fracture. IMPRESSION: Extensive colonic diverticulosis with active diverticular bleed in the proximal ascending colon.  Electronically Signed   By: AAnner CreteM.D.   On: 09/12/2015 06:17    Medications:  Prior to Admission:  Prescriptions prior to admission  Medication Sig Dispense Refill Last Dose  . albuterol (PROVENTIL HFA;VENTOLIN HFA) 108 (90 BASE) MCG/ACT inhaler Inhale 1-2 puffs into the lungs every 6 (six) hours as needed for wheezing or shortness of breath. 1 Inhaler 0 unknown  . Artificial Tear Solution (SOOTHE XP OP) Apply 2 drops to eye 4 (four) times daily. Instil one drop in each eye   09/11/2015 at Unknown time  . aspirin EC 81 MG tablet Take 81 mg by mouth daily.   09/11/2015 at Unknown time  . cholecalciferol (VITAMIN D) 1000 UNITS tablet Take 500 Units by mouth 2 (two) times daily.   09/11/2015 at Unknown time  . diazepam (VALIUM) 2 MG tablet Take 2 mg by mouth 2 (two) times daily as needed (meniere's disease).    unknown  . guaiFENesin-dextromethorphan (ROBITUSSIN DM) 100-10 MG/5ML  syrup Take 5 mLs by mouth every 4 (four) hours as needed for cough.   09/04/2015  . hydrALAZINE (APRESOLINE) 10 MG tablet Take 10 mg by mouth 2 (two) times daily.   09/11/2015 at Unknown time  . hydrochlorothiazide (HYDRODIURIL) 25 MG tablet Take 25 mg by mouth daily.   09/11/2015 at Unknown time  . LORazepam (ATIVAN) 0.5 MG tablet Take 1 tablet by mouth 3 (three) times daily as needed (anxiety/agitation).    09/09/2015  . Nebivolol HCl (BYSTOLIC) 20 MG TABS Take 20 mg by mouth 2 (two) times daily.   09/11/2015 at 1700  . ondansetron (ZOFRAN) 4 MG tablet Take 4 mg by mouth 4 (four) times daily as needed for nausea or vomiting.    Unknown  . potassium chloride (K-DUR) 10 MEQ tablet Take 1 tablet (10 mEq total) by mouth daily. 30 tablet 12 09/11/2015 at Unknown time   Scheduled: . sodium chloride  10 mL/hr Intravenous Once  . cholecalciferol  500 Units Oral BID  . famotidine  20 mg Oral BID  . polyvinyl alcohol  1 drop Both Eyes BID  . sodium chloride  3 mL Intravenous Q12H   Continuous: . dextrose 5 % and 0.9 % NaCl  with KCl 20 mEq/L 1,000 mL (09/12/15 0835)   EEA:[id number], diazepam, fentaNYL (SUBLIMAZE) injection, ondansetron (ZOFRAN) IV  Assesment:she has had GI bleed that seems to have resolved.  She is hungry. Hemoglobin has stabilized. Discussed with her son and Dr. Laural Golden.  Principal Problem:   Hypertension Active Problems:   Hematochezia   DNR (do not resuscitate)   Bleeding gastrointestinal    Plan:advance diet. Watch hemoglobin. Palliative care consult possibly back to ALF tomorrow    LOS: 1 day   Felicia Wells L 09/13/2015, 9:30 AM

## 2015-09-13 NOTE — Clinical Documentation Improvement (Signed)
Internal Medicine  Abnormal Lab/Test Results:  Hemoglobin 12.0 - 15.0 g/dL 9.8 (L) 9.3 (L) 25.311.0 (L) 9.3 (L) 9.0 (L)CM 11.6 (L         HCT 36.0 - 46.0 % 29.6 (L) 28.3 (L) 32.4 (L) 27.6 (L) 26.8 (L) 35.9          Possible Clinical Conditions associated with below indicators     Blood loss anemia, acute    Blood loss anemia, chronic    Chronic anemia    Iron deficiency anemia    Macrocytic anemia    Microcytic anemia    Normocytic anemia    Other Condition  Cannot Clinically Determine   Supporting Information:  Repeated CBC    Please exercise your independent, professional judgment when responding. A specific answer is not anticipated or expected.   Thank You,  Lavonda JumboLawanda J Janne Faulk [employer] [facility] [place] [phone]

## 2015-09-13 NOTE — Progress Notes (Signed)
Dr. Karilyn Cotaehman had been called and notified of recent H&H results. No need to transfuse at this time. Next CBC @ 5 am.

## 2015-09-14 ENCOUNTER — Encounter (HOSPITAL_COMMUNITY): Payer: Self-pay | Admitting: Primary Care

## 2015-09-14 ENCOUNTER — Inpatient Hospital Stay
Admission: RE | Admit: 2015-09-14 | Discharge: 2015-09-28 | Disposition: A | Discharge status: Home / Self Care | Payer: Medicare Other | Source: Ambulatory Visit | Attending: Pulmonary Disease | Admitting: Pulmonary Disease

## 2015-09-14 DIAGNOSIS — K922 Gastrointestinal hemorrhage, unspecified: Secondary | ICD-10-CM

## 2015-09-14 DIAGNOSIS — K5731 Diverticulosis of large intestine without perforation or abscess with bleeding: Principal | ICD-10-CM

## 2015-09-14 DIAGNOSIS — Z515 Encounter for palliative care: Secondary | ICD-10-CM | POA: Insufficient documentation

## 2015-09-14 DIAGNOSIS — Z66 Do not resuscitate: Secondary | ICD-10-CM

## 2015-09-14 LAB — HEMOGLOBIN AND HEMATOCRIT, BLOOD
HEMATOCRIT: 31.1 % — AB (ref 36.0–46.0)
HEMOGLOBIN: 10.1 g/dL — AB (ref 12.0–15.0)

## 2015-09-14 MED ORDER — FAMOTIDINE 20 MG PO TABS: 20.0000 mg | ORAL_TABLET | Freq: Two times a day (BID) | ORAL | Status: DC

## 2015-09-14 NOTE — NC FL2 (Signed)
[place] MEDICAID FL2 LEVEL OF CARE SCREENING TOOL     IDENTIFICATION  Patient Name: Felicia Wells Birthdate: [birth year] Sex: female Admission Date (Current Location): 09/12/2015  [facility] and Felicia Wells Number:  [employer] and Address:  [facility],  [address] [place] [address]      Provider Number: [id number]  Attending Physician Name and Address:  Kari Baars, MD  Relative Name and Phone Number:       Current Level of Care: Hospital Recommended Level of Care: Skilled Nursing Facility Prior Approval Number:    Date Approved/Denied:   PASRR Number:  [id number])  Discharge Plan: SNF    Current Diagnoses: Patient Active Problem List   Diagnosis Date Noted  . Hematochezia 09/12/2015  . DNR (do not resuscitate) 09/12/2015  . Bleeding gastrointestinal   . CAP (community acquired pneumonia) 12/04/2012  . Hyponatremia 12/04/2012  . Muscular deconditioning 12/04/2012  . Acute delirium 12/04/2012  . Fracture of acetabulum (HCC) 07/19/2012  . Hypertension 07/19/2012  . Meniere's disease 07/19/2012  . Muscle weakness (generalized) 06/03/2011  . Difficulty in walking(719.7) 06/03/2011    Orientation RESPIRATION BLADDER Height & Weight    Self, Situation, Place  Normal Continent 5\' 5"  (165.1 cm) 132 lbs.  BEHAVIORAL SYMPTOMS/MOOD NEUROLOGICAL BOWEL NUTRITION STATUS      Continent  (Diet: Heart Healthy)  AMBULATORY STATUS COMMUNICATION OF NEEDS Skin   Limited Assist Verbally Normal                       Personal Care Assistance Level of Assistance  Bathing, Dressing Bathing Assistance: Limited assistance   Dressing Assistance: Limited assistance     Functional Limitations Info  Sight, Speech, Hearing Sight Info: Adequate Hearing Info: Adequate Speech Info: Adequate    SPECIAL CARE FACTORS FREQUENCY  PT (By licensed PT)     PT Frequency:  (5x/week)              Contractures      Additional Factors Info   Psychotropic Code Status Info:  (DNR) Allergies Info:  (Patient has multiple allergies, please see H&P) Psychotropic Info:  (Valium)         Current Medications (09/14/2015):  This is the current hospital active medication list Current Facility-Administered Medications  Medication Dose Route Frequency Provider Last Rate Last Dose  . 0.9 %  sodium chloride infusion  10 mL/hr Intravenous Once Kristen N Ward, DO      . albuterol (PROVENTIL) (2.5 MG/3ML) 0.083% nebulizer solution 2.5 mg  2.5 mg Nebulization Q6H PRN Kari Baars, MD      . cholecalciferol (VITAMIN Wells) tablet 500 Units  500 Units Oral BID Houston Siren, MD   500 Units at 09/13/15 2229  . dextrose 5 % and 0.9 % NaCl with KCl 20 mEq/L infusion   Intravenous Continuous Houston Siren, MD 100 mL/hr at 09/14/15 0359    . diazepam (VALIUM) tablet 2 mg  2 mg Oral Q12H PRN Kari Baars, MD   2 mg at 09/12/15 1823  . famotidine (PEPCID) tablet 20 mg  20 mg Oral BID Malissa Hippo, MD   20 mg at 09/13/15 2229  . fentaNYL (SUBLIMAZE) injection 12.5 mcg  12.5 mcg Intravenous Q2H PRN Kari Baars, MD      . ondansetron [facility]) injection 4 mg  4 mg Intravenous Q6H PRN Malissa Hippo, MD   4 mg at 09/12/15 1833  . polyvinyl alcohol (LIQUIFILM TEARS) 1.4 % ophthalmic solution 1  drop  1 drop Both Eyes BID Houston SirenPeter Le, MD   1 drop at 09/13/15 2236  . sodium chloride 0.9 % injection 3 mL  3 mL Intravenous Q12H Houston SirenPeter Le, MD   3 mL at 09/13/15 2237     Discharge Medications: Please see discharge summary for a list of discharge medications.  Relevant Imaging Results:  Relevant Lab Results:   Additional Information    Felicia Wells, Felicia ChinaHeather D, LCSW

## 2015-09-14 NOTE — Discharge Summary (Signed)
Physician Discharge Summary  Patient ID: Felicia GasserVirginia P Grewe MRN: [id number] DOB/AGE: 30-Oct-1918 82 y.o. Primary Care Physician:HAWKINS,EDWARD L, MD Admit date: 09/12/2015 Discharge date: 09/14/2015    Discharge Diagnoses:   Principal Problem:   Hypertension Active Problems:   Hematochezia   DNR (do not resuscitate)   Bleeding gastrointestinal     Medication List    STOP taking these medications        aspirin EC 81 MG tablet      TAKE these medications        albuterol 108 (90 Base) MCG/ACT inhaler  Commonly known as:  PROVENTIL HFA;VENTOLIN HFA  Inhale 1-2 puffs into the lungs every 6 (six) hours as needed for wheezing or shortness of breath.     BYSTOLIC 20 MG Tabs  Generic drug:  Nebivolol HCl  Take 20 mg by mouth 2 (two) times daily.     cholecalciferol 1000 units tablet  Commonly known as:  VITAMIN D  Take 500 Units by mouth 2 (two) times daily.     diazepam 2 MG tablet  Commonly known as:  VALIUM  Take 2 mg by mouth 2 (two) times daily as needed (meniere's disease).     famotidine 20 MG tablet  Commonly known as:  PEPCID  Take 1 tablet (20 mg total) by mouth 2 (two) times daily.     guaiFENesin-dextromethorphan 100-10 MG/5ML syrup  Commonly known as:  ROBITUSSIN DM  Take 5 mLs by mouth every 4 (four) hours as needed for cough.     hydrALAZINE 10 MG tablet  Commonly known as:  APRESOLINE  Take 10 mg by mouth 2 (two) times daily.     hydrochlorothiazide 25 MG tablet  Commonly known as:  HYDRODIURIL  Take 25 mg by mouth daily.     LORazepam 0.5 MG tablet  Commonly known as:  ATIVAN  Take 1 tablet by mouth 3 (three) times daily as needed (anxiety/agitation).     ondansetron 4 MG tablet  Commonly known as:  ZOFRAN  Take 4 mg by mouth 4 (four) times daily as needed for nausea or vomiting.     potassium chloride 10 MEQ tablet  Commonly known as:  K-DUR  Take 1 tablet (10 mEq total) by mouth daily.     SOOTHE XP OP  Apply 2 drops to eye 4 (four)  times daily. Instil one drop in each eye        Discharged Condition: improved    Consults: GI  Significant Diagnostic Studies: Ct Abdomen Pelvis W Contrast  09/12/2015  CLINICAL DATA:  82 year old female with abdominal pain and GI bleed. EXAM: CT ABDOMEN AND PELVIS WITH CONTRAST TECHNIQUE: Multidetector CT imaging of the abdomen and pelvis was performed using the standard protocol following bolus administration of intravenous contrast. CONTRAST:  100mL OMNIPAQUE IOHEXOL 300 MG/ML  SOLN COMPARISON:  CT dated 07/18/2012 FINDINGS: Cardiomegaly. The visualized lung bases are clear. No intra-abdominal free air or free fluid. A 1 cm right hepatic hypodense lesion is not well characterized. The gallbladder, pancreas, spleen, and adrenal glands appear unremarkable. Right renal cortical irregularity likely related to prior infarct and scarring. There is no hydronephrosis on either side. The visualized ureters appear unremarkable. There is apparent diffuse thickening of the bladder wall which may be partly related to underdistention. Cystitis is not excluded. Correlation with urinalysis recommended. The uterus is anteverted. A device in the lower uterus may represent a pessary. There is extensive sigmoid and colonic diverticulosis without active inflammatory changes. There is high  attenuating material extending from a diverticulum in the proximal ascending colon compatible with diverticular bleed. Loose stool noted within the colon compatible with diarrheal state. There is no evidence of bowel obstruction. Normal appendix. There is aortoiliac atherosclerotic disease. The abdominal aorta is tortuous. No portal venous gas identified. There is no adenopathy. There is osteopenia and degenerative changes of the spine. No acute fracture. IMPRESSION: Extensive colonic diverticulosis with active diverticular bleed in the proximal ascending colon. Electronically Signed   By: Elgie Collard M.D.   On: 09/12/2015 06:17     Lab Results: Basic Metabolic Panel:  Recent Labs  40/98/11 0450  NA 134*  K 3.9  CL 96*  CO2 32  GLUCOSE 110*  BUN 20  CREATININE 0.85  CALCIUM 9.1   Liver Function Tests:  Recent Labs  09/12/15 0450  AST 28  ALT 20  ALKPHOS 61  BILITOT 0.5  PROT 6.3*  ALBUMIN 3.3*     CBC:  Recent Labs  09/12/15 0450  09/13/15 0050 09/13/15 0635 09/14/15 0703  WBC 8.0  < > 12.6* 10.0  --   NEUTROABS 5.4  --   --   --   --   HGB 11.6*  < > 9.3* 9.8* 10.1*  HCT 35.9*  < > 28.3* 29.6* 31.1*  MCV 93.7  < > 89.3 90.8  --   PLT 291  < > 194 207  --   < > = values in this interval not displayed.  No results found for this or any previous visit (from the past 240 hour(s)).   Hospital Course:   This is a 82 years old female with history of multiple medical illnesses was admitted due to rectal bleeding. Patient was seen by GI and colonoscopy done which showed diverticular bleed. She received 2 units of packed RBC. Her bleeed stopped. Patient improved and discharged in stable condition.  Discharge Exam: Blood pressure 141/51, pulse 70, temperature 97.9 F (36.6 C), temperature source Oral, resp. rate 19, height 5\' 5"  (1.651 m), weight 60.073 kg (132 lb 7 oz), SpO2 100 %.    Disposition:  Assisted living      Signed: Meggin Ola   09/14/2015, 8:18 AM

## 2015-09-14 NOTE — Clinical Social Work Note (Signed)
Patient had a PT eval at her daughter's request. Patient was recommended for SNF.  [facility]  CSW faxed clinicals.  [facility]  CSW advised that patient was being discharged today.   CSW facilitated discharge.   CSW notified patient's daughter, Mrs. Shanon Rosserage, that patient was being discharged and [facility]   CSW sent clinicals via [employer].   CSW signing off.  Annice NeedySettle, Jamiya Nims D, [place]CS[place] [phone]

## 2015-09-14 NOTE — Evaluation (Signed)
Physical Therapy Evaluation Patient Details Name: Felicia Wells MRN: [id number] DOB: [birth year] Today's Date: 09/14/2015   History of Present Illness  Pt is a 82 year old female resident of [facility] who was admitted with a GI bleed.  She is now being referred to PT due to weakness  Clinical Impression   Pt was seen for evaluation.  She was alert and very cooperative, able to follow all directions.  Her memory is very poor.  She normally ambulates with a cane at [facility] and is independent with most ADLs.  Currently, she now demonstrates generalized weakness and deconditioning.  Her gait stability with a cane is poor and she now needs a walker.  Endurance with a walker would preclude her from getting to the dining room for meals.  She would benefit from short term SNF at d/c to enable her to return to [facility].    Follow Up Recommendations SNF    Equipment Recommendations  None recommended by PT    Recommendations for Other Services   none    Precautions / Restrictions Precautions Precautions: Fall Restrictions Weight Bearing Restrictions: No      Mobility  Bed Mobility Overal bed mobility: Modified Independent                Transfers Overall transfer level: Modified independent Equipment used: Straight cane;Rolling walker (2 wheeled)             General transfer comment: pt is able to stand with cane or walker and also able to do a pivot transfer bed to [facility] without physical assistance  Ambulation/Gait Ambulation/Gait assistance: Min assist Ambulation Distance (Feet): 40 Feet Assistive device: Rolling walker (2 wheeled);Straight cane Gait Pattern/deviations: Trunk flexed;Shuffle   Gait velocity interpretation: <1.8 ft/sec, indicative of risk for recurrent falls General Gait Details: gait with a cane is very unstable and pt was instructed in gait with a rolling walker.  Stability was improved with a walker but endurance is very poor.  Stairs             Wheelchair Mobility    Modified Rankin (Stroke Patients Only)       Balance Overall balance assessment: Needs assistance Sitting-balance support: No upper extremity supported;Feet supported Sitting balance-Leahy Scale: Normal     Standing balance support: Single extremity supported Standing balance-Leahy Scale: Poor                               Pertinent Vitals/Pain Pain Assessment: No/denies pain    Home Living Family/patient expects to be discharged to:: Skilled nursing facility                      Prior Function Level of Independence: Independent with assistive device(s)         Comments: usually walks with a straight cane     Hand Dominance   Dominant Hand: Right    Extremity/Trunk Assessment   Upper Extremity Assessment: Generalized weakness           Lower Extremity Assessment: Generalized weakness      Cervical / Trunk Assessment: Kyphotic  Communication   Communication: No difficulties  Cognition Arousal/Alertness: Awake/alert Behavior During Therapy: WFL for tasks assessed/performed Overall Cognitive Status: History of cognitive impairments - at baseline                      General Comments      Exercises  Assessment/Plan    PT Assessment All further PT needs can be met in the next venue of care  PT Diagnosis Difficulty walking;Generalized weakness   PT Problem List Decreased strength;Decreased activity tolerance;Decreased balance;Decreased mobility;Decreased cognition;Decreased knowledge of use of DME;Decreased safety awareness  PT Treatment Interventions     PT Goals (Current goals can be found in the Care Plan section) Acute Rehab PT Goals PT Goal Formulation: All assessment and education complete, DC therapy    Frequency     Barriers to discharge        Co-evaluation               End of Session Equipment Utilized During Treatment: Gait belt Activity Tolerance: Patient  tolerated treatment well;Patient limited by fatigue Patient left: in bed;with call bell/phone within reach;with bed alarm set;with family/visitor present Nurse Communication: Mobility status         Time: 1100-1128 PT Time Calculation (min) (ACUTE ONLY): 28 min   Charges:   PT Evaluation $PT Eval Moderate Complexity: 1 Procedure     PT G CodesDemetrios Isaacs L  PT 09/14/2015, 11:38 AM [phone]

## 2015-09-14 NOTE — NC FL2 (Deleted)
[address] MEDICAID FL2 LEVEL OF CARE SCREENING TOOL     IDENTIFICATION  Patient Name: Felicia Wells Birthdate: [birth year] Sex: female Admission Date (Current Location): 09/12/2015  [facility] [place]   Facility and Address:  [facility],  [address], [place] [id number]      Provider Number: [id number]  Attending Physician Name and Address:  Kari BaarsEdward Hawkins, MD  Relative Name and Phone Number:       Current Level of Care: Hospital Recommended Level of Care: Assisted Living Facility Prior Approval Number:    Date Approved/Denied:   PASRR Number:  [id number])  Discharge Plan:  (Assisted Living Facility)    Current Diagnoses: Patient Active Problem List   Diagnosis Date Noted  . Hematochezia 09/12/2015  . DNR (do not resuscitate) 09/12/2015  . Bleeding gastrointestinal   . CAP (community acquired pneumonia) 12/04/2012  . Hyponatremia 12/04/2012  . Muscular deconditioning 12/04/2012  . Acute delirium 12/04/2012  . Fracture of acetabulum (HCC) 07/19/2012  . Hypertension 07/19/2012  . Meniere's disease 07/19/2012  . Muscle weakness (generalized) 06/03/2011  . Difficulty in walking(719.7) 06/03/2011    Orientation RESPIRATION BLADDER Height & Weight    Self, Time, Situation, Place  Normal Continent 5\' 5"  (165.1 cm) 132 lbs.  BEHAVIORAL SYMPTOMS/MOOD NEUROLOGICAL BOWEL NUTRITION STATUS      Continent  (Diet: Heart Healthy)  AMBULATORY STATUS COMMUNICATION OF NEEDS Skin   Limited Assist Verbally Normal                       Personal Care Assistance Level of Assistance  Bathing, Dressing Bathing Assistance: Limited assistance   Dressing Assistance: Limited assistance     Functional Limitations Info  Sight, Speech, Hearing Sight Info: Adequate Hearing Info: Adequate Speech Info: Adequate    SPECIAL CARE FACTORS FREQUENCY                       Contractures      Additional Factors Info  Psychotropic  Code Status Info:  (DNR) Allergies Info:  (Patient has multiple allergies, please see H&P) Psychotropic Info:  (Valium)         Current Medications (09/14/2015):  This is the current hospital active medication list Current Facility-Administered Medications  Medication Dose Route Frequency Provider Last Rate Last Dose  . 0.9 %  sodium chloride infusion  10 mL/hr Intravenous Once Kristen N Ward, DO      . albuterol (PROVENTIL) (2.5 MG/3ML) 0.083% nebulizer solution 2.5 mg  2.5 mg Nebulization Q6H PRN Kari BaarsEdward Hawkins, MD      . cholecalciferol (VITAMIN D) tablet 500 Units  500 Units Oral BID Houston SirenPeter Le, MD   500 Units at 09/13/15 2229  . dextrose 5 % and 0.9 % NaCl with KCl 20 mEq/L infusion   Intravenous Continuous Houston SirenPeter Le, MD 100 mL/hr at 09/14/15 0359    . diazepam (VALIUM) tablet 2 mg  2 mg Oral Q12H PRN Kari BaarsEdward Hawkins, MD   2 mg at 09/12/15 1823  . famotidine (PEPCID) tablet 20 mg  20 mg Oral BID Malissa HippoNajeeb U Rehman, MD   20 mg at 09/13/15 2229  . fentaNYL (SUBLIMAZE) injection 12.5 mcg  12.5 mcg Intravenous Q2H PRN Kari BaarsEdward Hawkins, MD      . ondansetron [facility](ZOFRAN) injection 4 mg  4 mg Intravenous Q6H PRN Malissa HippoNajeeb U Rehman, MD   4 mg at 09/12/15 1833  . polyvinyl alcohol (LIQUIFILM TEARS) 1.4 % ophthalmic solution 1  drop  1 drop Both Eyes BID Houston Siren, MD   1 drop at 09/13/15 2236  . sodium chloride 0.9 % injection 3 mL  3 mL Intravenous Q12H Houston Siren, MD   3 mL at 09/13/15 2237     Discharge Medications: TAKE these medications       albuterol 108 (90 Base) MCG/ACT inhaler  Commonly known as: PROVENTIL HFA;VENTOLIN HFA  Inhale 1-2 puffs into the lungs every 6 (six) hours as needed for wheezing or shortness of breath.     BYSTOLIC 20 MG Tabs  Generic drug: Nebivolol HCl  Take 20 mg by mouth 2 (two) times daily.     cholecalciferol 1000 units tablet  Commonly known as: VITAMIN D  Take 500 Units by mouth 2 (two) times daily.     diazepam 2 MG tablet  Commonly known  as: VALIUM  Take 2 mg by mouth 2 (two) times daily as needed (meniere's disease).     famotidine 20 MG tablet  Commonly known as: PEPCID  Take 1 tablet (20 mg total) by mouth 2 (two) times daily.     guaiFENesin-dextromethorphan 100-10 MG/5ML syrup  Commonly known as: ROBITUSSIN DM  Take 5 mLs by mouth every 4 (four) hours as needed for cough.     hydrALAZINE 10 MG tablet  Commonly known as: APRESOLINE  Take 10 mg by mouth 2 (two) times daily.     hydrochlorothiazide 25 MG tablet  Commonly known as: HYDRODIURIL  Take 25 mg by mouth daily.     LORazepam 0.5 MG tablet  Commonly known as: ATIVAN  Take 1 tablet by mouth 3 (three) times daily as needed (anxiety/agitation).     ondansetron 4 MG tablet  Commonly known as: ZOFRAN  Take 4 mg by mouth 4 (four) times daily as needed for nausea or vomiting.     potassium chloride 10 MEQ tablet  Commonly known as: K-DUR  Take 1 tablet (10 mEq total) by mouth daily.     SOOTHE XP OP  Apply 2 drops to eye 4 (four) times daily. Instil one drop in each eye           Relevant Imaging Results:  Relevant Lab Results:   Additional Information    Deyci Gesell, Juleen China, LCSW

## 2015-09-14 NOTE — Consult Note (Signed)
Consultation Note Date: 09/14/2015   Patient Name: Felicia Wells  DOB: 10-14-1918  MRN: [id number]  Age / Sex: 82 y.o., female  PCP: Kari Baars, MD Referring Physician: No att. providers found  Reason for Consultation: Disposition, Establishing goals of care and Psychosocial/spiritual support    Clinical Assessment/Narrative: Felicia Wells is lying in bed resting, her daughter Felicia Wells at bedside.  She is sleeping but wakes easily.  Felicia Wells and I go to my office to talk. We call her brother, Felicia Wells son Felicia Wells.   We talk about disposition, and their worry over her ability to care for herself.  Their father is now with Hospice and seems to be dying.  Felicia Wells feels that Felicia Wells has weakened and needs PT/rehab at [facility].   We talk about her chronic illness trajectory, and we complete the MOST form, comfort measures, No IV fluids or IV antibiotics, (po antibiotics ok), and do not transfer to the hospital unless her pain/symptoms are not controlled at the SNF. I encourage Felicia Wells to be with his father who seems to be actively dying at this time, and let Felicia Wells help Felicia Wells.  I encourage them to reach out to me, if needed, while she is at [facility].   Contacts/Participants in Discussion:  Children, Felicia Wells, and Felicia Wells Primary Decision Maker: children   Relationship to Patient children HCPOA: yes  Felicia Wells.   SUMMARY OF RECOMMENDATIONS  Code Status/Advance Care Planning: DNR    Code Status Orders        Start     Ordered   09/12/15 0831  Do not attempt resuscitation (DNR)   Continuous    Question Answer Comment  In the event of cardiac or respiratory ARREST Do not call a "code blue"   In the event of cardiac or respiratory ARREST Do not perform Intubation, CPR, defibrillation or ACLS   In the event of cardiac or respiratory ARREST Use medication by any route, position, wound care, and other measures to relive  pain and suffering. May use oxygen, suction and manual treatment of airway obstruction as needed for comfort.      09/12/15 0830    Code Status History    Date Active Date Inactive Code Status Order ID Comments User Context   09/12/2015  4:46 AM 09/12/2015  8:30 AM DNR [id number]  Layla Maw Ward, DO ED   07/18/2012 10:57 PM 07/22/2012  6:01 PM Full Code [id number]  Carylon Perches, MD ED      Other Directives:MOST Form  Symptom Management:   Per hospitalist   Palliative Prophylaxis:   Frequent Pain Assessment  Additional Recommendations (Limitations, Scope, Preferences):  Full Comfort Care  Psycho-social/Spiritual:  Support System: Strong Desire for further Chaplaincy support:Not discussed today  Additional Recommendations: Not at this time.   Prognosis: < 3 months, likely d/t chronic conditions and desire for less invasive treatments.   Discharge Planning: Skilled Nursing Facility for rehab with Palliative care service follow-up   Chief Complaint/ Primary Diagnoses: Present on Admission:  . Hematochezia . Hypertension . DNR (do not resuscitate)  I have reviewed the medical record, interviewed the patient and family, and examined the patient. The following aspects are pertinent.  Past Medical History  Diagnosis Date  . Infections of kidney   . Hypertension   . Osteoporosis   . Eye problem   . Anxiety    Social History   Social History  . Marital Status: Married    Spouse Name: N/A  . Number of  Children: N/A  . Years of Education: N/A   Social History Main Topics  . Smoking status: Never Smoker   . Smokeless tobacco: Never Used  . Alcohol Use: No  . Drug Use: No  . Sexual Activity: No   Other Topics Concern  . None   Social History Narrative   History reviewed. No pertinent family history. Scheduled Meds: . sodium chloride  10 mL/hr Intravenous Once  . cholecalciferol  500 Units Oral BID  . famotidine  20 mg Oral BID  . polyvinyl alcohol  1 drop Both  Eyes BID  . sodium chloride  3 mL Intravenous Q12H   Continuous Infusions: . dextrose 5 % and 0.9 % NaCl with KCl 20 mEq/L 100 mL/hr at 09/14/15 0359   PRN Meds:.albuterol, diazepam, fentaNYL (SUBLIMAZE) injection, ondansetron (ZOFRAN) IV Medications Prior to Admission:  Prior to Admission medications   Medication Sig Start Date End Date Taking? Authorizing Provider  albuterol (PROVENTIL HFA;VENTOLIN HFA) 108 (90 BASE) MCG/ACT inhaler Inhale 1-2 puffs into the lungs every 6 (six) hours as needed for wheezing or shortness of breath. 09/13/13  Yes Donnetta Hutching, MD  Artificial Tear Solution (SOOTHE XP OP) Apply 2 drops to eye 4 (four) times daily. Instil one drop in each eye   Yes Historical Provider, MD  cholecalciferol (VITAMIN D) 1000 UNITS tablet Take 500 Units by mouth 2 (two) times daily.   Yes Historical Provider, MD  diazepam (VALIUM) 2 MG tablet Take 2 mg by mouth 2 (two) times daily as needed (meniere's disease).  07/20/15  Yes Historical Provider, MD  guaiFENesin-dextromethorphan (ROBITUSSIN DM) 100-10 MG/5ML syrup Take 5 mLs by mouth every 4 (four) hours as needed for cough.   Yes Historical Provider, MD  hydrALAZINE (APRESOLINE) 10 MG tablet Take 10 mg by mouth 2 (two) times daily.   Yes Historical Provider, MD  hydrochlorothiazide (HYDRODIURIL) 25 MG tablet Take 25 mg by mouth daily.   Yes Historical Provider, MD  LORazepam (ATIVAN) 0.5 MG tablet Take 1 tablet by mouth 3 (three) times daily as needed (anxiety/agitation).  07/20/15  Yes Historical Provider, MD  Nebivolol HCl (BYSTOLIC) 20 MG TABS Take 20 mg by mouth 2 (two) times daily.   Yes Historical Provider, MD  ondansetron (ZOFRAN) 4 MG tablet Take 4 mg by mouth 4 (four) times daily as needed for nausea or vomiting.  09/07/15  Yes Historical Provider, MD  potassium chloride (K-DUR) 10 MEQ tablet Take 1 tablet (10 mEq total) by mouth daily. 12/04/12  Yes Kari Baars, MD  famotidine (PEPCID) 20 MG tablet Take 1 tablet (20 mg total) by  mouth 2 (two) times daily. 09/14/15   Avon Gully, MD   Allergies  Allergen Reactions  . Alendronate     Other reaction(s): Cramps (ALLERGY/intolerance)  . Codeine Nausea Only  . Darvocet [Propoxyphene N-Acetaminophen] Nausea Only  . Demerol Nausea Only  . Ezetimibe-Simvastatin     Other reaction(s): Other (See Comments) numbness  . Hydrochlorothiazide W-Triamterene Nausea And Vomiting  . Meperidine Nausea And Vomiting  . Morphine And Related     opiods  . Niacin Nausea Only    bloat  . Other Other (See Comments)    Butazolidin Derm - swelling Sensitive to Cholesterol medications and blood pressure medications , possible fluid retention  . Oxybutynin Chloride     Other reaction(s): Other (See Comments) Forgetful and disorriented  . Prednisone Other (See Comments)    disoriented  . Propoxyphene Nausea And Vomiting  . Raloxifene  Other reaction(s): Cramps (ALLERGY/intolerance)  . Risedronate Other (See Comments)    Other reaction(s): Dizziness (intolerance) Affects pulse  . Simvastatin Other (See Comments)    Bloat  . Diltiazem Hcl Rash    Derm  . Lansoprazole Rash    Derm  . Pravastatin Rash  . Sulfa Antibiotics Rash  . Teriparatide Other (See Comments) and Palpitations    Review of Systems  Unable to perform ROS: Dementia    Physical Exam  Nursing note and vitals reviewed. Constitutional: She appears well-developed and well-nourished. No distress.  HENT:  Head: Normocephalic and atraumatic.  Cardiovascular: Normal rate and regular rhythm.   Respiratory: Effort normal and breath sounds normal. No respiratory distress.  GI: Soft. There is no tenderness. There is no guarding.  Neurological: She is alert.  Skin: Skin is warm and dry.    Vital Signs: BP 141/51 mmHg  Pulse 70  Temp(Src) 97.9 F (36.6 C) (Oral)  Resp 19  Ht 5\' 5"  (1.651 m)  Wt 60.073 kg (132 lb 7 oz)  BMI 22.04 kg/m2  SpO2 100%  SpO2: SpO2: 100 % O2 Device:SpO2: 100 % O2 Flow Rate:  .   IO: Intake/output summary:  Intake/Output Summary (Last 24 hours) at 09/14/15 1506 Last data filed at 09/14/15 1300  Gross per 24 hour  Intake   3137 ml  Output   1175 ml  Net   1962 ml    LBM: Last BM Date: 09/12/15 Baseline Weight: Weight: 60.073 kg (132 lb 7 oz) Most recent weight: Weight: 60.073 kg (132 lb 7 oz)      Palliative Assessment/Data:  Flowsheet Rows        Most Recent Value   Intake Tab    Referral Department  Pulmonary   Unit at Time of Referral  Med/Surg Unit   Palliative Care Primary Diagnosis  Other (Comment) [GI bleed]   Date Notified  09/13/15   Palliative Care Type  New Palliative care   Reason for referral  Clarify Goals of Care   Date of Admission  09/12/15   Date first seen by Palliative Care  09/13/15   # of days Palliative referral response time  0 Day(s)   # of days IP prior to Palliative referral  1   Clinical Assessment    Psychosocial & Spiritual Assessment    Palliative Care Outcomes       Additional Data Reviewed:  CBC:    Component Value Date/Time   WBC 10.0 09/13/2015 0635   HGB 10.1* 09/14/2015 0703   HCT 31.1* 09/14/2015 0703   PLT 207 09/13/2015 0635   MCV 90.8 09/13/2015 0635   NEUTROABS 5.4 09/12/2015 0450   LYMPHSABS 1.9 09/12/2015 0450   MONOABS 0.6 09/12/2015 0450   EOSABS 0.1 09/12/2015 0450   BASOSABS 0.1 09/12/2015 0450   Comprehensive Metabolic Panel:    Component Value Date/Time   NA 134* 09/12/2015 0450   K 3.9 09/12/2015 0450   CL 96* 09/12/2015 0450   CO2 32 09/12/2015 0450   BUN 20 09/12/2015 0450   CREATININE 0.85 09/12/2015 0450   GLUCOSE 110* 09/12/2015 0450   CALCIUM 9.1 09/12/2015 0450   AST 28 09/12/2015 0450   ALT 20 09/12/2015 0450   ALKPHOS 61 09/12/2015 0450   BILITOT 0.5 09/12/2015 0450   PROT 6.3* 09/12/2015 0450   ALBUMIN 3.3* 09/12/2015 0450     Time In: 0920 Time Out: 1050 Time Total:  90 minutes GOC discussion shared with nursing staff, CM, SW, Dr.  Fanta, and Dr. Juanetta GoslingHawkins  on next rounds.  Greater than 50%  of this time was spent counseling and coordinating care related to the above assessment and plan.  Signed by: Katheran Aweove,Tasha A, NP  Katheran Aweasha A Dove, NP  09/14/2015, 3:06 PM  Please contact Palliative Medicine Team phone at [phone] for questions and concerns.

## 2015-09-14 NOTE — Care Management Important Message (Signed)
Important Message  Patient Details  Name: Felicia Wells MRN: [id number] Date of Birth: [birth year]   Medicare Important Message Given:  Yes    Adonis HugueninBerkhead, Skiler Tye L, RN 09/14/2015, 10:08 AM

## 2015-09-14 NOTE — Progress Notes (Signed)
Patient discharged to [facility].  IVs removed - WNL.  Report called and given to Renee at facility.  Daughter aware of plans and at bedside.  Patient stable at this time.  [facility]

## 2015-09-15 ENCOUNTER — Encounter (HOSPITAL_COMMUNITY)
Admission: RE | Admit: 2015-09-15 | Discharge: 2015-09-15 | Disposition: A | Discharge status: Home / Self Care | Payer: Medicare Other | Source: Skilled Nursing Facility | Attending: Pulmonary Disease | Admitting: Pulmonary Disease

## 2015-09-15 DIAGNOSIS — I10 Essential (primary) hypertension: Secondary | ICD-10-CM | POA: Insufficient documentation

## 2015-10-22 NOTE — Discharge Summary (Signed)
Felicia Wells, Felicia Wells              ACCOUNT NO.:  [id number]  MEDICAL RECORD NO.:  [id number]  LOCATION:                                 FACILITY:  PHYSICIAN:  Zubin Pontillo L. Juanetta Gosling, M.D.DATE OF BIRTH:  November 28, 1918  DATE OF ADMISSION:  10/15/2015 DATE OF DISCHARGE:  01/13/2017LH                              DISCHARGE SUMMARY   ADDENDUM  DISCHARGE DIAGNOSIS:  Blood loss anemia, acute.     [name] L. Juanetta Gosling, M.D.     EL[facility]/MEDQ  D:  10/21/2015  T:  10/21/2015  Job:  [id number]

## 2016-04-09 ENCOUNTER — Inpatient Hospital Stay (HOSPITAL_COMMUNITY)
Admission: EM | Admit: 2016-04-09 | Discharge: 2016-04-11 | DRG: 378 | Disposition: A | Discharge status: Nursing Home (Includes ICF) | Payer: Medicare Other | Attending: Pulmonary Disease | Admitting: Pulmonary Disease

## 2016-04-09 ENCOUNTER — Encounter (HOSPITAL_COMMUNITY): Payer: Self-pay | Admitting: *Deleted

## 2016-04-09 DIAGNOSIS — K625 Hemorrhage of anus and rectum: Secondary | ICD-10-CM | POA: Diagnosis present

## 2016-04-09 DIAGNOSIS — K5731 Diverticulosis of large intestine without perforation or abscess with bleeding: Secondary | ICD-10-CM | POA: Diagnosis not present

## 2016-04-09 DIAGNOSIS — K219 Gastro-esophageal reflux disease without esophagitis: Secondary | ICD-10-CM | POA: Diagnosis present

## 2016-04-09 DIAGNOSIS — Z7982 Long term (current) use of aspirin: Secondary | ICD-10-CM | POA: Diagnosis not present

## 2016-04-09 DIAGNOSIS — K922 Gastrointestinal hemorrhage, unspecified: Secondary | ICD-10-CM | POA: Diagnosis not present

## 2016-04-09 DIAGNOSIS — I10 Essential (primary) hypertension: Secondary | ICD-10-CM | POA: Diagnosis present

## 2016-04-09 DIAGNOSIS — M810 Age-related osteoporosis without current pathological fracture: Secondary | ICD-10-CM | POA: Diagnosis present

## 2016-04-09 DIAGNOSIS — F0391 Unspecified dementia with behavioral disturbance: Secondary | ICD-10-CM | POA: Diagnosis present

## 2016-04-09 DIAGNOSIS — K921 Melena: Secondary | ICD-10-CM

## 2016-04-09 DIAGNOSIS — Z66 Do not resuscitate: Secondary | ICD-10-CM | POA: Diagnosis present

## 2016-04-09 DIAGNOSIS — F03918 Unspecified dementia, unspecified severity, with other behavioral disturbance: Secondary | ICD-10-CM | POA: Diagnosis present

## 2016-04-09 HISTORY — DX: Gastrointestinal hemorrhage, unspecified: K92.2

## 2016-04-09 HISTORY — DX: Gastro-esophageal reflux disease without esophagitis: K21.9

## 2016-04-09 LAB — COMPREHENSIVE METABOLIC PANEL
ALK PHOS: 64 U/L (ref 38–126)
ALT: 14 U/L (ref 14–54)
AST: 21 U/L (ref 15–41)
Albumin: 3.4 g/dL — ABNORMAL LOW (ref 3.5–5.0)
Anion gap: 4 — ABNORMAL LOW (ref 5–15)
BILIRUBIN TOTAL: 0.5 mg/dL (ref 0.3–1.2)
BUN: 19 mg/dL (ref 6–20)
CALCIUM: 8.2 mg/dL — AB (ref 8.9–10.3)
CHLORIDE: 99 mmol/L — AB (ref 101–111)
CO2: 30 mmol/L (ref 22–32)
CREATININE: 0.71 mg/dL (ref 0.44–1.00)
Glucose, Bld: 106 mg/dL — ABNORMAL HIGH (ref 65–99)
Potassium: 3.8 mmol/L (ref 3.5–5.1)
Sodium: 133 mmol/L — ABNORMAL LOW (ref 135–145)
Total Protein: 6.1 g/dL — ABNORMAL LOW (ref 6.5–8.1)

## 2016-04-09 LAB — PROTIME-INR
INR: 1.07
INR: 1.22
PROTHROMBIN TIME: 13.9 s (ref 11.4–15.2)
Prothrombin Time: 15.5 seconds — ABNORMAL HIGH (ref 11.4–15.2)

## 2016-04-09 LAB — CBC
HCT: 35 % — ABNORMAL LOW (ref 36.0–46.0)
HCT: 33.7 % — ABNORMAL LOW (ref 36.0–46.0)
HEMATOCRIT: 27.6 % — AB (ref 36.0–46.0)
HEMOGLOBIN: 9 g/dL — AB (ref 12.0–15.0)
Hemoglobin: 11.5 g/dL — ABNORMAL LOW (ref 12.0–15.0)
Hemoglobin: 11.5 g/dL — ABNORMAL LOW (ref 12.0–15.0)
MCH: 30.5 pg (ref 26.0–34.0)
MCH: 30.6 pg (ref 26.0–34.0)
MCH: 31.6 pg (ref 26.0–34.0)
MCHC: 32.9 g/dL (ref 30.0–36.0)
MCHC: 32.6 g/dL (ref 30.0–36.0)
MCHC: 34.1 g/dL (ref 30.0–36.0)
MCV: 92.8 fL (ref 78.0–100.0)
MCV: 93.9 fL (ref 78.0–100.0)
MCV: 92.6 fL (ref 78.0–100.0)
PLATELETS: 268 10*3/uL (ref 150–400)
PLATELETS: 183 10*3/uL (ref 150–400)
Platelets: 236 10*3/uL (ref 150–400)
RBC: 3.77 MIL/uL — AB (ref 3.87–5.11)
RBC: 2.94 MIL/uL — AB (ref 3.87–5.11)
RBC: 3.64 MIL/uL — AB (ref 3.87–5.11)
RDW: 15 % (ref 11.5–15.5)
RDW: 15 % (ref 11.5–15.5)
RDW: 14.4 % (ref 11.5–15.5)
WBC: 8.3 10*3/uL (ref 4.0–10.5)
WBC: 16.6 10*3/uL — AB (ref 4.0–10.5)
WBC: 15.9 10*3/uL — AB (ref 4.0–10.5)

## 2016-04-09 LAB — PREPARE RBC (CROSSMATCH)

## 2016-04-09 LAB — HEMOGLOBIN AND HEMATOCRIT, BLOOD
HEMATOCRIT: 28.3 % — AB (ref 36.0–46.0)
Hemoglobin: 9.4 g/dL — ABNORMAL LOW (ref 12.0–15.0)

## 2016-04-09 LAB — MRSA PCR SCREENING: MRSA by PCR: NEGATIVE

## 2016-04-09 LAB — APTT: APTT: 26 s (ref 24–36)

## 2016-04-09 MED ORDER — SODIUM CHLORIDE 0.9 % IV SOLN
INTRAVENOUS | Status: DC
  Administered 2016-04-09 – 2016-04-11 (×5): via INTRAVENOUS

## 2016-04-09 MED ORDER — ONDANSETRON HCL 4 MG/2ML IJ SOLN
INTRAMUSCULAR | Status: AC
Start: 2016-04-09 — End: 2016-04-09
  Administered 2016-04-09: 4 mg
  Filled 2016-04-09: qty 2

## 2016-04-09 MED ORDER — ONDANSETRON HCL 4 MG/2ML IJ SOLN
4.0000 mg | Freq: Four times a day (QID) | INTRAMUSCULAR | Status: DC | PRN
  Administered 2016-04-10 – 2016-04-11 (×2): 4 mg via INTRAVENOUS
  Filled 2016-04-09 (×2): qty 2

## 2016-04-09 MED ORDER — SODIUM CHLORIDE 0.9 % IV SOLN
Freq: Once | INTRAVENOUS | Status: AC
  Administered 2016-04-09: 15:00:00 via INTRAVENOUS

## 2016-04-09 MED ORDER — ONDANSETRON HCL 4 MG PO TABS: 4.0000 mg | ORAL_TABLET | Freq: Four times a day (QID) | ORAL | Status: DC | PRN

## 2016-04-09 MED ORDER — SODIUM CHLORIDE 0.9 % IV BOLUS (SEPSIS)
1000.0000 mL | Freq: Once | INTRAVENOUS | Status: AC
  Administered 2016-04-09: 1000 mL via INTRAVENOUS

## 2016-04-09 NOTE — ED Notes (Signed)
Pt had 3rd bm of only blood. Pt cleaned.

## 2016-04-09 NOTE — ED Notes (Signed)
Felicia Wells [phone], daughter in Social workerlaw

## 2016-04-09 NOTE — Consult Note (Signed)
Referring Provider: No ref. provider found Primary Care Physician:  Fredirick Maudlin, MD Primary Gastroenterologist:  Dr. Karilyn Cota  Reason for Consultation:    Rectal bleeding.  HPI:   Patient is 82 year old Caucasian female who has history of colonic diverticular bleed(2006 when she had colonoscopy) and more recently lower GI bleed in January this year presumed to be due to colonic diverticulosis. She stopped bleeding spontaneously. She did require 2 units of PRBCs. She now lives at [facility] and was in usual state of felt until this morning when she complain of lower abdominal pain and began to pass fresh blood per rectum. She felt lightheaded and dizzy. Patient was brought to emergency room by EMS. While in emergency room she had few more bloody bowel movements and while she was being assisted back to bed she became hypotensive and had near-syncope. Pressure quickly came out with IV fluids. Patient was admitted to regular floor and had dizzy spell and she was transferred to ICU for closer monitoring. Since she has been in ICU she hasn't passed any more blood. She denies abdominal pain. She also denies nausea vomiting chest pain or shortness of breath. According to her daughter [name] who is at bedside she remains with diarrhea and has frequent accidents. She has been using depends. Over she did not experience rectal bleeding since last hospitalization 7 months ago until today. Patient is on low-dose aspirin. Issues last colonoscopy was in October 2006 revealing pancolonic diverticulosis. She also had colonic biopsy and was negative for microscopic colitis.   Past Medical History:  Diagnosis Date  . Anxiety   . Eye problem   . GERD (gastroesophageal reflux disease)   . Hypertension   . Infections of kidney   . Osteoporosis     Past Surgical History:  Procedure Laterality Date  . brain shunt    . CATARACT EXTRACTION    . DILATION AND CURETTAGE OF UTERUS    . EYE SURGERY    . FOOT  SURGERY    . HERNIA REPAIR    . THYROID SURGERY    . TONSILLECTOMY      Prior to Admission medications   Medication Sig Start Date End Date Taking? Authorizing Provider  albuterol (PROVENTIL HFA;VENTOLIN HFA) 108 (90 BASE) MCG/ACT inhaler Inhale 1-2 puffs into the lungs every 6 (six) hours as needed for wheezing or shortness of breath. 09/13/13  Yes Donnetta Hutching, MD  Artificial Tear Solution (SOOTHE XP OP) Apply 2 drops to eye 4 (four) times daily. Instil one drop in each eye   Yes Historical Provider, MD  aspirin EC 81 MG tablet Take 81 mg by mouth daily.   Yes Historical Provider, MD  cholecalciferol (VITAMIN D) 1000 UNITS tablet Take 500 Units by mouth 2 (two) times daily.   Yes Historical Provider, MD  diazepam (VALIUM) 2 MG tablet Take 2 mg by mouth 2 (two) times daily as needed (meniere's disease).  07/20/15  Yes Historical Provider, MD  famotidine (PEPCID) 20 MG tablet Take 1 tablet (20 mg total) by mouth 2 (two) times daily. 09/14/15  Yes Avon Gully, MD  guaiFENesin-dextromethorphan (ROBITUSSIN DM) 100-10 MG/5ML syrup Take 5 mLs by mouth every 4 (four) hours as needed for cough.   Yes Historical Provider, MD  hydrALAZINE (APRESOLINE) 10 MG tablet Take 10 mg by mouth 2 (two) times daily.   Yes Historical Provider, MD  hydrochlorothiazide (HYDRODIURIL) 25 MG tablet Take 25 mg by mouth daily.   Yes Historical Provider, MD  LORazepam (ATIVAN) 0.5 MG  tablet Take 1 tablet by mouth 3 (three) times daily as needed (anxiety/agitation).  07/20/15  Yes Historical Provider, MD  Multiple Vitamins-Iron (MULTIVITAMINS WITH IRON) TABS tablet Take 1 tablet by mouth daily.   Yes Historical Provider, MD  multivitamin-lutein (OCUVITE-LUTEIN) CAPS capsule Take 1 capsule by mouth daily.   Yes Historical Provider, MD  Nebivolol HCl (BYSTOLIC) 20 MG TABS Take 20 mg by mouth 2 (two) times daily.   Yes Historical Provider, MD  Omega-3 1000 MG CAPS Take 2 capsules by mouth daily.   Yes Historical Provider, MD   ondansetron (ZOFRAN) 4 MG tablet Take 4 mg by mouth 4 (four) times daily as needed for nausea or vomiting.  09/07/15  Yes Historical Provider, MD  potassium chloride (K-DUR) 10 MEQ tablet Take 1 tablet (10 mEq total) by mouth daily. 12/04/12  Yes Kari Baars, MD  telmisartan (MICARDIS) 80 MG tablet Take 80 mg by mouth daily.   Yes Historical Provider, MD  timolol (BETIMOL) 0.5 % ophthalmic solution Place 1 drop into both eyes daily.   Yes Historical Provider, MD    Current Facility-Administered Medications  Medication Dose Route Frequency Provider Last Rate Last Dose  . 0.9 %  sodium chloride infusion   Intravenous Continuous Henderson Cloud, MD 100 mL/hr at 04/09/16 1507    . ondansetron (ZOFRAN) tablet 4 mg  4 mg Oral Q6H PRN Henderson Cloud, MD       Or  . ondansetron [facility]) injection 4 mg  4 mg Intravenous Q6H PRN Henderson Cloud, MD        Allergies as of 04/09/2016 - Review Complete 04/09/2016  Allergen Reaction Noted  . Alendronate  09/12/2015  . Codeine Nausea Only 10/06/2011  . Darvocet [propoxyphene n-acetaminophen] Nausea Only 10/06/2011  . Demerol Nausea Only 10/06/2011  . Ezetimibe-simvastatin  09/12/2015  . Hydrochlorothiazide w-triamterene Nausea And Vomiting 09/12/2015  . Meperidine Nausea And Vomiting 09/12/2015  . Morphine and related  09/13/2013  . Niacin Nausea Only 09/12/2015  . Other Other (See Comments) 10/06/2011  . Oxybutynin chloride  09/12/2015  . Prednisone Other (See Comments) 09/12/2015  . Propoxyphene Nausea And Vomiting 09/12/2015  . Raloxifene  09/12/2015  . Risedronate Other (See Comments) 09/12/2015  . Simvastatin Other (See Comments) 09/12/2015  . Diltiazem hcl Rash 09/12/2015  . Lansoprazole Rash 09/12/2015  . Pravastatin Rash 09/12/2015  . Sulfa antibiotics Rash 10/06/2011  . Teriparatide Other (See Comments) and Palpitations 09/12/2015    No family history on file.  Social History   Social History  .  Marital status: Married    Spouse name: N/A  . Number of children: N/A  . Years of education: N/A   Occupational History  . Not on file.   Social History Main Topics  . Smoking status: Never Smoker  . Smokeless tobacco: Never Used  . Alcohol use No  . Drug use: No  . Sexual activity: No   Other Topics Concern  . Not on file   Social History Narrative  . No narrative on file    Review of Systems: See HPI, otherwise normal ROS  Physical Exam: Temp:  [97 F (36.1 C)-97.8 F (36.6 C)] 97 F (36.1 C) (08/09 1458) Pulse Rate:  [29-72] 29 (08/09 1430) Resp:  [16-22] 21 (08/09 1430) BP: (86-126)/(53-90) 98/55 (08/09 1430) SpO2:  [89 %-100 %] 89 % (08/09 1430) Weight:  [145 lb (65.8 kg)] 145 lb (65.8 kg) (08/09 0704) Last BM Date: 04/09/16  Patient is alert  and appears to be comfortable in bed. Conjunctiva is pale. Sclerae nonicteric. Oropharyngeal mucosa is normal. No neck masses or thyromegaly noted. Cardiac exam with occasional irregularity. Normal S1 and S2. No murmur or gallop noted. Lungs are clear to auscultation. Abdomen is symmetrical. Bowel sounds are normal. On palpation abdomen is soft and nontender without organomegaly or masses. No clubbing or peripheral edema noted.  Lab Results:  Recent Labs  04/09/16 0730 04/09/16 1251 04/09/16 1401  WBC 8.3  --  16.6*  HGB 11.5* 9.4* 9.0*  HCT 35.0* 28.3* 27.6*  PLT 268  --  236   BMET  Recent Labs  04/09/16 0730  NA 133*  K 3.8  CL 99*  CO2 30  GLUCOSE 106*  BUN 19  CREATININE 0.71  CALCIUM 8.2*   LFT  Recent Labs  04/09/16 0730  PROT 6.1*  ALBUMIN 3.4*  AST 21  ALT 14  ALKPHOS 64  BILITOT 0.5   PT/INR  Recent Labs  04/09/16 0730 04/09/16 1401  LABPROT 13.9 15.5*  INR 1.07 1.22    Assessment;  Large-volume hematochezia in a patient with known history of colonic diverticulosis and diverticular bleed. She was treated for 2 unit bleed in January 2017. Suspect colonic diverticular  bleed. She is receiving units of PRBCs as blood loss appears to be significant and not reflective and old blood work. If patient stops bleeding on her own would not pursue with further studies. If this is not the case options would be colonoscopy following a prep for angiography if she becomes unstable.  Recommendations;  Clear liquids. Serial H&H's. Will reevaluate patient in a.m. or earlier if necessary.   LOS: 0 days   Najeeb Rehman  04/09/2016, 3:30 PM

## 2016-04-09 NOTE — Progress Notes (Signed)
Pt admitted to room 336 from Emergency Department.  Upon transfer to bed, ED staff noted patient to become very light headed, pale and nauseated.  Pt had large bloody BM.  BP 130/70's HR 60-70 at this time.  CBG normal.  Rapid response called.  NS infusion started.  Dr. Ardyth HarpsHernandez paged and made aware.  Pt given IV Zofran.  Family at bedside updated.  BP then noted to drop to 60/30's and HR in the 50's.  New orders received to transfer patient to stepdown bed.  Report given to Rada HayLeighanne Schonewitz, RN.  Verbalized understanding.  Pt transported off floor to ICU bed 12.  Family updated about current plan.  Verbalized understanding.

## 2016-04-09 NOTE — Progress Notes (Signed)
Rapid Response called to room 336. Responded to call and found patient had just been transferred from ED to floor and was feeling lightheaded and nauseous. ER physician arrived at bedside, orders given and carried out.

## 2016-04-09 NOTE — ED Triage Notes (Signed)
[facility] by EMS [facility] for rectal bleeding that EMS describes as small clots. Pt has hx of hemorrhoids. Pt had an episode of dizziness when when moved from sitting to standing to get on the EMS stretcher. IV is established. Pt alert and oriented.

## 2016-04-09 NOTE — ED Notes (Signed)
Pt had moderate bm with only blood and blood clots. Pt cleaned. EDP aware.

## 2016-04-09 NOTE — ED Provider Notes (Addendum)
[facility]-EMERGENCY DEPT Provider Note   CSN: [id number] Arrival date & time: 04/09/16  8295  First Provider Contact:  First MD Initiated Contact with Patient 04/09/16 0725        History   Chief Complaint Chief Complaint  Patient presents with  . Rectal Bleeding    HPI Felicia Wells is a 82 y.o. female.  He resides at [place]. She presents with rectal bleeding since last night.  She states that she feels well. She states that she passed some blood last night and states "the nurses were concerned". She does not have any abdominal pains. She is hungry and wants breakfast. No nausea or vomiting. Is not lightheaded or dizzy. She has not been out of bed this morning. Nurses noted large amount of bright red blood per rectum and she was referred here.  Past medical history for known diverticulitis from previous episode of GI bleed in January. Required 2 units transfusion at that time.  She is DO NOT RESUSCITATE. However, she states that if she requires when she would like to have a blood transfusion. She is uncertain about undergoing colonoscopy.  HPI  Past Medical History:  Diagnosis Date  . Anxiety   . Eye problem   . GERD (gastroesophageal reflux disease)   . Hypertension   . Infections of kidney   . Osteoporosis     Patient Active Problem List   Diagnosis Date Noted  . Lower GI bleed 04/09/2016  . Diverticulosis large intestine w/o perforation or abscess w/bleeding   . Palliative care encounter   . Hematochezia 09/12/2015  . DNR (do not resuscitate) 09/12/2015  . Bleeding gastrointestinal   . CAP (community acquired pneumonia) 12/04/2012  . Hyponatremia 12/04/2012  . Muscular deconditioning 12/04/2012  . Acute delirium 12/04/2012  . Fracture of acetabulum (HCC) 07/19/2012  . Hypertension 07/19/2012  . Meniere's disease 07/19/2012  . Muscle weakness (generalized) 06/03/2011  . Difficulty in walking(719.7) 06/03/2011    Past Surgical History:  Procedure Laterality  Date  . brain shunt    . CATARACT EXTRACTION    . DILATION AND CURETTAGE OF UTERUS    . EYE SURGERY    . FOOT SURGERY    . HERNIA REPAIR    . THYROID SURGERY    . TONSILLECTOMY      OB History    No data available       Home Medications    Prior to Admission medications   Medication Sig Start Date End Date Taking? Authorizing Provider  albuterol (PROVENTIL HFA;VENTOLIN HFA) 108 (90 BASE) MCG/ACT inhaler Inhale 1-2 puffs into the lungs every 6 (six) hours as needed for wheezing or shortness of breath. 09/13/13  Yes Donnetta Hutching, MD  Artificial Tear Solution (SOOTHE XP OP) Apply 2 drops to eye 4 (four) times daily. Instil one drop in each eye   Yes Historical Provider, MD  aspirin EC 81 MG tablet Take 81 mg by mouth daily.   Yes Historical Provider, MD  cholecalciferol (VITAMIN D) 1000 UNITS tablet Take 500 Units by mouth 2 (two) times daily.   Yes Historical Provider, MD  diazepam (VALIUM) 2 MG tablet Take 2 mg by mouth 2 (two) times daily as needed (meniere's disease).  07/20/15  Yes Historical Provider, MD  famotidine (PEPCID) 20 MG tablet Take 1 tablet (20 mg total) by mouth 2 (two) times daily. 09/14/15  Yes Avon Gully, MD  guaiFENesin-dextromethorphan (ROBITUSSIN DM) 100-10 MG/5ML syrup Take 5 mLs by mouth every 4 (four) hours as needed  for cough.   Yes Historical Provider, MD  hydrALAZINE (APRESOLINE) 10 MG tablet Take 10 mg by mouth 2 (two) times daily.   Yes Historical Provider, MD  hydrochlorothiazide (HYDRODIURIL) 25 MG tablet Take 25 mg by mouth daily.   Yes Historical Provider, MD  LORazepam (ATIVAN) 0.5 MG tablet Take 1 tablet by mouth 3 (three) times daily as needed (anxiety/agitation).  07/20/15  Yes Historical Provider, MD  Multiple Vitamins-Iron (MULTIVITAMINS WITH IRON) TABS tablet Take 1 tablet by mouth daily.   Yes Historical Provider, MD  multivitamin-lutein (OCUVITE-LUTEIN) CAPS capsule Take 1 capsule by mouth daily.   Yes Historical Provider, MD  Nebivolol HCl  (BYSTOLIC) 20 MG TABS Take 20 mg by mouth 2 (two) times daily.   Yes Historical Provider, MD  Omega-3 1000 MG CAPS Take 2 capsules by mouth daily.   Yes Historical Provider, MD  ondansetron (ZOFRAN) 4 MG tablet Take 4 mg by mouth 4 (four) times daily as needed for nausea or vomiting.  09/07/15  Yes Historical Provider, MD  potassium chloride (K-DUR) 10 MEQ tablet Take 1 tablet (10 mEq total) by mouth daily. 12/04/12  Yes Kari Baars, MD  telmisartan (MICARDIS) 80 MG tablet Take 80 mg by mouth daily.   Yes Historical Provider, MD  timolol (BETIMOL) 0.5 % ophthalmic solution Place 1 drop into both eyes daily.   Yes Historical Provider, MD    Family History No family history on file.  Social History Social History  Substance Use Topics  . Smoking status: Never Smoker  . Smokeless tobacco: Never Used  . Alcohol use No     Allergies   Alendronate; Codeine; Darvocet [propoxyphene n-acetaminophen]; Demerol; Ezetimibe-simvastatin; Hydrochlorothiazide w-triamterene; Meperidine; Morphine and related; Niacin; Other; Oxybutynin chloride; Prednisone; Propoxyphene; Raloxifene; Risedronate; Simvastatin; Diltiazem hcl; Lansoprazole; Pravastatin; Sulfa antibiotics; and Teriparatide   Review of Systems Review of Systems  Constitutional: Negative for appetite change, chills, diaphoresis, fatigue and fever.  HENT: Negative for mouth sores, sore throat and trouble swallowing.   Eyes: Negative for visual disturbance.  Respiratory: Negative for cough, chest tightness, shortness of breath and wheezing.   Cardiovascular: Negative for chest pain.  Gastrointestinal: Positive for blood in stool. Negative for abdominal distention, abdominal pain, diarrhea, nausea and vomiting.  Endocrine: Negative for polydipsia, polyphagia and polyuria.  Genitourinary: Negative for dysuria, frequency and hematuria.  Musculoskeletal: Negative for gait problem.  Skin: Negative for color change, pallor and rash.  Neurological:  Negative for dizziness, syncope, light-headedness and headaches.  Hematological: Does not bruise/bleed easily.  Psychiatric/Behavioral: Negative for behavioral problems and confusion.     Physical Exam Updated Vital Signs BP 117/60   Pulse 71   Temp 97.8 F (36.6 C) (Oral)   Resp 21   Ht 5\' 5"  (1.651 m)   Wt 145 lb (65.8 kg)   SpO2 100%   BMI 24.13 kg/m   Physical Exam  Constitutional: She is oriented to person, place, and time. She appears well-developed and well-nourished. No distress.  HENT:  Head: Normocephalic.  Eyes: Conjunctivae are normal. Pupils are equal, round, and reactive to light. No scleral icterus.  Neck: Normal range of motion. Neck supple. No thyromegaly present.  Cardiovascular: Normal rate and regular rhythm.  Exam reveals no gallop and no friction rub.   No murmur heard. Pulmonary/Chest: Effort normal and breath sounds normal. No respiratory distress. She has no wheezes. She has no rales.  Abdominal: Soft. Bowel sounds are normal. She exhibits no distension. There is no tenderness. There is no rebound.  Genitourinary:  Genitourinary Comments: Large bright red blood per rectum. No obvious external hemorrhoid.  Musculoskeletal: Normal range of motion.  Neurological: She is alert and oriented to person, place, and time.  Skin: Skin is warm and dry. No rash noted.  Psychiatric: She has a normal mood and affect. Her behavior is normal.     ED Treatments / Results  Labs (all labs ordered are listed, but only abnormal results are displayed) Labs Reviewed  COMPREHENSIVE METABOLIC PANEL - Abnormal; Notable for the following:       Result Value   Sodium 133 (*)    Chloride 99 (*)    Glucose, Bld 106 (*)    Calcium 8.2 (*)    Total Protein 6.1 (*)    Albumin 3.4 (*)    Anion gap 4 (*)    All other components within normal limits  CBC - Abnormal; Notable for the following:    RBC 3.77 (*)    Hemoglobin 11.5 (*)    HCT 35.0 (*)    All other components  within normal limits  PROTIME-INR  POC OCCULT BLOOD, ED  TYPE AND SCREEN    EKG  EKG Interpretation None       Radiology No results found.  Procedures Procedures (including critical care time)  Medications Ordered in ED Medications - No data to display   Initial Impression / Assessment and Plan / ED Course  I have reviewed the triage vital signs and the nursing notes.  Pertinent labs & imaging results that were available during my care of the patient were reviewed by me and considered in my medical decision making (see chart for details).  Clinical Course    Will keep nothing by mouth. Await hemoglobin. Has undergone type and crossmatch. Will require admission for serial hemoglobins, and fusion as necessary.  Final Clinical Impressions(s) / ED Diagnoses   Final diagnoses:  Lower GI bleed    Discussed with Dr. Ardyth HarpsHernandez. Patient will be admitted.  New Prescriptions New Prescriptions   No medications on file    After patient was admitted approximately 15 minutes later a rapid response" call was made overhead and the patient's room number. I responded with nursing staff to this room. Patient had been being removed from transfer bed, to inpatient bed. She felt dizzy lightheaded complaint of nausea and urge to have a bowel movement. She had another moderate volume bloody bowel movement. She was not unconscious. She was awake and talking. This passed relatively quickly. Lip pressure 125/74, pulse 75. I discussed the case further with Dr. Ardyth HarpsHernandez the patient's inpatient physician. Blood sugar was normal. A repeat hemoglobin is been sent. I discussed case with Dr. Dionicia Ableraman the patient's GI physician who is in the hospital. He will see the patient in consult.   Rolland PorterMark Aden Sek, MD 04/09/16 [id number]    Rolland PorterMark Rheana Casebolt, MD 04/15/16 [phone]

## 2016-04-09 NOTE — ED Notes (Signed)
Pt had fourth bloody BM, small amount of brown hard stool noted.

## 2016-04-09 NOTE — ED Notes (Signed)
Terri called and left message that pt will be going to 336 and let [phone] number.

## 2016-04-09 NOTE — H&P (Signed)
History and Physical    Felicia P Retter UEA:[id number] DOB: [birth year] DOA: 04/09/2016  Referring MD/NP/PA: Rolland PorterMark James, EDP PCP: Fredirick MaudlinHAWKINS,EDWARD L, MD  Patient coming from: Assisted living facility  Chief Complaint: Bright red blood per rectum  HPI: Felicia Wells is a 82 y.o. female with history of diverticulosis with bleeding in the past, hypertension, GERD, osteoporosis who presents to the hospital with hematochezia. She apparently had one episode at her assisted living facility and became dizzy which prompted her visit to the ED. While in the emergency department proceeded to have 4 more bloody stools per RN report. While being transferred to her room upstairs a rapid response was called due to a fifth bloody stool, blood pressure of 60/30 and near syncope. IV fluids were started, blood pressure has improved to the 80s systolic. Hemoglobin was initially noted to be 11.5 and has dropped to 9.4 on last check. I believe it is probably even lower than this at present. She will be emergently transfused 2 units of PRBCs, will be transferred to the ICU, GI consultation with Dr. Karilyn Cotaehman has been requested.  Past Medical/Surgical History: Past Medical History:  Diagnosis Date  . Anxiety   . Eye problem   . GERD (gastroesophageal reflux disease)   . Hypertension   . Infections of kidney   . Osteoporosis     Past Surgical History:  Procedure Laterality Date  . brain shunt    . CATARACT EXTRACTION    . DILATION AND CURETTAGE OF UTERUS    . EYE SURGERY    . FOOT SURGERY    . HERNIA REPAIR    . THYROID SURGERY    . TONSILLECTOMY      Social History:  reports that she has never smoked. She has never used smokeless tobacco. She reports that she does not drink alcohol or use drugs.  Allergies: Allergies  Allergen Reactions  . Alendronate     Other reaction(s): Cramps (ALLERGY/intolerance)  . Codeine Nausea Only  . Darvocet [Propoxyphene N-Acetaminophen] Nausea Only  . Demerol Nausea  Only  . Ezetimibe-Simvastatin     Other reaction(s): Other (See Comments) numbness  . Hydrochlorothiazide W-Triamterene Nausea And Vomiting  . Meperidine Nausea And Vomiting  . Morphine And Related     opiods  . Niacin Nausea Only    bloat  . Other Other (See Comments)    Butazolidin Derm - swelling Sensitive to Cholesterol medications and blood pressure medications , possible fluid retention  . Oxybutynin Chloride     Other reaction(s): Other (See Comments) Forgetful and disorriented  . Prednisone Other (See Comments)    disoriented  . Propoxyphene Nausea And Vomiting  . Raloxifene     Other reaction(s): Cramps (ALLERGY/intolerance)  . Risedronate Other (See Comments)    Other reaction(s): Dizziness (intolerance) Affects pulse  . Simvastatin Other (See Comments)    Bloat  . Diltiazem Hcl Rash    Derm  . Lansoprazole Rash    Derm  . Pravastatin Rash  . Sulfa Antibiotics Rash  . Teriparatide Other (See Comments) and Palpitations    Family History:  No history of stroke or heart disease in family members  Prior to Admission medications   Medication Sig Start Date End Date Taking? Authorizing Provider  albuterol (PROVENTIL HFA;VENTOLIN HFA) 108 (90 BASE) MCG/ACT inhaler Inhale 1-2 puffs into the lungs every 6 (six) hours as needed for wheezing or shortness of breath. 09/13/13  Yes Donnetta HutchingBrian Cook, MD  Artificial Tear Solution (SOOTHE XP OP) Apply 2  drops to eye 4 (four) times daily. Instil one drop in each eye   Yes Historical Provider, MD  aspirin EC 81 MG tablet Take 81 mg by mouth daily.   Yes Historical Provider, MD  cholecalciferol (VITAMIN D) 1000 UNITS tablet Take 500 Units by mouth 2 (two) times daily.   Yes Historical Provider, MD  diazepam (VALIUM) 2 MG tablet Take 2 mg by mouth 2 (two) times daily as needed (meniere's disease).  07/20/15  Yes Historical Provider, MD  famotidine (PEPCID) 20 MG tablet Take 1 tablet (20 mg total) by mouth 2 (two) times daily. 09/14/15  Yes  Avon Gully, MD  guaiFENesin-dextromethorphan (ROBITUSSIN DM) 100-10 MG/5ML syrup Take 5 mLs by mouth every 4 (four) hours as needed for cough.   Yes Historical Provider, MD  hydrALAZINE (APRESOLINE) 10 MG tablet Take 10 mg by mouth 2 (two) times daily.   Yes Historical Provider, MD  hydrochlorothiazide (HYDRODIURIL) 25 MG tablet Take 25 mg by mouth daily.   Yes Historical Provider, MD  LORazepam (ATIVAN) 0.5 MG tablet Take 1 tablet by mouth 3 (three) times daily as needed (anxiety/agitation).  07/20/15  Yes Historical Provider, MD  Multiple Vitamins-Iron (MULTIVITAMINS WITH IRON) TABS tablet Take 1 tablet by mouth daily.   Yes Historical Provider, MD  multivitamin-lutein (OCUVITE-LUTEIN) CAPS capsule Take 1 capsule by mouth daily.   Yes Historical Provider, MD  Nebivolol HCl (BYSTOLIC) 20 MG TABS Take 20 mg by mouth 2 (two) times daily.   Yes Historical Provider, MD  Omega-3 1000 MG CAPS Take 2 capsules by mouth daily.   Yes Historical Provider, MD  ondansetron (ZOFRAN) 4 MG tablet Take 4 mg by mouth 4 (four) times daily as needed for nausea or vomiting.  09/07/15  Yes Historical Provider, MD  potassium chloride (K-DUR) 10 MEQ tablet Take 1 tablet (10 mEq total) by mouth daily. 12/04/12  Yes Kari Baars, MD  telmisartan (MICARDIS) 80 MG tablet Take 80 mg by mouth daily.   Yes Historical Provider, MD  timolol (BETIMOL) 0.5 % ophthalmic solution Place 1 drop into both eyes daily.   Yes Historical Provider, MD    Review of Systems:  Constitutional: Denies fever, chills, diaphoresis, appetite change Positive for fatigue.  HEENT: Denies photophobia, eye pain, redness, hearing loss, ear pain, congestion, sore throat, rhinorrhea, sneezing, mouth sores, trouble swallowing, neck pain, neck stiffness and tinnitus.   Respiratory: Denies SOB, DOE, cough, chest tightness,  and wheezing.   Cardiovascular: Denies chest pain, palpitations and leg swelling.  Gastrointestinal: Denies vomiting, abdominal pain,  diarrhea,  and abdominal distention.  Genitourinary: Denies dysuria, urgency, frequency, hematuria, flank pain and difficulty urinating.  Endocrine: Denies: hot or cold intolerance, sweats, changes in hair or nails, polyuria, polydipsia. Musculoskeletal: Denies myalgias, back pain, joint swelling, arthralgias and gait problem.  Skin: Denies pallor, rash and wound.  Neurological: Denies  seizures, numbness and headaches.  Hematological: Denies adenopathy. Easy bruising, personal or family bleeding history  Psychiatric/Behavioral: Denies suicidal ideation, mood changes, confusion, nervousness, sleep disturbance and agitation    Physical Exam: Vitals:   04/09/16 1100 04/09/16 1118 04/09/16 1220 04/09/16 1332  BP: 96/61 115/81 116/90 (!) 86/61  Pulse: 72 61 62 (!) 55  Resp: 22 22    Temp:      TempSrc:      SpO2: 100% 97% 98%   Weight:      Height:         Constitutional: NAD, calm, comfortable,Very pale Eyes: PERRL, lids and conjunctivae pale ENMT:  Mucous membranes are dry. Posterior pharynx clear of any exudate or lesions. Neck: normal, supple, no masses, no thyromegaly Respiratory: clear to auscultation bilaterally, no wheezing, no crackles. Normal respiratory effort. No accessory muscle use.  Cardiovascular: Regular rate and rhythm, no murmurs / rubs / gallops. No extremity edema. 2+ pedal pulses. No carotid bruits.  Abdomen: no tenderness, no masses palpated. No hepatosplenomegaly. Bowel sounds positive.  Musculoskeletal: no clubbing / cyanosis. No joint deformity upper and lower extremities. Good ROM, no contractures. Normal muscle tone.  Skin: no rashes, lesions, ulcers. No induration Neurologic: CN 2-12 grossly intact. Sensation intact, DTR normal. Strength 5/5 in all 4.  Psychiatric: Normal judgment and insight. Alert and oriented x 3. Normal mood.    Labs on Admission: I have personally reviewed the following labs and imaging studies  CBC:  Recent Labs Lab  04/09/16 0730 04/09/16 1251  WBC 8.3  --   HGB 11.5* 9.4*  HCT 35.0* 28.3*  MCV 92.8  --   PLT 268  --    Basic Metabolic Panel:  Recent Labs Lab 04/09/16 0730  NA 133*  K 3.8  CL 99*  CO2 30  GLUCOSE 106*  BUN 19  CREATININE 0.71  CALCIUM 8.2*   GFR: Estimated Creatinine Clearance: 37.9 mL/min (by C-G formula based on SCr of 0.8 mg/dL). Liver Function Tests:  Recent Labs Lab 04/09/16 0730  AST 21  ALT 14  ALKPHOS 64  BILITOT 0.5  PROT 6.1*  ALBUMIN 3.4*   No results for input(s): LIPASE, AMYLASE in the last 168 hours. No results for input(s): AMMONIA in the last 168 hours. Coagulation Profile:  Recent Labs Lab 04/09/16 0730  INR 1.07   Cardiac Enzymes: No results for input(s): CKTOTAL, CKMB, CKMBINDEX, TROPONINI in the last 168 hours. BNP (last 3 results) No results for input(s): PROBNP in the last 8760 hours. HbA1C: No results for input(s): HGBA1C in the last 72 hours. CBG: No results for input(s): GLUCAP in the last 168 hours. Lipid Profile: No results for input(s): CHOL, HDL, LDLCALC, TRIG, CHOLHDL, LDLDIRECT in the last 72 hours. Thyroid Function Tests: No results for input(s): TSH, T4TOTAL, FREET4, T3FREE, THYROIDAB in the last 72 hours. Anemia Panel: No results for input(s): VITAMINB12, FOLATE, FERRITIN, TIBC, IRON, RETICCTPCT in the last 72 hours. Urine analysis:    Component Value Date/Time   COLORURINE YELLOW 07/14/2013 1910   APPEARANCEUR CLEAR 07/14/2013 1910   LABSPEC 1.025 07/14/2013 1910   PHURINE 5.5 07/14/2013 1910   GLUCOSEU NEGATIVE 07/14/2013 1910   HGBUR NEGATIVE 07/14/2013 1910   BILIRUBINUR NEGATIVE 07/14/2013 1910   KETONESUR NEGATIVE 07/14/2013 1910   PROTEINUR NEGATIVE 07/14/2013 1910   UROBILINOGEN 0.2 07/14/2013 1910   NITRITE NEGATIVE 07/14/2013 1910   LEUKOCYTESUR MODERATE (A) 07/14/2013 1910   Sepsis Labs: (procalcitonin:4,lacticidven:4) )No results found for this or any previous visit (from the  past 240 hour(s)).   Radiological Exams on Admission: No results found.  EKG: Independently reviewed. None obtained in ED  Assessment/Plan Principal Problem:   Lower GI bleed Active Problems:   Hematochezia   Diverticulosis large intestine w/o perforation or abscess w/bleeding    Lower GI bleed/hematochezia -Likely due to diverticular disease as noted in the past. -She is currently hemodynamically unstable. Will be transferred to the ICU, normal saline boluses have been ordered as well as maintenance fluids, have ordered stat transfusion of 2 units, will have 2 extra units on hold. -GI consultation has been requested.   DVT prophylaxis: SCDs  Code Status:  DO NOT RESUSCITATE  Family Communication: Discussed with daughter-in-law Aurther Loft at bedside  Disposition Plan: Admit to ICU  Consults called: Gastroenterology  Admission status: Inpatient    Time Spent: 85 minutes  Chaya Jan MD [facility] Pager [phone]  If 7PM-7AM, please contact night-coverage [email or web address] Password [email or web address]  04/09/2016, 1:38 PM

## 2016-04-10 LAB — CBC
HCT: 31.8 % — ABNORMAL LOW (ref 36.0–46.0)
HEMATOCRIT: 31.4 % — AB (ref 36.0–46.0)
Hemoglobin: 10.8 g/dL — ABNORMAL LOW (ref 12.0–15.0)
Hemoglobin: 10.6 g/dL — ABNORMAL LOW (ref 12.0–15.0)
MCH: 31.7 pg (ref 26.0–34.0)
MCH: 31.5 pg (ref 26.0–34.0)
MCHC: 34.4 g/dL (ref 30.0–36.0)
MCHC: 33.3 g/dL (ref 30.0–36.0)
MCV: 92.1 fL (ref 78.0–100.0)
MCV: 94.4 fL (ref 78.0–100.0)
PLATELETS: 172 10*3/uL (ref 150–400)
PLATELETS: 175 10*3/uL (ref 150–400)
RBC: 3.41 MIL/uL — ABNORMAL LOW (ref 3.87–5.11)
RBC: 3.37 MIL/uL — ABNORMAL LOW (ref 3.87–5.11)
RDW: 14.8 % (ref 11.5–15.5)
RDW: 15.3 % (ref 11.5–15.5)
WBC: 10.1 10*3/uL (ref 4.0–10.5)
WBC: 9 10*3/uL (ref 4.0–10.5)

## 2016-04-10 LAB — BASIC METABOLIC PANEL
ANION GAP: 2 — AB (ref 5–15)
BUN: 17 mg/dL (ref 6–20)
CO2: 29 mmol/L (ref 22–32)
CREATININE: 0.68 mg/dL (ref 0.44–1.00)
Calcium: 7.7 mg/dL — ABNORMAL LOW (ref 8.9–10.3)
Chloride: 105 mmol/L (ref 101–111)
GFR calc non Af Amer: >60 mL/min (ref >60)
GLUCOSE: 100 mg/dL — AB (ref 65–99)
Potassium: 3.6 mmol/L (ref 3.5–5.1)
SODIUM: 136 mmol/L (ref 135–145)

## 2016-04-10 LAB — GLUCOSE, CAPILLARY: GLUCOSE-CAPILLARY: 149 mg/dL — AB (ref 65–99)

## 2016-04-10 NOTE — Progress Notes (Addendum)
Patient ID: Felicia GasserVirginia P Wells, female   DOB: [birth year], 82 y.o.   MRN: [id number] Voices no complaints this am. No BM during the night. No abdominal pain.  Vitals:   04/10/16 1000 04/10/16 1100  BP: (!) 106/58 (!) 111/58  Pulse:    Resp: 20 16  Temp:       CBC Latest Ref Rng & Units 04/10/2016 04/10/2016 04/09/2016  WBC 4.0 - 10.5 K/uL 9.0 10.1 15.9(H)  Hemoglobin 12.0 - 15.0 g/dL 10.6(L) 10.8(L) 11.5(L)  Hematocrit 36.0 - 46.0 % 31.8(L) 31.4(L) 33.7(L)  Platelets 150 - 400 K/uL 175 172 183   Assessment: probable diverticular bleed. No BM during the night. Slight drop in Hemoglobin. Will continue to monitor.    GI attending note:  Patient has not had a bowel movement in almost 30 hours. It appears she has stopped bleeding. Hemoglobin low but stable after 2 units of PRBCs. Diet advanced to heart healthy diet. No plans for intervention at this time.

## 2016-04-10 NOTE — Care Management Note (Signed)
Case Management Note  Patient Details  Name: Felicia Wells MRN: [id number] Date of Birth: [birth year]  Subjective/Objective:                  Pt admitted with GIB. Pt is from [facility] [place]. Anticipate pt will return to [facility] at DC. CSW is aware and will make arrangements for return to facility.  Action/Plan: No CM needs anticipated, will cont to follow.   Expected Discharge Date:     04/12/2016             Expected Discharge Plan:  Assisted Living / Rest Home  In-House Referral:  Clinical Social Work  Discharge planning Services  CM Consult  Post Acute Care Choice:  NA Choice offered to:  NA  DME Arranged:    DME Agency:     HH Arranged:    HH Agency:     Status of Service:  In process, will continue to follow  If discussed at Long Length of Stay Meetings, dates discussed:    Additional Comments:  Malcolm MetroChildress, Ashante Yellin Demske, RN 04/10/2016, 2:40 PM

## 2016-04-10 NOTE — Progress Notes (Signed)
Subjective: She was admitted yesterday with presumed diverticular bleeding. It seems that the bleeding has stopped.  Objective: Vital signs in last 24 hours: Temp:  [97 F (36.1 C)-97.9 F (36.6 C)] 97.1 F (36.2 C) (08/10 0716) Pulse Rate:  [25-95] 58 (08/09 1715) Resp:  [15-22] 20 (08/09 1800) BP: (86-120)/(40-99) 95/40 (08/09 1938) SpO2:  [89 %-100 %] 99 % (08/09 1700) Weight:  [59.2 kg (130 lb 8.2 oz)] 59.2 kg (130 lb 8.2 oz) (08/10 0442) Weight change:  Last BM Date: 04/09/16  Intake/Output from previous day: 08/09 0701 - 08/10 0700 In: 1471 [I.V.:880; Blood:591] Out: -   PHYSICAL EXAM General appearance: alert, cooperative, no distress and Mildly confused Resp: clear to auscultation bilaterally Cardio: regular rate and rhythm, S1, S2 normal, no murmur, click, rub or gallop GI: soft, non-tender; bowel sounds normal; no masses,  no organomegaly Extremities: extremities normal, atraumatic, no cyanosis or edema  Lab Results:  Results for orders placed or performed during the hospital encounter of 04/09/16 (from the past 48 hour(s))  Comprehensive metabolic panel     Status: Abnormal   Collection Time: 04/09/16  7:30 AM  Result Value Ref Range   Sodium 133 (L) 135 - 145 mmol/L   Potassium 3.8 3.5 - 5.1 mmol/L   Chloride 99 (L) 101 - 111 mmol/L   CO2 30 22 - 32 mmol/L   Glucose, Bld 106 (H) 65 - 99 mg/dL   BUN 19 6 - 20 mg/dL   Creatinine, Ser 0.71 0.44 - 1.00 mg/dL   Calcium 8.2 (L) 8.9 - 10.3 mg/dL   Total Protein 6.1 (L) 6.5 - 8.1 g/dL   Albumin 3.4 (L) 3.5 - 5.0 g/dL   AST 21 15 - 41 U/L   ALT 14 14 - 54 U/L   Alkaline Phosphatase 64 38 - 126 U/L   Total Bilirubin 0.5 0.3 - 1.2 mg/dL   GFR calc non Af Amer >60 >60 mL/min   GFR calc Af Amer >60 >60 mL/min    Comment: (NOTE) The eGFR has been calculated using the CKD EPI equation. This calculation has not been validated in all clinical situations. eGFR's persistently <60 mL/min signify possible Chronic  Kidney Disease.    Anion gap 4 (L) 5 - 15  CBC     Status: Abnormal   Collection Time: 04/09/16  7:30 AM  Result Value Ref Range   WBC 8.3 4.0 - 10.5 K/uL   RBC 3.77 (L) 3.87 - 5.11 MIL/uL   Hemoglobin 11.5 (L) 12.0 - 15.0 g/dL   HCT 35.0 (L) 36.0 - 46.0 %   MCV 92.8 78.0 - 100.0 fL   MCH 30.5 26.0 - 34.0 pg   MCHC 32.9 30.0 - 36.0 g/dL   RDW 15.0 11.5 - 15.5 %   Platelets 268 150 - 400 K/uL  Type and screen [facility]     Status: None (Preliminary result)   Collection Time: 04/09/16  7:30 AM  Result Value Ref Range   ABO/RH(D) O POS    Antibody Screen NEG    Sample Expiration 04/12/2016    Unit Number [id number]    Blood Component Type RBC LR PHER1    Unit division 00    Status of Unit ALLOCATED    Transfusion Status OK TO TRANSFUSE    Crossmatch Result Compatible    Unit Number [id number]    Blood Component Type RED CELLS,LR    Unit division 00    Status of Unit ISSUED  Transfusion Status OK TO TRANSFUSE    Crossmatch Result Compatible    Unit Number [id number]    Blood Component Type RED CELLS,LR    Unit division 00    Status of Unit ISSUED    Transfusion Status OK TO TRANSFUSE    Crossmatch Result Compatible   Protime-INR     Status: None   Collection Time: 04/09/16  7:30 AM  Result Value Ref Range   Prothrombin Time 13.9 11.4 - 15.2 seconds   INR 1.07   Prepare RBC     Status: None   Collection Time: 04/09/16  7:30 AM  Result Value Ref Range   Order Confirmation ORDER PROCESSED BY BLOOD BANK   Hemoglobin and hematocrit, blood     Status: Abnormal   Collection Time: 04/09/16 12:51 PM  Result Value Ref Range   Hemoglobin 9.4 (L) 12.0 - 15.0 g/dL   HCT 28.3 (L) 36.0 - 46.0 %  CBC     Status: Abnormal   Collection Time: 04/09/16  2:01 PM  Result Value Ref Range   WBC 16.6 (H) 4.0 - 10.5 K/uL   RBC 2.94 (L) 3.87 - 5.11 MIL/uL   Hemoglobin 9.0 (L) 12.0 - 15.0 g/dL   HCT 27.6 (L) 36.0 - 46.0 %   MCV 93.9 78.0 - 100.0 fL   MCH 30.6 26.0 -  34.0 pg   MCHC 32.6 30.0 - 36.0 g/dL   RDW 15.0 11.5 - 15.5 %   Platelets 236 150 - 400 K/uL  Protime-INR     Status: Abnormal   Collection Time: 04/09/16  2:01 PM  Result Value Ref Range   Prothrombin Time 15.5 (H) 11.4 - 15.2 seconds   INR 1.22   APTT     Status: None   Collection Time: 04/09/16  2:01 PM  Result Value Ref Range   aPTT 26 24 - 36 seconds  MRSA PCR Screening     Status: None   Collection Time: 04/09/16  2:50 PM  Result Value Ref Range   MRSA by PCR NEGATIVE NEGATIVE    Comment:        The GeneXpert MRSA Assay (FDA approved for NASAL specimens only), is one component of a comprehensive MRSA colonization surveillance program. It is not intended to diagnose MRSA infection nor to guide or monitor treatment for MRSA infections.   CBC     Status: Abnormal   Collection Time: 04/09/16  8:55 PM  Result Value Ref Range   WBC 15.9 (H) 4.0 - 10.5 K/uL   RBC 3.64 (L) 3.87 - 5.11 MIL/uL   Hemoglobin 11.5 (L) 12.0 - 15.0 g/dL    Comment: DELTA CHECK NOTED POST TRANSFUSION SPECIMEN    HCT 33.7 (L) 36.0 - 46.0 %   MCV 92.6 78.0 - 100.0 fL   MCH 31.6 26.0 - 34.0 pg   MCHC 34.1 30.0 - 36.0 g/dL   RDW 14.4 11.5 - 15.5 %   Platelets 183 150 - 400 K/uL  Basic metabolic panel     Status: Abnormal   Collection Time: 04/10/16  3:07 AM  Result Value Ref Range   Sodium 136 135 - 145 mmol/L   Potassium 3.6 3.5 - 5.1 mmol/L   Chloride 105 101 - 111 mmol/L   CO2 29 22 - 32 mmol/L   Glucose, Bld 100 (H) 65 - 99 mg/dL   BUN 17 6 - 20 mg/dL   Creatinine, Ser 0.68 0.44 - 1.00 mg/dL   Calcium 7.7 (L) 8.9 -  10.3 mg/dL   GFR calc non Af Amer >60 >60 mL/min   GFR calc Af Amer >60 >60 mL/min    Comment: (NOTE) The eGFR has been calculated using the CKD EPI equation. This calculation has not been validated in all clinical situations. eGFR's persistently <60 mL/min signify possible Chronic Kidney Disease.    Anion gap 2 (L) 5 - 15  CBC     Status: Abnormal   Collection Time:  04/10/16  3:07 AM  Result Value Ref Range   WBC 10.1 4.0 - 10.5 K/uL   RBC 3.41 (L) 3.87 - 5.11 MIL/uL   Hemoglobin 10.8 (L) 12.0 - 15.0 g/dL   HCT 31.4 (L) 36.0 - 46.0 %   MCV 92.1 78.0 - 100.0 fL   MCH 31.7 26.0 - 34.0 pg   MCHC 34.4 30.0 - 36.0 g/dL   RDW 14.8 11.5 - 15.5 %   Platelets 172 150 - 400 K/uL    ABGS No results for input(s): PHART, PO2ART, TCO2, HCO3 in the last 72 hours.  Invalid input(s): PCO2 CULTURES Recent Results (from the past 240 hour(s))  MRSA PCR Screening     Status: None   Collection Time: 04/09/16  2:50 PM  Result Value Ref Range Status   MRSA by PCR NEGATIVE NEGATIVE Final    Comment:        The GeneXpert MRSA Assay (FDA approved for NASAL specimens only), is one component of a comprehensive MRSA colonization surveillance program. It is not intended to diagnose MRSA infection nor to guide or monitor treatment for MRSA infections.    Studies/Results: No results found.  Medications:  Prior to Admission:  Prescriptions Prior to Admission  Medication Sig Dispense Refill Last Dose  . albuterol (PROVENTIL HFA;VENTOLIN HFA) 108 (90 BASE) MCG/ACT inhaler Inhale 1-2 puffs into the lungs every 6 (six) hours as needed for wheezing or shortness of breath. 1 Inhaler 0 unknown  . Artificial Tear Solution (SOOTHE XP OP) Apply 2 drops to eye 4 (four) times daily. Instil one drop in each eye   04/08/2016 at Unknown time  . aspirin EC 81 MG tablet Take 81 mg by mouth daily.   04/08/2016 at Unknown time  . cholecalciferol (VITAMIN D) 1000 UNITS tablet Take 500 Units by mouth 2 (two) times daily.   04/08/2016 at Unknown time  . diazepam (VALIUM) 2 MG tablet Take 2 mg by mouth 2 (two) times daily as needed (meniere's disease).    unknown  . famotidine (PEPCID) 20 MG tablet Take 1 tablet (20 mg total) by mouth 2 (two) times daily. 60 tablet 3 04/08/2016 at Unknown time  . guaiFENesin-dextromethorphan (ROBITUSSIN DM) 100-10 MG/5ML syrup Take 5 mLs by mouth every 4 (four)  hours as needed for cough.   unknown  . hydrALAZINE (APRESOLINE) 10 MG tablet Take 10 mg by mouth 2 (two) times daily.   04/08/2016 at Unknown time  . hydrochlorothiazide (HYDRODIURIL) 25 MG tablet Take 25 mg by mouth daily.   04/08/2016 at Unknown time  . LORazepam (ATIVAN) 0.5 MG tablet Take 1 tablet by mouth 3 (three) times daily as needed (anxiety/agitation).    unknown  . Multiple Vitamins-Iron (MULTIVITAMINS WITH IRON) TABS tablet Take 1 tablet by mouth daily.   04/08/2016 at Unknown time  . multivitamin-lutein (OCUVITE-LUTEIN) CAPS capsule Take 1 capsule by mouth daily.   04/08/2016 at Unknown time  . Nebivolol HCl (BYSTOLIC) 20 MG TABS Take 20 mg by mouth 2 (two) times daily.   04/08/2016 at Unknown  time  . Omega-3 1000 MG CAPS Take 2 capsules by mouth daily.   04/08/2016 at Unknown time  . ondansetron (ZOFRAN) 4 MG tablet Take 4 mg by mouth 4 (four) times daily as needed for nausea or vomiting.    unknown at Unknown time  . potassium chloride (K-DUR) 10 MEQ tablet Take 1 tablet (10 mEq total) by mouth daily. 30 tablet 12 04/08/2016 at Unknown time  . telmisartan (MICARDIS) 80 MG tablet Take 80 mg by mouth daily.   04/08/2016 at Unknown time  . timolol (BETIMOL) 0.5 % ophthalmic solution Place 1 drop into both eyes daily.   04/08/2016 at Unknown time   Scheduled:  Continuous: . sodium chloride 100 mL/hr at 04/10/16 6381   RRN:[id number] **OR** ondansetron (ZOFRAN) IV  Assesment:She has lower GI bleeding which I think has stopped. She has hypertension and that's doing okay.  She has dementia at baseline which is stable Principal Problem:   Lower GI bleed Active Problems:   Hematochezia   Diverticulosis large intestine w/o perforation or abscess w/bleeding    Plan: Continue to watch hemoglobin and hematocrit. GI consultation noted and appreciated.    LOS: 1 day   Ashima Shrake L 04/10/2016, 8:24 AM

## 2016-04-11 DIAGNOSIS — F0391 Unspecified dementia with behavioral disturbance: Secondary | ICD-10-CM | POA: Diagnosis present

## 2016-04-11 DIAGNOSIS — F03918 Unspecified dementia, unspecified severity, with other behavioral disturbance: Secondary | ICD-10-CM | POA: Diagnosis present

## 2016-04-11 LAB — CBC WITH DIFFERENTIAL/PLATELET
BASOS PCT: 1 %
Basophils Absolute: 0.1 10*3/uL (ref 0.0–0.1)
Eosinophils Absolute: 0.2 10*3/uL (ref 0.0–0.7)
Eosinophils Relative: 1 %
HEMATOCRIT: 33.5 % — AB (ref 36.0–46.0)
HEMOGLOBIN: 10.7 g/dL — AB (ref 12.0–15.0)
LYMPHS PCT: 14 %
Lymphs Abs: 1.4 10*3/uL (ref 0.7–4.0)
MCH: 30.8 pg (ref 26.0–34.0)
MCHC: 31.9 g/dL (ref 30.0–36.0)
MCV: 96.5 fL (ref 78.0–100.0)
MONO ABS: 0.9 10*3/uL (ref 0.1–1.0)
MONOS PCT: 8 %
NEUTROS ABS: 7.9 10*3/uL — AB (ref 1.7–7.7)
NEUTROS PCT: 76 %
Platelets: 179 10*3/uL (ref 150–400)
RBC: 3.47 MIL/uL — ABNORMAL LOW (ref 3.87–5.11)
RDW: 15.3 % (ref 11.5–15.5)
WBC: 10.4 10*3/uL (ref 4.0–10.5)

## 2016-04-11 NOTE — Progress Notes (Signed)
Subjective: She is doing well. She has no new complaints. She has not had any more bleeding. CBC is pending  Objective: Vital signs in last 24 hours: Temp:  [97 F (36.1 C)-98.6 F (37 C)] 98.4 F (36.9 C) (08/11 0754) Resp:  [14-21] 21 (08/11 0400) BP: (95-131)/(46-112) 131/112 (08/11 0100) Weight:  [61.9 kg (136 lb 7.4 oz)] 61.9 kg (136 lb 7.4 oz) (08/11 0500) Weight change: -3.871 kg (-8 lb 8.6 oz) Last BM Date: 04/09/16  Intake/Output from previous day: 08/10 0701 - 08/11 0700 In: 3970 [P.O.:820; I.V.:3150] Out: 350 [Urine:350]  PHYSICAL EXAM General appearance: alert, cooperative and no distress Resp: clear to auscultation bilaterally Cardio: regular rate and rhythm, S1, S2 normal, no murmur, click, rub or gallop GI: soft, non-tender; bowel sounds normal; no masses,  no organomegaly Extremities: extremities normal, atraumatic, no cyanosis or edema  Lab Results:  Results for orders placed or performed during the hospital encounter of 04/09/16 (from the past 48 hour(s))  Glucose, capillary     Status: Abnormal   Collection Time: 04/09/16 12:44 PM  Result Value Ref Range   Glucose-Capillary 149 (H) 65 - 99 mg/dL  Hemoglobin and hematocrit, blood     Status: Abnormal   Collection Time: 04/09/16 12:51 PM  Result Value Ref Range   Hemoglobin 9.4 (L) 12.0 - 15.0 g/dL   HCT 28.3 (L) 36.0 - 46.0 %  CBC     Status: Abnormal   Collection Time: 04/09/16  2:01 PM  Result Value Ref Range   WBC 16.6 (H) 4.0 - 10.5 K/uL   RBC 2.94 (L) 3.87 - 5.11 MIL/uL   Hemoglobin 9.0 (L) 12.0 - 15.0 g/dL   HCT 27.6 (L) 36.0 - 46.0 %   MCV 93.9 78.0 - 100.0 fL   MCH 30.6 26.0 - 34.0 pg   MCHC 32.6 30.0 - 36.0 g/dL   RDW 15.0 11.5 - 15.5 %   Platelets 236 150 - 400 K/uL  Protime-INR     Status: Abnormal   Collection Time: 04/09/16  2:01 PM  Result Value Ref Range   Prothrombin Time 15.5 (H) 11.4 - 15.2 seconds   INR 1.22   APTT     Status: None   Collection Time: 04/09/16  2:01 PM   Result Value Ref Range   aPTT 26 24 - 36 seconds  MRSA PCR Screening     Status: None   Collection Time: 04/09/16  2:50 PM  Result Value Ref Range   MRSA by PCR NEGATIVE NEGATIVE    Comment:        The GeneXpert MRSA Assay (FDA approved for NASAL specimens only), is one component of a comprehensive MRSA colonization surveillance program. It is not intended to diagnose MRSA infection nor to guide or monitor treatment for MRSA infections.   CBC     Status: Abnormal   Collection Time: 04/09/16  8:55 PM  Result Value Ref Range   WBC 15.9 (H) 4.0 - 10.5 K/uL   RBC 3.64 (L) 3.87 - 5.11 MIL/uL   Hemoglobin 11.5 (L) 12.0 - 15.0 g/dL    Comment: DELTA CHECK NOTED POST TRANSFUSION SPECIMEN    HCT 33.7 (L) 36.0 - 46.0 %   MCV 92.6 78.0 - 100.0 fL   MCH 31.6 26.0 - 34.0 pg   MCHC 34.1 30.0 - 36.0 g/dL   RDW 14.4 11.5 - 15.5 %   Platelets 183 150 - 400 K/uL  Basic metabolic panel     Status: Abnormal  Collection Time: 04/10/16  3:07 AM  Result Value Ref Range   Sodium 136 135 - 145 mmol/L   Potassium 3.6 3.5 - 5.1 mmol/L   Chloride 105 101 - 111 mmol/L   CO2 29 22 - 32 mmol/L   Glucose, Bld 100 (H) 65 - 99 mg/dL   BUN 17 6 - 20 mg/dL   Creatinine, Ser 0.68 0.44 - 1.00 mg/dL   Calcium 7.7 (L) 8.9 - 10.3 mg/dL   GFR calc non Af Amer >60 >60 mL/min   GFR calc Af Amer >60 >60 mL/min    Comment: (NOTE) The eGFR has been calculated using the CKD EPI equation. This calculation has not been validated in all clinical situations. eGFR's persistently <60 mL/min signify possible Chronic Kidney Disease.    Anion gap 2 (L) 5 - 15  CBC     Status: Abnormal   Collection Time: 04/10/16  3:07 AM  Result Value Ref Range   WBC 10.1 4.0 - 10.5 K/uL   RBC 3.41 (L) 3.87 - 5.11 MIL/uL   Hemoglobin 10.8 (L) 12.0 - 15.0 g/dL   HCT 31.4 (L) 36.0 - 46.0 %   MCV 92.1 78.0 - 100.0 fL   MCH 31.7 26.0 - 34.0 pg   MCHC 34.4 30.0 - 36.0 g/dL   RDW 14.8 11.5 - 15.5 %   Platelets 172 150 - 400 K/uL   CBC     Status: Abnormal   Collection Time: 04/10/16  9:59 AM  Result Value Ref Range   WBC 9.0 4.0 - 10.5 K/uL   RBC 3.37 (L) 3.87 - 5.11 MIL/uL   Hemoglobin 10.6 (L) 12.0 - 15.0 g/dL   HCT 31.8 (L) 36.0 - 46.0 %   MCV 94.4 78.0 - 100.0 fL   MCH 31.5 26.0 - 34.0 pg   MCHC 33.3 30.0 - 36.0 g/dL   RDW 15.3 11.5 - 15.5 %   Platelets 175 150 - 400 K/uL    ABGS No results for input(s): PHART, PO2ART, TCO2, HCO3 in the last 72 hours.  Invalid input(s): PCO2 CULTURES Recent Results (from the past 240 hour(s))  MRSA PCR Screening     Status: None   Collection Time: 04/09/16  2:50 PM  Result Value Ref Range Status   MRSA by PCR NEGATIVE NEGATIVE Final    Comment:        The GeneXpert MRSA Assay (FDA approved for NASAL specimens only), is one component of a comprehensive MRSA colonization surveillance program. It is not intended to diagnose MRSA infection nor to guide or monitor treatment for MRSA infections.    Studies/Results: No results found.  Medications:  Prior to Admission:  Prescriptions Prior to Admission  Medication Sig Dispense Refill Last Dose  . albuterol (PROVENTIL HFA;VENTOLIN HFA) 108 (90 BASE) MCG/ACT inhaler Inhale 1-2 puffs into the lungs every 6 (six) hours as needed for wheezing or shortness of breath. 1 Inhaler 0 unknown  . Artificial Tear Solution (SOOTHE XP OP) Apply 2 drops to eye 4 (four) times daily. Instil one drop in each eye   04/08/2016 at Unknown time  . aspirin EC 81 MG tablet Take 81 mg by mouth daily.   04/08/2016 at Unknown time  . cholecalciferol (VITAMIN D) 1000 UNITS tablet Take 500 Units by mouth 2 (two) times daily.   04/08/2016 at Unknown time  . diazepam (VALIUM) 2 MG tablet Take 2 mg by mouth 2 (two) times daily as needed (meniere's disease).    unknown  . famotidine (  PEPCID) 20 MG tablet Take 1 tablet (20 mg total) by mouth 2 (two) times daily. 60 tablet 3 04/08/2016 at Unknown time  . guaiFENesin-dextromethorphan (ROBITUSSIN DM) 100-10  MG/5ML syrup Take 5 mLs by mouth every 4 (four) hours as needed for cough.   unknown  . hydrALAZINE (APRESOLINE) 10 MG tablet Take 10 mg by mouth 2 (two) times daily.   04/08/2016 at Unknown time  . hydrochlorothiazide (HYDRODIURIL) 25 MG tablet Take 25 mg by mouth daily.   04/08/2016 at Unknown time  . LORazepam (ATIVAN) 0.5 MG tablet Take 1 tablet by mouth 3 (three) times daily as needed (anxiety/agitation).    unknown  . Multiple Vitamins-Iron (MULTIVITAMINS WITH IRON) TABS tablet Take 1 tablet by mouth daily.   04/08/2016 at Unknown time  . multivitamin-lutein (OCUVITE-LUTEIN) CAPS capsule Take 1 capsule by mouth daily.   04/08/2016 at Unknown time  . Nebivolol HCl (BYSTOLIC) 20 MG TABS Take 20 mg by mouth 2 (two) times daily.   04/08/2016 at Unknown time  . Omega-3 1000 MG CAPS Take 2 capsules by mouth daily.   04/08/2016 at Unknown time  . ondansetron (ZOFRAN) 4 MG tablet Take 4 mg by mouth 4 (four) times daily as needed for nausea or vomiting.    unknown at Unknown time  . potassium chloride (K-DUR) 10 MEQ tablet Take 1 tablet (10 mEq total) by mouth daily. 30 tablet 12 04/08/2016 at Unknown time  . telmisartan (MICARDIS) 80 MG tablet Take 80 mg by mouth daily.   04/08/2016 at Unknown time  . timolol (BETIMOL) 0.5 % ophthalmic solution Place 1 drop into both eyes daily.   04/08/2016 at Unknown time   Scheduled:  Continuous: . sodium chloride 100 mL/hr at 04/11/16 0035   FGB:[id number] **OR** ondansetron (ZOFRAN) IV  Assesment:She was admitted with lower GI bleed. She has had a previous episode of this and it is felt to be related to diverticulosis. She has DO NOT RESUSCITATE status and her family does not want to pursue aggressive treatment. She did receive 2 units of packed red blood cells. She appears to have stopped bleeding.  She has hypertension which has been pretty difficult to control because of medication intolerances but still doing okay now.  She has dementia at baseline and that's about the  same. Principal Problem:   Lower GI bleed Active Problems:   Hematochezia   Diverticulosis large intestine w/o perforation or abscess w/bleeding    Plan: Probable discharge but await CBC    LOS: 2 days   Felicia Wells L 04/11/2016, 8:00 AM

## 2016-04-11 NOTE — Discharge Summary (Signed)
Physician Discharge Summary  Patient ID: Felicia Wells MRN: [id number] DOB/AGE: 1919/08/27 82 y.o. Primary Care Physician:Yamna Mackel L, MD Admit date: 04/09/2016 Discharge date: 04/11/2016    Discharge Diagnoses:   Principal Problem:   Lower GI bleed Active Problems:   Hypertension   Hematochezia   Bleeding gastrointestinal   Diverticulosis large intestine w/o perforation or abscess w/bleeding   Dementia with behavioral disturbance     Medication List    STOP taking these medications   aspirin EC 81 MG tablet     TAKE these medications   albuterol 108 (90 Base) MCG/ACT inhaler Commonly known as:  PROVENTIL HFA;VENTOLIN HFA Inhale 1-2 puffs into the lungs every 6 (six) hours as needed for wheezing or shortness of breath.   BYSTOLIC 20 MG Tabs Generic drug:  Nebivolol HCl Take 20 mg by mouth 2 (two) times daily.   cholecalciferol 1000 units tablet Commonly known as:  VITAMIN D Take 500 Units by mouth 2 (two) times daily.   diazepam 2 MG tablet Commonly known as:  VALIUM Take 2 mg by mouth 2 (two) times daily as needed (meniere's disease).   famotidine 20 MG tablet Commonly known as:  PEPCID Take 1 tablet (20 mg total) by mouth 2 (two) times daily.   guaiFENesin-dextromethorphan 100-10 MG/5ML syrup Commonly known as:  ROBITUSSIN DM Take 5 mLs by mouth every 4 (four) hours as needed for cough.   hydrALAZINE 10 MG tablet Commonly known as:  APRESOLINE Take 10 mg by mouth 2 (two) times daily.   hydrochlorothiazide 25 MG tablet Commonly known as:  HYDRODIURIL Take 25 mg by mouth daily.   LORazepam 0.5 MG tablet Commonly known as:  ATIVAN Take 1 tablet by mouth 3 (three) times daily as needed (anxiety/agitation).   multivitamin-lutein Caps capsule Take 1 capsule by mouth daily.   multivitamins with iron Tabs tablet Take 1 tablet by mouth daily.   Omega-3 1000 MG Caps Take 2 capsules by mouth daily.   ondansetron 4 MG tablet Commonly known as:   ZOFRAN Take 4 mg by mouth 4 (four) times daily as needed for nausea or vomiting.   potassium chloride 10 MEQ tablet Commonly known as:  K-DUR Take 1 tablet (10 mEq total) by mouth daily.   SOOTHE XP OP Apply 2 drops to eye 4 (four) times daily. Instil one drop in each eye   telmisartan 80 MG tablet Commonly known as:  MICARDIS Take 80 mg by mouth daily.   timolol 0.5 % ophthalmic solution Commonly known as:  BETIMOL Place 1 drop into both eyes daily.       Discharged Condition:Improved    Consults: Gastroenterology  Significant Diagnostic Studies: No results found.  Lab Results: Basic Metabolic Panel:  Recent Labs  86/57/84 0730 04/10/16 0307  NA 133* 136  K 3.8 3.6  CL 99* 105  CO2 30 29  GLUCOSE 106* 100*  BUN 19 17  CREATININE 0.71 0.68  CALCIUM 8.2* 7.7*   Liver Function Tests:  Recent Labs  04/09/16 0730  AST 21  ALT 14  ALKPHOS 64  BILITOT 0.5  PROT 6.1*  ALBUMIN 3.4*     CBC:  Recent Labs  04/10/16 0307 04/10/16 0959  WBC 10.1 9.0  HGB 10.8* 10.6*  HCT 31.4* 31.8*  MCV 92.1 94.4  PLT 172 175    Recent Results (from the past 240 hour(s))  MRSA PCR Screening     Status: None   Collection Time: 04/09/16  2:50 PM  Result Value  Ref Range Status   MRSA by PCR NEGATIVE NEGATIVE Final    Comment:        The GeneXpert MRSA Assay (FDA approved for NASAL specimens only), is one component of a comprehensive MRSA colonization surveillance program. It is not intended to diagnose MRSA infection nor to guide or monitor treatment for MRSA infections.      Hospital Course: This is a 82 year old who came to the hospital because of bleeding. She's had previous episodes of diverticular bleeding. She lives in an assisted living facility. She initially seemed stable and was planned to be admitted to the floor then she became weak and seemed to be having more bleeding and she was transferred to stepdown. She received 2 units of packed red  blood cells. Her bleeding stopped. CBC is pending at this point but assuming that her hemoglobin looks stable she could be transferred back to her assisted living facility. She has no complaints.  Discharge Exam: Blood pressure (!) 131/112, pulse (!) 58, temperature 98.4 F (36.9 C), temperature source Oral, resp. rate (!) 21, height 5\' 5"  (1.651 m), weight 61.9 kg (136 lb 7.4 oz), SpO2 99 %. She is awake and alert. She is mildly confused. Her chest is clear. Her heart is regular  Disposition: Back to her assisted living facility. Stop aspirin      Signed: Correy Weidner L   04/11/2016, 8:16 AM

## 2016-04-11 NOTE — NC FL2 (Signed)
[place] MEDICAID FL2 LEVEL OF CARE SCREENING TOOL     IDENTIFICATION  Patient Name: Felicia Wells Birthdate: [birth year] Sex: female Admission Date (Current Location): 04/09/2016  [facility] [place]   Facility and Address:  [facility],  [address], [place] [id number]      Provider Number: [id number]  Attending Physician Name and Address:  Kari BaarsEdward Hawkins, MD  Relative Name and Phone Number:       Current Level of Care: Hospital Recommended Level of Care: Assisted Living Facility Prior Approval Number:    Date Approved/Denied:   PASRR Number:    Discharge Plan: Other (Comment) [facility])    Current Diagnoses: Patient Active Problem List   Diagnosis Date Noted  . Dementia with behavioral disturbance 04/11/2016  . Lower GI bleed 04/09/2016  . Diverticulosis large intestine w/o perforation or abscess w/bleeding   . Palliative care encounter   . Hematochezia 09/12/2015  . DNR (do not resuscitate) 09/12/2015  . Bleeding gastrointestinal   . CAP (community acquired pneumonia) 12/04/2012  . Hyponatremia 12/04/2012  . Muscular deconditioning 12/04/2012  . Acute delirium 12/04/2012  . Fracture of acetabulum (HCC) 07/19/2012  . Hypertension 07/19/2012  . Meniere's disease 07/19/2012  . Muscle weakness (generalized) 06/03/2011  . Difficulty in walking(719.7) 06/03/2011    Orientation RESPIRATION BLADDER Height & Weight     Self  Normal Continent Weight: 136 lb 7.4 oz (61.9 kg) Height:  5\' 5"  (165.1 cm)  BEHAVIORAL SYMPTOMS/MOOD NEUROLOGICAL BOWEL NUTRITION STATUS      Continent Diet (Heart Healthy)  AMBULATORY STATUS COMMUNICATION OF NEEDS Skin   Limited Assist Verbally Normal                       Personal Care Assistance Level of Assistance  Bathing, Dressing Bathing Assistance: Limited assistance   Dressing Assistance: Limited assistance     Functional Limitations Info  Sight, Hearing, Speech  Sight Info: Adequate Hearing Info: Adequate Speech Info: Adequate    SPECIAL CARE FACTORS FREQUENCY                       Contractures Contractures Info: Not present    Additional Factors Info  Code Status, Allergies Code Status Info: DNR Allergies Info: Alendronate, Codeine, Davocet, Demerol, Ezetimibe-simvastatin, Hydrochlorothiazide W-triamterene, Meperidine, Morphine and Related, Niacin, Other, Oxybutynin Chloride, Prednisone, Propoxyphene, Raloxifene, Risedronate, Simvastatin, Diltiazem Hcl, Lansoprazole, Pravastatin, Sulfa Antibiotics, Teriparatide           Current Medications (04/11/2016):  This is the current hospital active medication list Current Facility-Administered Medications  Medication Dose Route Frequency Provider Last Rate Last Dose  . 0.9 %  sodium chloride infusion   Intravenous Continuous Henderson CloudEstela Y Hernandez Acosta, MD 100 mL/hr at 04/11/16 0035    . ondansetron (ZOFRAN) tablet 4 mg  4 mg Oral Q6H PRN Henderson CloudEstela Y Hernandez Acosta, MD       Or  . ondansetron [facility](ZOFRAN) injection 4 mg  4 mg Intravenous Q6H PRN Henderson CloudEstela Y Hernandez Acosta, MD   4 mg at 04/11/16 0038     Discharge Medications: STOP taking these medications   aspirin EC 81 MG tablet    TAKE these medications   albuterol 108 (90 Base) MCG/ACT inhaler Commonly known as:  PROVENTIL HFA;VENTOLIN HFA Inhale 1-2 puffs into the lungs every 6 (six) hours as needed for wheezing or shortness of breath.  BYSTOLIC 20 MG Tabs Generic drug:  Nebivolol HCl Take 20  mg by mouth 2 (two) times daily.  cholecalciferol 1000 units tablet Commonly known as:  VITAMIN D Take 500 Units by mouth 2 (two) times daily.  diazepam 2 MG tablet Commonly known as:  VALIUM Take 2 mg by mouth 2 (two) times daily as needed (meniere's disease).  famotidine 20 MG tablet Commonly known as:  PEPCID Take 1 tablet (20 mg total) by mouth 2 (two) times daily.  guaiFENesin-dextromethorphan 100-10 MG/5ML syrup Commonly known as:   ROBITUSSIN DM Take 5 mLs by mouth every 4 (four) hours as needed for cough.  hydrALAZINE 10 MG tablet Commonly known as:  APRESOLINE Take 10 mg by mouth 2 (two) times daily.  hydrochlorothiazide 25 MG tablet Commonly known as:  HYDRODIURIL Take 25 mg by mouth daily.  LORazepam 0.5 MG tablet Commonly known as:  ATIVAN Take 1 tablet by mouth 3 (three) times daily as needed (anxiety/agitation).  multivitamin-lutein Caps capsule Take 1 capsule by mouth daily.  multivitamins with iron Tabs tablet Take 1 tablet by mouth daily.  Omega-3 1000 MG Caps Take 2 capsules by mouth daily.  ondansetron 4 MG tablet Commonly known as:  ZOFRAN Take 4 mg by mouth 4 (four) times daily as needed for nausea or vomiting.  potassium chloride 10 MEQ tablet Commonly known as:  K-DUR Take 1 tablet (10 mEq total) by mouth daily.  SOOTHE XP OP Apply 2 drops to eye 4 (four) times daily. Instil one drop in each eye  telmisartan 80 MG tablet Commonly known as:  MICARDIS Take 80 mg by mouth daily.  timolol 0.5 % ophthalmic solution Commonly known as:  BETIMOL Place 1 drop into both eyes daily.       Relevant Imaging Results:  Relevant Lab Results:   Additional Information    Dari Carpenito, Juleen China, LCSW

## 2016-04-11 NOTE — Care Management Important Message (Signed)
Important Message  Patient Details  Name: Felicia Wells MRN: [id number] Date of Birth: [birth year]   Medicare Important Message Given:  Yes    Malcolm MetroChildress, Deshonda Cryderman Demske, RN 04/11/2016, 9:34 AM

## 2016-04-11 NOTE — Progress Notes (Signed)
Orders received for discharge. Instructions including medications were discussed with pt and her daughter. Understanding verbalized. Iv's removed, no complications. Pt taken out via WC by RN with all belongings and taken back to assisted living facility by staff from the facility.

## 2016-04-11 NOTE — Clinical Social Work Note (Signed)
Clinical Social Work Assessment  Patient Details  Name: Felicia Wells MRN: [id number] Date of Birth: Aug 12, 1919  Date of referral:  04/11/16               Reason for consult:  Other (Comment Required) ([facility]                Permission sought to share information with:    Permission granted to share information::     Name::        Agency::     Relationship::     Contact Information:     Housing/Transportation Living arrangements for the past 2 months:  Assisted Living Facility Source of Information:  Adult Children, Facility Patient Interpreter Needed:  None Criminal Activity/Legal Involvement Pertinent to Current Situation/Hospitalization:  No - Comment as needed Significant Relationships:  Adult Children Lives with:  Facility Resident Do you feel safe going back to the place where you live?  Yes Need for family participation in patient care:  Yes (Comment)  Care giving concerns:  Facility resident.   Social Worker assessment / plan:  CSW spoke with [facility]ney [facility]. She advised that patient has been in the facility for years, receives limited assistance, has a supportive family and uses a walker.  Toni AmendCourtney advised that that patient can return to the facility. CSW advised that patient was discharging today. Toni AmendCourtney indicated that [facility] patient back to the facility. Paul DykesSandra Page, daughter, was at bedside. She confirmed facility's statements.  CSW sent clinicals to facility.  CSW signing off.   Employment status:  Retired Database administratornsurance information:  Managed Medicare PT Recommendations:  Not assessed at this time Information / Referral to community resources:     Patient/Family's Response to care:  Family is agreeable for patient to return to the facility at discharge.  Patient/Family's Understanding of and Emotional Response to Diagnosis, Current Treatment, and Prognosis: Patient family is aware diagnosis, treatment and prognosis.     Emotional Assessment Appearance:  Appears stated age Attitude/Demeanor/Rapport:   (Cooperative/Pleasant) Affect (typically observed):  Accepting Orientation:  Oriented to Self Alcohol / Substance use:  Not Applicable Psych involvement (Current and /or in the community):  No (Comment)  Discharge Needs  Concerns to be addressed:    Readmission within the last 30 days:  No Current discharge risk:  None Barriers to Discharge:  No Barriers Identified   Annice NeedySettle, Felicia Trombly D, LCSW 04/11/2016, 11:17 AM

## 2016-04-12 ENCOUNTER — Observation Stay (HOSPITAL_COMMUNITY)
Admission: EM | Admit: 2016-04-12 | Discharge: 2016-04-15 | Disposition: A | Discharge status: Home / Self Care | Payer: Medicare Other | Attending: Pulmonary Disease | Admitting: Pulmonary Disease

## 2016-04-12 ENCOUNTER — Encounter (HOSPITAL_COMMUNITY): Payer: Self-pay | Admitting: *Deleted

## 2016-04-12 DIAGNOSIS — H8109 Meniere's disease, unspecified ear: Secondary | ICD-10-CM | POA: Diagnosis not present

## 2016-04-12 DIAGNOSIS — I10 Essential (primary) hypertension: Secondary | ICD-10-CM | POA: Diagnosis not present

## 2016-04-12 DIAGNOSIS — Z79899 Other long term (current) drug therapy: Secondary | ICD-10-CM | POA: Insufficient documentation

## 2016-04-12 DIAGNOSIS — K922 Gastrointestinal hemorrhage, unspecified: Principal | ICD-10-CM | POA: Insufficient documentation

## 2016-04-12 DIAGNOSIS — R0602 Shortness of breath: Secondary | ICD-10-CM

## 2016-04-12 DIAGNOSIS — E871 Hypo-osmolality and hyponatremia: Secondary | ICD-10-CM | POA: Diagnosis present

## 2016-04-12 LAB — CBC WITH DIFFERENTIAL/PLATELET
BASOS PCT: 1 %
Basophils Absolute: 0.1 10*3/uL (ref 0.0–0.1)
EOS ABS: 0.3 10*3/uL (ref 0.0–0.7)
EOS PCT: 3 %
HCT: 30.4 % — ABNORMAL LOW (ref 36.0–46.0)
HEMOGLOBIN: 10.3 g/dL — AB (ref 12.0–15.0)
Lymphocytes Relative: 16 %
Lymphs Abs: 1.5 10*3/uL (ref 0.7–4.0)
MCH: 32.1 pg (ref 26.0–34.0)
MCHC: 33.9 g/dL (ref 30.0–36.0)
MCV: 94.7 fL (ref 78.0–100.0)
Monocytes Absolute: 1.1 10*3/uL — ABNORMAL HIGH (ref 0.1–1.0)
Monocytes Relative: 11 %
NEUTROS ABS: 6.8 10*3/uL (ref 1.7–7.7)
NEUTROS PCT: 69 %
PLATELETS: 192 10*3/uL (ref 150–400)
RBC: 3.21 MIL/uL — AB (ref 3.87–5.11)
RDW: 14.8 % (ref 11.5–15.5)
WBC: 9.7 10*3/uL (ref 4.0–10.5)

## 2016-04-12 LAB — TYPE AND SCREEN
ABO/RH(D): O POS
ANTIBODY SCREEN: NEGATIVE

## 2016-04-12 LAB — COMPREHENSIVE METABOLIC PANEL
ALBUMIN: 3.2 g/dL — AB (ref 3.5–5.0)
ALK PHOS: 65 U/L (ref 38–126)
ALT: 13 U/L — AB (ref 14–54)
ANION GAP: 4 — AB (ref 5–15)
AST: 21 U/L (ref 15–41)
BUN: 13 mg/dL (ref 6–20)
CALCIUM: 8.3 mg/dL — AB (ref 8.9–10.3)
CHLORIDE: 98 mmol/L — AB (ref 101–111)
CO2: 31 mmol/L (ref 22–32)
CREATININE: 0.66 mg/dL (ref 0.44–1.00)
GFR calc Af Amer: >60 mL/min (ref >60)
GFR calc non Af Amer: >60 mL/min (ref >60)
GLUCOSE: 127 mg/dL — AB (ref 65–99)
Potassium: 3.4 mmol/L — ABNORMAL LOW (ref 3.5–5.1)
SODIUM: 133 mmol/L — AB (ref 135–145)
Total Bilirubin: 0.3 mg/dL (ref 0.3–1.2)
Total Protein: 5.7 g/dL — ABNORMAL LOW (ref 6.5–8.1)

## 2016-04-12 LAB — PROTIME-INR
INR: 1.08
Prothrombin Time: 14.1 seconds (ref 11.4–15.2)

## 2016-04-12 MED ORDER — PROPYLENE GLYCOL-GLYCERIN 0.6-0.6 % OP SOLN: 1.0000 [drp] | Freq: Four times a day (QID) | OPHTHALMIC | Status: DC

## 2016-04-12 MED ORDER — HYDRALAZINE HCL 10 MG PO TABS
10.0000 mg | ORAL_TABLET | Freq: Two times a day (BID) | ORAL | Status: DC
  Administered 2016-04-13 – 2016-04-15 (×6): 10 mg via ORAL
  Filled 2016-04-12 (×6): qty 1

## 2016-04-12 MED ORDER — SODIUM CHLORIDE 0.9% FLUSH
3.0000 mL | Freq: Two times a day (BID) | INTRAVENOUS | Status: DC
  Administered 2016-04-12 – 2016-04-15 (×2): 3 mL via INTRAVENOUS

## 2016-04-12 MED ORDER — HYDROCHLOROTHIAZIDE 25 MG PO TABS
25.0000 mg | ORAL_TABLET | Freq: Every day | ORAL | Status: DC
  Administered 2016-04-13 – 2016-04-15 (×3): 25 mg via ORAL
  Filled 2016-04-12 (×3): qty 1

## 2016-04-12 MED ORDER — NEBIVOLOL HCL 20 MG PO TABS: 20.0000 mg | ORAL_TABLET | Freq: Two times a day (BID) | ORAL | Status: DC

## 2016-04-12 MED ORDER — FAMOTIDINE 20 MG PO TABS
20.0000 mg | ORAL_TABLET | Freq: Two times a day (BID) | ORAL | Status: DC
  Administered 2016-04-13 – 2016-04-15 (×6): 20 mg via ORAL
  Filled 2016-04-12 (×6): qty 1

## 2016-04-12 MED ORDER — OMEGA-3-ACID ETHYL ESTERS 1 G PO CAPS
2.0000 g | ORAL_CAPSULE | Freq: Every day | ORAL | Status: DC
  Administered 2016-04-13 – 2016-04-15 (×3): 2 g via ORAL
  Filled 2016-04-12 (×3): qty 2

## 2016-04-12 MED ORDER — TIMOLOL MALEATE 0.5 % OP SOLN
1.0000 [drp] | Freq: Every day | OPHTHALMIC | Status: DC
  Administered 2016-04-13 – 2016-04-15 (×3): 1 [drp] via OPHTHALMIC
  Filled 2016-04-12: qty 5

## 2016-04-12 MED ORDER — POTASSIUM CHLORIDE CRYS ER 10 MEQ PO TBCR
10.0000 meq | EXTENDED_RELEASE_TABLET | Freq: Every day | ORAL | Status: DC
Start: 2016-04-13 — End: 2016-04-15
  Administered 2016-04-13 – 2016-04-15 (×3): 10 meq via ORAL
  Filled 2016-04-12 (×5): qty 1

## 2016-04-12 MED ORDER — ONDANSETRON HCL 4 MG PO TABS: 4.0000 mg | ORAL_TABLET | Freq: Four times a day (QID) | ORAL | Status: DC | PRN

## 2016-04-12 MED ORDER — LORAZEPAM 0.5 MG PO TABS: 0.5000 mg | ORAL_TABLET | Freq: Three times a day (TID) | ORAL | Status: DC | PRN

## 2016-04-12 MED ORDER — NEBIVOLOL HCL 10 MG PO TABS
20.0000 mg | ORAL_TABLET | Freq: Two times a day (BID) | ORAL | Status: DC
  Administered 2016-04-13 – 2016-04-15 (×6): 20 mg via ORAL
  Filled 2016-04-12 (×6): qty 2

## 2016-04-12 MED ORDER — TIMOLOL HEMIHYDRATE 0.5 % OP SOLN
1.0000 [drp] | Freq: Every day | OPHTHALMIC | Status: DC
Start: 2016-04-12 — End: 2016-04-12

## 2016-04-12 MED ORDER — IRBESARTAN 300 MG PO TABS
300.0000 mg | ORAL_TABLET | Freq: Every day | ORAL | Status: DC
  Administered 2016-04-13 – 2016-04-15 (×3): 300 mg via ORAL
  Filled 2016-04-12 (×3): qty 1

## 2016-04-12 MED ORDER — SODIUM CHLORIDE 0.9 % IV SOLN
INTRAVENOUS | Status: DC
  Administered 2016-04-13 – 2016-04-14 (×3): via INTRAVENOUS

## 2016-04-12 MED ORDER — POLYVINYL ALCOHOL 1.4 % OP SOLN
1.0000 [drp] | OPHTHALMIC | Status: DC
  Administered 2016-04-13 – 2016-04-15 (×13): 1 [drp] via OPHTHALMIC
  Filled 2016-04-12: qty 15

## 2016-04-12 MED ORDER — DIAZEPAM 2 MG PO TABS
2.0000 mg | ORAL_TABLET | Freq: Two times a day (BID) | ORAL | Status: DC | PRN
  Administered 2016-04-14: 2 mg via ORAL
  Filled 2016-04-12: qty 1

## 2016-04-12 NOTE — H&P (Signed)
History and Physical    Felicia Wells QMV:[id number] DOB: 01/28/1919 DOA: 04/12/2016  PCP: Fredirick Maudlin, MD Patient coming from: SNF  Chief Complaint: GI bleed  HPI: Felicia Wells is a 82 y.o. female with medical history significant of recent GI bleed, HTN, GERD, osteoporosis. Of note patient was discharged approximately one day ago after 2 day admission for GI bleed. Patient received a blood transfusion during previous admission. No invasive workup undertaken as bleed was self-limited. History provided in large part by patient's family or well-versed in her care. Per report she returned to the nursing home feeling significantly better than her typical baseline. After family left the patient for the evening they're called back by nursing home staff noted she had 2 very large bloody bowel movements which filled the toilet. Patient denies any crampy abdominal pain, shortness of breath, palpitations, weakness, falls, fevers, hematemesis. In the ED patient had 1 additional bloody bowel movement.     ED Course: Directed findings below.  Review of Systems: As per HPI otherwise 10 point review of systems negative.     Past Medical History:  Diagnosis Date  . Anxiety   . Eye problem   . GERD (gastroesophageal reflux disease)   . Hypertension   . Infections of kidney   . Osteoporosis     Past Surgical History:  Procedure Laterality Date  . brain shunt    . CATARACT EXTRACTION    . DILATION AND CURETTAGE OF UTERUS    . EYE SURGERY    . FOOT SURGERY    . HERNIA REPAIR    . THYROID SURGERY    . TONSILLECTOMY      Social History   Social History  . Marital status: Married    Spouse name: N/A  . Number of children: N/A  . Years of education: N/A   Occupational History  . Not on file.   Social History Main Topics  . Smoking status: Never Smoker  . Smokeless tobacco: Never Used  . Alcohol use No  . Drug use: No  . Sexual activity: No   Other Topics Concern  . Not  on file   Social History Narrative  . No narrative on file    Allergies  Allergen Reactions  . Alendronate Other (See Comments)    Reaction:  Cramping   . Butazolidin [Phenylbutazone] Swelling  . Codeine Nausea And Vomiting  . Darvocet [Propoxyphene N-Acetaminophen] Nausea And Vomiting  . Demerol Nausea And Vomiting  . Ezetimibe-Simvastatin Other (See Comments)    Reaction:  Numbness   . Hydrochlorothiazide W-Triamterene Nausea And Vomiting  . Meperidine Nausea And Vomiting  . Morphine And Related Other (See Comments)    Reaction:  Unknown   . Niacin Nausea And Vomiting and Other (See Comments)    Reaction:  Bloating   . Oxybutynin Chloride Other (See Comments)    Reaction:  Confusion   . Prednisone Other (See Comments)    Reaction:  Confusion   . Propoxyphene Nausea And Vomiting  . Raloxifene Other (See Comments)    Reaction:  Cramping   . Risedronate Other (See Comments)    Reaction:  Dizziness   . Simvastatin Other (See Comments)    Reaction:  Bloating   . Diltiazem Hcl Rash  . Lansoprazole Rash  . Pravastatin Rash  . Sulfa Antibiotics Rash  . Teriparatide Palpitations    History reviewed. No pertinent family history.  Prior to Admission medications   Medication Sig Start Date End Date Taking?  Authorizing Provider  albuterol (PROVENTIL HFA;VENTOLIN HFA) 108 (90 BASE) MCG/ACT inhaler Inhale 1-2 puffs into the lungs every 6 (six) hours as needed for wheezing or shortness of breath. 09/13/13  Yes Donnetta Hutching, MD  cholecalciferol (VITAMIN D) 1000 UNITS tablet Take 3,000 Units by mouth 2 (two) times daily.    Yes Historical Provider, MD  diazepam (VALIUM) 2 MG tablet Take 2 mg by mouth 2 (two) times daily as needed (for Meniere's disease).    Yes Historical Provider, MD  famotidine (PEPCID) 20 MG tablet Take 1 tablet (20 mg total) by mouth 2 (two) times daily. 09/14/15  Yes Avon Gully, MD  guaiFENesin-dextromethorphan (ROBITUSSIN DM) 100-10 MG/5ML syrup Take 5 mLs by  mouth every 4 (four) hours as needed for cough.   Yes Historical Provider, MD  hydrALAZINE (APRESOLINE) 10 MG tablet Take 10 mg by mouth 2 (two) times daily.   Yes Historical Provider, MD  hydrochlorothiazide (HYDRODIURIL) 25 MG tablet Take 25 mg by mouth daily.   Yes Historical Provider, MD  LORazepam (ATIVAN) 0.5 MG tablet Take 1 tablet by mouth 3 (three) times daily as needed for anxiety (and/or agitation).    Yes Historical Provider, MD  Multiple Vitamins-Iron (MULTIVITAMINS WITH IRON) TABS tablet Take 1 tablet by mouth daily.   Yes Historical Provider, MD  multivitamin-lutein (OCUVITE-LUTEIN) CAPS capsule Take 1 capsule by mouth daily.   Yes Historical Provider, MD  Nebivolol HCl (BYSTOLIC) 20 MG TABS Take 20 mg by mouth 2 (two) times daily.   Yes Historical Provider, MD  omega-3 acid ethyl esters (LOVAZA) 1 g capsule Take 2 g by mouth daily.   Yes Historical Provider, MD  ondansetron (ZOFRAN) 4 MG tablet Take 4 mg by mouth 4 (four) times daily as needed for nausea or vomiting.  09/07/15  Yes Historical Provider, MD  potassium chloride (K-DUR) 10 MEQ tablet Take 1 tablet (10 mEq total) by mouth daily. 12/04/12  Yes Kari Baars, MD  Propylene Glycol-Glycerin (SOOTHE) 0.6-0.6 % SOLN Place 1 drop into both eyes 4 (four) times daily.   Yes Historical Provider, MD  telmisartan (MICARDIS) 80 MG tablet Take 80 mg by mouth daily.   Yes Historical Provider, MD  timolol (BETIMOL) 0.5 % ophthalmic solution Place 1 drop into both eyes daily.   Yes Historical Provider, MD    Physical Exam: Vitals:   04/12/16 2000 04/12/16 2017 04/12/16 2030 04/12/16 2133  BP: 125/86 125/86 113/84 114/79  Pulse:  (!) 30 (!) 31 70  Resp: 24 13 18 16   Temp:      TempSrc:      SpO2:  94% 95% 90%  Weight:      Height:         General:  Appears calm and comfortable Eyes:  PERRL, EOMI, normal lids, iris ENT:  grossly normal hearing, lips & tongue, mmm Neck:  no LAD, masses or thyromegaly Cardiovascular:  RRR, no  m/r/g. No LE edema.  Respiratory:  CTA bilaterally, no w/r/r. Normal respiratory effort. Abdomen:  soft, ntnd, NABS Skin:  no rash or induration seen on limited exam Musculoskeletal:  grossly normal tone BUE/BLE, good ROM, no bony abnormality Psychiatric:  grossly normal mood and affect, speech fluent and appropriate, AOx3 Neurologic:  CN 2-12 grossly intact, moves all extremities in coordinated fashion, sensation intact  Labs on Admission: I have personally reviewed following labs and imaging studies  CBC:  Recent Labs Lab 04/09/16 2055 04/10/16 0307 04/10/16 0959 04/11/16 0840 04/12/16 1938  WBC 15.9* 10.1 9.0 10.4  9.7  NEUTROABS  --   --   --  7.9* 6.8  HGB 11.5* 10.8* 10.6* 10.7* 10.3*  HCT 33.7* 31.4* 31.8* 33.5* 30.4*  MCV 92.6 92.1 94.4 96.5 94.7  PLT 183 172 175 179 192   Basic Metabolic Panel:  Recent Labs Lab 04/09/16 0730 04/10/16 0307 04/12/16 1938  NA 133* 136 133*  K 3.8 3.6 3.4*  CL 99* 105 98*  CO2 GLUCOSE 106* 100* 127*  BUN CREATININE 0.71 0.68 0.66  CALCIUM 8.2* 7.7* 8.3*   GFR: Estimated Creatinine Clearance: 38.6 mL/min (by C-G formula based on SCr of 0.8 mg/dL). Liver Function Tests:  Recent Labs Lab 04/09/16 0730 04/12/16 1938  AST 21 21  ALT 14 13*  ALKPHOS 64 65  BILITOT 0.5 0.3  PROT 6.1* 5.7*  ALBUMIN 3.4* 3.2*   No results for input(s): LIPASE, AMYLASE in the last 168 hours. No results for input(s): AMMONIA in the last 168 hours. Coagulation Profile:  Recent Labs Lab 04/09/16 0730 04/09/16 1401 04/12/16 1938  INR 1.07 1.22 1.08   Cardiac Enzymes: No results for input(s): CKTOTAL, CKMB, CKMBINDEX, TROPONINI in the last 168 hours. BNP (last 3 results) No results for input(s): PROBNP in the last 8760 hours. HbA1C: No results for input(s): HGBA1C in the last 72 hours. CBG:  Recent Labs Lab 04/09/16 1244  GLUCAP 149*   Lipid Profile: No results for input(s): CHOL, HDL, LDLCALC, TRIG, CHOLHDL,  LDLDIRECT in the last 72 hours. Thyroid Function Tests: No results for input(s): TSH, T4TOTAL, FREET4, T3FREE, THYROIDAB in the last 72 hours. Anemia Panel: No results for input(s): VITAMINB12, FOLATE, FERRITIN, TIBC, IRON, RETICCTPCT in the last 72 hours. Urine analysis:    Component Value Date/Time   COLORURINE YELLOW 07/14/2013 1910   APPEARANCEUR CLEAR 07/14/2013 1910   LABSPEC 1.025 07/14/2013 1910   PHURINE 5.5 07/14/2013 1910   GLUCOSEU NEGATIVE 07/14/2013 1910   HGBUR NEGATIVE 07/14/2013 1910   BILIRUBINUR NEGATIVE 07/14/2013 1910   KETONESUR NEGATIVE 07/14/2013 1910   PROTEINUR NEGATIVE 07/14/2013 1910   UROBILINOGEN 0.2 07/14/2013 1910   NITRITE NEGATIVE 07/14/2013 1910   LEUKOCYTESUR MODERATE (A) 07/14/2013 1910    Creatinine Clearance: Estimated Creatinine Clearance: 38.6 mL/min (by C-G formula based on SCr of 0.8 mg/dL).  Sepsis Labs: (procalcitonin:4,lacticidven:4) ) Recent Results (from the past 240 hour(s))  MRSA PCR Screening     Status: None   Collection Time: 04/09/16  2:50 PM  Result Value Ref Range Status   MRSA by PCR NEGATIVE NEGATIVE Final    Comment:        The GeneXpert MRSA Assay (FDA approved for NASAL specimens only), is one component of a comprehensive MRSA colonization surveillance program. It is not intended to diagnose MRSA infection nor to guide or monitor treatment for MRSA infections.      Radiological Exams on Admission: No results found.    Assessment/Plan Active Problems:   Meniere's disease   Hyponatremia   Lower GI bleed   GI bleed: Recurrent. Intermittent chronic problem for patient. Discharged 1 day ago after a 2 day admission for similar problem. Hemoglobin stable. Hemodynamically stable. - Telemetry observation - Repeat CBC in a.m. - Consider reconsultation of GI, Dr. Karilyn Cota - Gentle IVF  HTN: - continue telmisartan, Nebivolol, HCTZ, Hydralazine  Electrolyte abnormalities: Mild  - IVF/NS - BMET  in am  Anxiety: - continue Ativan  Menieres': - continue Valium and zofran PRN  GERD: - continue pepcid  DVT prophylaxis: SCD  Code Status: DNR  Family Communication: son and daughter in law  Disposition Plan: pending resolution of GI bleed  Consults called: none  Admission status: tele obs    Wanda Rideout J MD [facility]  If 7PM-7AM, please contact night-coverage [email or web address] Password [email or web address]  04/12/2016, 10:01 PM

## 2016-04-12 NOTE — ED Notes (Signed)
Patient states that she needed to have a BM. Patient placed on bedpan, small bloody and  watery specimen. Seen by EDP

## 2016-04-12 NOTE — ED Provider Notes (Signed)
[facility]-EMERGENCY DEPT Provider Note   CSN: [id number] Arrival date & time: 04/12/16  1857  First Provider Contact:  First MD Initiated Contact with Patient 04/12/16 1906        History   Chief Complaint Chief Complaint  Patient presents with  . GI Bleeding    HPI Felicia Wells is a 82 y.o. female.  HPI  The patient is a 82 year old female with a history of lower GI bleeding likely from diverticular bleeding which dates back to 2006 when she had a colonoscopy. She has had GI bleeding earlier this year which stopped spontaneously, she also had a recent admission this week for lower GI bleeding and required 2 units of packed red blood cells. She had a large amount of blood in her stool several days ago for which she was admitted to the hospital. Consultation was obtained from gastroenterology.  At that time the patient stopped bleeding spontaneously and was transfused, angiography and colonoscopy were not performed. She was discharged in stable condition, according to the nursing facility she had recurrent lower GI bleeding today, large amount of bright red blood in her undergarment. The patient has had some abdominal cramping and tenesmus.  Symptoms are recurrent, nothing makes this better or worse, no associated lightheadedness shortness of breath fevers nausea vomiting or dysuria  Past Medical History:  Diagnosis Date  . Anxiety   . Eye problem   . GERD (gastroesophageal reflux disease)   . Hypertension   . Infections of kidney   . Osteoporosis     Patient Active Problem List   Diagnosis Date Noted  . Dementia with behavioral disturbance 04/11/2016  . Lower GI bleed 04/09/2016  . Diverticulosis large intestine w/o perforation or abscess w/bleeding   . Palliative care encounter   . Hematochezia 09/12/2015  . DNR (do not resuscitate) 09/12/2015  . Bleeding gastrointestinal   . CAP (community acquired pneumonia) 12/04/2012  . Hyponatremia 12/04/2012  . Muscular  deconditioning 12/04/2012  . Acute delirium 12/04/2012  . Fracture of acetabulum (HCC) 07/19/2012  . Hypertension 07/19/2012  . Meniere's disease 07/19/2012  . Muscle weakness (generalized) 06/03/2011  . Difficulty in walking(719.7) 06/03/2011    Past Surgical History:  Procedure Laterality Date  . brain shunt    . CATARACT EXTRACTION    . DILATION AND CURETTAGE OF UTERUS    . EYE SURGERY    . FOOT SURGERY    . HERNIA REPAIR    . THYROID SURGERY    . TONSILLECTOMY      OB History    No data available       Home Medications    Prior to Admission medications   Medication Sig Start Date End Date Taking? Authorizing Provider  albuterol (PROVENTIL HFA;VENTOLIN HFA) 108 (90 BASE) MCG/ACT inhaler Inhale 1-2 puffs into the lungs every 6 (six) hours as needed for wheezing or shortness of breath. 09/13/13  Yes Donnetta HutchingBrian Cook, MD  cholecalciferol (VITAMIN D) 1000 UNITS tablet Take 3,000 Units by mouth 2 (two) times daily.    Yes Historical Provider, MD  diazepam (VALIUM) 2 MG tablet Take 2 mg by mouth 2 (two) times daily as needed (for Meniere's disease).    Yes Historical Provider, MD  famotidine (PEPCID) 20 MG tablet Take 1 tablet (20 mg total) by mouth 2 (two) times daily. 09/14/15  Yes Avon Gullyesfaye Fanta, MD  guaiFENesin-dextromethorphan (ROBITUSSIN DM) 100-10 MG/5ML syrup Take 5 mLs by mouth every 4 (four) hours as needed for cough.   Yes Historical Provider,  MD  hydrALAZINE (APRESOLINE) 10 MG tablet Take 10 mg by mouth 2 (two) times daily.   Yes Historical Provider, MD  hydrochlorothiazide (HYDRODIURIL) 25 MG tablet Take 25 mg by mouth daily.   Yes Historical Provider, MD  LORazepam (ATIVAN) 0.5 MG tablet Take 1 tablet by mouth 3 (three) times daily as needed for anxiety (and/or agitation).    Yes Historical Provider, MD  Multiple Vitamins-Iron (MULTIVITAMINS WITH IRON) TABS tablet Take 1 tablet by mouth daily.   Yes Historical Provider, MD  multivitamin-lutein (OCUVITE-LUTEIN) CAPS capsule  Take 1 capsule by mouth daily.   Yes Historical Provider, MD  Nebivolol HCl (BYSTOLIC) 20 MG TABS Take 20 mg by mouth 2 (two) times daily.   Yes Historical Provider, MD  omega-3 acid ethyl esters (LOVAZA) 1 g capsule Take 2 g by mouth daily.   Yes Historical Provider, MD  ondansetron (ZOFRAN) 4 MG tablet Take 4 mg by mouth 4 (four) times daily as needed for nausea or vomiting.  09/07/15  Yes Historical Provider, MD  potassium chloride (K-DUR) 10 MEQ tablet Take 1 tablet (10 mEq total) by mouth daily. 12/04/12  Yes Kari Baars, MD  Propylene Glycol-Glycerin (SOOTHE) 0.6-0.6 % SOLN Place 1 drop into both eyes 4 (four) times daily.   Yes Historical Provider, MD  telmisartan (MICARDIS) 80 MG tablet Take 80 mg by mouth daily.   Yes Historical Provider, MD  timolol (BETIMOL) 0.5 % ophthalmic solution Place 1 drop into both eyes daily.   Yes Historical Provider, MD    Family History History reviewed. No pertinent family history.  Social History Social History  Substance Use Topics  . Smoking status: Never Smoker  . Smokeless tobacco: Never Used  . Alcohol use No     Allergies   Alendronate; Butazolidin [phenylbutazone]; Codeine; Darvocet [propoxyphene n-acetaminophen]; Demerol; Ezetimibe-simvastatin; Hydrochlorothiazide w-triamterene; Meperidine; Morphine and related; Niacin; Oxybutynin chloride; Prednisone; Propoxyphene; Raloxifene; Risedronate; Simvastatin; Diltiazem hcl; Lansoprazole; Pravastatin; Sulfa antibiotics; and Teriparatide   Review of Systems Review of Systems  All other systems reviewed and are negative.    Physical Exam Updated Vital Signs BP 114/79   Pulse 70   Temp 97.7 F (36.5 C) (Oral)   Resp 16   Ht 5' 5.5" (1.664 m)   Wt 140 lb (63.5 kg)   SpO2 90%   BMI 22.94 kg/m   Physical Exam  Constitutional: She appears well-developed and well-nourished. No distress.  HENT:  Head: Normocephalic and atraumatic.  Mouth/Throat: Oropharynx is clear and moist. No  oropharyngeal exudate.  Eyes: Conjunctivae and EOM are normal. Pupils are equal, round, and reactive to light. Right eye exhibits no discharge. Left eye exhibits no discharge. No scleral icterus.  Neck: Normal range of motion. Neck supple. No JVD present. No thyromegaly present.  Cardiovascular: Normal rate, regular rhythm, normal heart sounds and intact distal pulses.  Exam reveals no gallop and no friction rub.   No murmur heard. Pulmonary/Chest: Effort normal and breath sounds normal. No respiratory distress. She has no wheezes. She has no rales.  Abdominal: Soft. She exhibits no distension and no mass. There is tenderness.  Increased bowel sounds  Musculoskeletal: Normal range of motion. She exhibits edema ( Mild symmetrical lower extremity edema). She exhibits no tenderness.  Lymphadenopathy:    She has no cervical adenopathy.  Neurological: She is alert. Coordination normal.  Skin: Skin is warm and dry. No rash noted. No erythema.  Psychiatric: She has a normal mood and affect. Her behavior is normal.  Nursing note and  vitals reviewed.    ED Treatments / Results  Labs (all labs ordered are listed, but only abnormal results are displayed) Labs Reviewed  CBC WITH DIFFERENTIAL/PLATELET - Abnormal; Notable for the following:       Result Value   RBC 3.21 (*)    Hemoglobin 10.3 (*)    HCT 30.4 (*)    Monocytes Absolute 1.1 (*)    All other components within normal limits  COMPREHENSIVE METABOLIC PANEL - Abnormal; Notable for the following:    Sodium 133 (*)    Potassium 3.4 (*)    Chloride 98 (*)    Glucose, Bld 127 (*)    Calcium 8.3 (*)    Total Protein 5.7 (*)    Albumin 3.2 (*)    ALT 13 (*)    Anion gap 4 (*)    All other components within normal limits  PROTIME-INR  TYPE AND SCREEN    EKG  EKG Interpretation None       Radiology No results found.  Procedures Procedures (including critical care time)  Medications Ordered in ED Medications - No data to  display   Initial Impression / Assessment and Plan / ED Course  I have reviewed the triage vital signs and the nursing notes.  Pertinent labs & imaging results that were available during my care of the patient were reviewed by me and considered in my medical decision making (see chart for details).  Clinical Course  Comment By Time  Recurrent lower GI bleeding, need stool sample, hematologic studies, anticipate readmission as the patient is continuing to have lower GI bleeding Eber Hong, MD 08/12 1934  Hgb close to baseline - having ongoing bleeding, page hosptialist for admission Eber Hong, MD 08/12 2115  Admitted to Dr. Konrad Dolores with hospitalist service VS stable - pulse in the 70's persistsently Eber Hong, MD 08/12 2155      Final Clinical Impressions(s) / ED Diagnoses   Final diagnoses:  Lower GI bleed    New Prescriptions New Prescriptions   No medications on file     Eber Hong, MD 04/12/16 2156

## 2016-04-12 NOTE — ED Triage Notes (Signed)
Pt seen here yesterday for same GI bleed, reported that pt had dark BM in her depends today.  [facility]

## 2016-04-13 LAB — BASIC METABOLIC PANEL
Anion gap: 5 (ref 5–15)
BUN: 13 mg/dL (ref 6–20)
CO2: 31 mmol/L (ref 22–32)
Calcium: 8.3 mg/dL — ABNORMAL LOW (ref 8.9–10.3)
Chloride: 99 mmol/L — ABNORMAL LOW (ref 101–111)
Creatinine, Ser: 0.66 mg/dL (ref 0.44–1.00)
Glucose, Bld: 95 mg/dL (ref 65–99)
POTASSIUM: 3.6 mmol/L (ref 3.5–5.1)
SODIUM: 135 mmol/L (ref 135–145)

## 2016-04-13 LAB — CBC
HCT: 29.3 % — ABNORMAL LOW (ref 36.0–46.0)
HEMOGLOBIN: 9.7 g/dL — AB (ref 12.0–15.0)
MCH: 31.8 pg (ref 26.0–34.0)
MCHC: 33.1 g/dL (ref 30.0–36.0)
MCV: 96.1 fL (ref 78.0–100.0)
PLATELETS: 199 10*3/uL (ref 150–400)
RBC: 3.05 MIL/uL — AB (ref 3.87–5.11)
RDW: 15 % (ref 11.5–15.5)
WBC: 7.6 10*3/uL (ref 4.0–10.5)

## 2016-04-13 LAB — TYPE AND SCREEN
ABO/RH(D): O POS
ANTIBODY SCREEN: NEGATIVE
UNIT DIVISION: 0
UNIT DIVISION: 0
UNIT DIVISION: 0

## 2016-04-13 MED ORDER — ONDANSETRON HCL 4 MG/2ML IJ SOLN: 4.0000 mg | Freq: Three times a day (TID) | INTRAMUSCULAR | Status: AC | PRN

## 2016-04-13 NOTE — Progress Notes (Signed)
Subjective: She came back with GI bleeding again. She's not having any more bleeding now. She is hemodynamically stable and her hemoglobin level is pretty stable. Discussed with her daughter-in-law at bedside and family is unanimous that they do not want to do invasive procedures. She says she feels well. She ate breakfast. She has no complaints Objective: Vital signs in last 24 hours: Temp:  [97.7 F (36.5 C)-98.2 F (36.8 C)] 97.9 F (36.6 C) (08/13 0545) Pulse Rate:  [30-100] 100 (08/13 0545) Resp:  [13-33] 18 (08/13 0545) BP: (113-146)/(57-86) 124/68 (08/13 0545) SpO2:  [90 %-95 %] 90 % (08/13 0545) Weight:  [60.3 kg (132 lb 15 oz)-63.5 kg (140 lb)] 60.3 kg (132 lb 15 oz) (08/12 2319) Weight change:  Last BM Date: 04/13/16  Intake/Output from previous day: 08/12 0701 - 08/13 0700 In: 293.3 [I.V.:293.3] Out: -   PHYSICAL EXAM General appearance: alert, cooperative and no distress Resp: clear to auscultation bilaterally Cardio: regular rate and rhythm, S1, S2 normal, no murmur, click, rub or gallop GI: soft, non-tender; bowel sounds normal; no masses,  no organomegaly Extremities: extremities normal, atraumatic, no cyanosis or edema  Lab Results:  Results for orders placed or performed during the hospital encounter of 04/12/16 (from the past 48 hour(s))  CBC with Differential     Status: Abnormal   Collection Time: 04/12/16  7:38 PM  Result Value Ref Range   WBC 9.7 4.0 - 10.5 K/uL   RBC 3.21 (L) 3.87 - 5.11 MIL/uL   Hemoglobin 10.3 (L) 12.0 - 15.0 g/dL   HCT 30.4 (L) 36.0 - 46.0 %   MCV 94.7 78.0 - 100.0 fL   MCH 32.1 26.0 - 34.0 pg   MCHC 33.9 30.0 - 36.0 g/dL   RDW 14.8 11.5 - 15.5 %   Platelets 192 150 - 400 K/uL   Neutrophils Relative % 69 %   Neutro Abs 6.8 1.7 - 7.7 K/uL   Lymphocytes Relative 16 %   Lymphs Abs 1.5 0.7 - 4.0 K/uL   Monocytes Relative 11 %   Monocytes Absolute 1.1 (H) 0.1 - 1.0 K/uL   Eosinophils Relative 3 %   Eosinophils Absolute 0.3 0.0 -  0.7 K/uL   Basophils Relative 1 %   Basophils Absolute 0.1 0.0 - 0.1 K/uL  Comprehensive metabolic panel     Status: Abnormal   Collection Time: 04/12/16  7:38 PM  Result Value Ref Range   Sodium 133 (L) 135 - 145 mmol/L   Potassium 3.4 (L) 3.5 - 5.1 mmol/L   Chloride 98 (L) 101 - 111 mmol/L   CO2 31 22 - 32 mmol/L   Glucose, Bld 127 (H) 65 - 99 mg/dL   BUN 13 6 - 20 mg/dL   Creatinine, Ser 0.66 0.44 - 1.00 mg/dL   Calcium 8.3 (L) 8.9 - 10.3 mg/dL   Total Protein 5.7 (L) 6.5 - 8.1 g/dL   Albumin 3.2 (L) 3.5 - 5.0 g/dL   AST 21 15 - 41 U/L   ALT 13 (L) 14 - 54 U/L   Alkaline Phosphatase 65 38 - 126 U/L   Total Bilirubin 0.3 0.3 - 1.2 mg/dL   GFR calc non Af Amer >60 >60 mL/min   GFR calc Af Amer >60 >60 mL/min    Comment: (NOTE) The eGFR has been calculated using the CKD EPI equation. This calculation has not been validated in all clinical situations. eGFR's persistently <60 mL/min signify possible Chronic Kidney Disease.    Anion gap 4 (  L) 5 - 15  Type and screen     Status: None   Collection Time: 04/12/16  7:38 PM  Result Value Ref Range   ABO/RH(D) O POS    Antibody Screen NEG    Sample Expiration 04/15/2016   Protime-INR     Status: None   Collection Time: 04/12/16  7:38 PM  Result Value Ref Range   Prothrombin Time 14.1 11.4 - 15.2 seconds   INR 1.08   CBC     Status: Abnormal   Collection Time: 04/13/16  6:36 AM  Result Value Ref Range   WBC 7.6 4.0 - 10.5 K/uL   RBC 3.05 (L) 3.87 - 5.11 MIL/uL   Hemoglobin 9.7 (L) 12.0 - 15.0 g/dL   HCT 29.3 (L) 36.0 - 46.0 %   MCV 96.1 78.0 - 100.0 fL   MCH 31.8 26.0 - 34.0 pg   MCHC 33.1 30.0 - 36.0 g/dL   RDW 15.0 11.5 - 15.5 %   Platelets 199 150 - 400 K/uL  Basic metabolic panel     Status: Abnormal   Collection Time: 04/13/16  6:36 AM  Result Value Ref Range   Sodium 135 135 - 145 mmol/L   Potassium 3.6 3.5 - 5.1 mmol/L   Chloride 99 (L) 101 - 111 mmol/L   CO2 31 22 - 32 mmol/L   Glucose, Bld 95 65 - 99 mg/dL    BUN 13 6 - 20 mg/dL   Creatinine, Ser 0.66 0.44 - 1.00 mg/dL   Calcium 8.3 (L) 8.9 - 10.3 mg/dL   GFR calc non Af Amer >60 >60 mL/min   GFR calc Af Amer >60 >60 mL/min    Comment: (NOTE) The eGFR has been calculated using the CKD EPI equation. This calculation has not been validated in all clinical situations. eGFR's persistently <60 mL/min signify possible Chronic Kidney Disease.    Anion gap 5 5 - 15    ABGS No results for input(s): PHART, PO2ART, TCO2, HCO3 in the last 72 hours.  Invalid input(s): PCO2 CULTURES Recent Results (from the past 240 hour(s))  MRSA PCR Screening     Status: None   Collection Time: 04/09/16  2:50 PM  Result Value Ref Range Status   MRSA by PCR NEGATIVE NEGATIVE Final    Comment:        The GeneXpert MRSA Assay (FDA approved for NASAL specimens only), is one component of a comprehensive MRSA colonization surveillance program. It is not intended to diagnose MRSA infection nor to guide or monitor treatment for MRSA infections.    Studies/Results: No results found.  Medications:  Prior to Admission:  Prescriptions Prior to Admission  Medication Sig Dispense Refill Last Dose  . albuterol (PROVENTIL HFA;VENTOLIN HFA) 108 (90 BASE) MCG/ACT inhaler Inhale 1-2 puffs into the lungs every 6 (six) hours as needed for wheezing or shortness of breath. 1 Inhaler 0 Past Month at Unknown time  . cholecalciferol (VITAMIN D) 1000 UNITS tablet Take 3,000 Units by mouth 2 (two) times daily.    04/12/2016 at Unknown time  . diazepam (VALIUM) 2 MG tablet Take 2 mg by mouth 2 (two) times daily as needed (for Meniere's disease).    Past Month at Unknown time  . famotidine (PEPCID) 20 MG tablet Take 1 tablet (20 mg total) by mouth 2 (two) times daily. 60 tablet 3 04/12/2016 at Unknown time  . guaiFENesin-dextromethorphan (ROBITUSSIN DM) 100-10 MG/5ML syrup Take 5 mLs by mouth every 4 (four) hours as needed for  cough.   Past Month at Unknown time  . hydrALAZINE  (APRESOLINE) 10 MG tablet Take 10 mg by mouth 2 (two) times daily.   04/12/2016 at Unknown time  . hydrochlorothiazide (HYDRODIURIL) 25 MG tablet Take 25 mg by mouth daily.   04/12/2016 at Unknown time  . LORazepam (ATIVAN) 0.5 MG tablet Take 1 tablet by mouth 3 (three) times daily as needed for anxiety (and/or agitation).    Past Month at Unknown time  . Multiple Vitamins-Iron (MULTIVITAMINS WITH IRON) TABS tablet Take 1 tablet by mouth daily.   04/12/2016 at Unknown time  . multivitamin-lutein (OCUVITE-LUTEIN) CAPS capsule Take 1 capsule by mouth daily.   04/12/2016 at Unknown time  . Nebivolol HCl (BYSTOLIC) 20 MG TABS Take 20 mg by mouth 2 (two) times daily.   04/12/2016 at Unknown time  . omega-3 acid ethyl esters (LOVAZA) 1 g capsule Take 2 g by mouth daily.   04/12/2016 at Unknown time  . ondansetron (ZOFRAN) 4 MG tablet Take 4 mg by mouth 4 (four) times daily as needed for nausea or vomiting.    Past Month at Unknown time  . potassium chloride (K-DUR) 10 MEQ tablet Take 1 tablet (10 mEq total) by mouth daily. 30 tablet 12 04/12/2016 at Unknown time  . Propylene Glycol-Glycerin (SOOTHE) 0.6-0.6 % SOLN Place 1 drop into both eyes 4 (four) times daily.   04/12/2016 at Unknown time  . telmisartan (MICARDIS) 80 MG tablet Take 80 mg by mouth daily.   04/12/2016 at Unknown time  . timolol (BETIMOL) 0.5 % ophthalmic solution Place 1 drop into both eyes daily.   04/12/2016 at Unknown time   Scheduled: . famotidine  20 mg Oral BID  . hydrALAZINE  10 mg Oral BID  . hydrochlorothiazide  25 mg Oral Daily  . irbesartan  300 mg Oral Daily  . nebivolol  20 mg Oral BID  . omega-3 acid ethyl esters  2 g Oral Daily  . polyvinyl alcohol  1 drop Both Eyes Q4H  . potassium chloride  10 mEq Oral Daily  . sodium chloride flush  3 mL Intravenous Q12H  . timolol  1 drop Both Eyes Daily   Continuous: . sodium chloride 50 mL/hr at 04/13/16 0006   ZOX:[id number], LORazepam, ondansetron (ZOFRAN) IV,  ondansetron  Assesment:She was admitted with lower GI bleeding. This is a recurrent episode. She is hemodynamically stable. She is stable with her hemoglobin level. This is felt to likely be diverticular bleeding. It is not associated with any abdominal pain. She does not need blood at this point. Active Problems:   Meniere's disease   Hyponatremia   Lower GI bleed    Plan: Observe for another 24 hours recheck CBC and if all that's okay I will discharge her back to assisted living facility    LOS: 0 days   , L 04/13/2016, 9:13 AM

## 2016-04-13 NOTE — Care Management Obs Status (Signed)
MEDICARE OBSERVATION STATUS NOTIFICATION   Patient Details  Name: Felicia GasserVirginia P Cancelliere MRN: [id number] Date of Birth: 1918-11-18   Medicare Observation Status Notification Given:  Yes    Fuller PlanWelborn, Nadiah Corbit M, RN 04/13/2016, 3:15 PM

## 2016-04-14 LAB — CBC WITH DIFFERENTIAL/PLATELET
BASOS PCT: 1 %
Basophils Absolute: 0.1 10*3/uL (ref 0.0–0.1)
EOS PCT: 4 %
Eosinophils Absolute: 0.3 10*3/uL (ref 0.0–0.7)
HCT: 26.4 % — ABNORMAL LOW (ref 36.0–46.0)
HEMOGLOBIN: 8.6 g/dL — AB (ref 12.0–15.0)
Lymphocytes Relative: 20 %
Lymphs Abs: 1.5 10*3/uL (ref 0.7–4.0)
MCH: 31.3 pg (ref 26.0–34.0)
MCHC: 32.6 g/dL (ref 30.0–36.0)
MCV: 96 fL (ref 78.0–100.0)
Monocytes Absolute: 0.8 10*3/uL (ref 0.1–1.0)
Monocytes Relative: 10 %
NEUTROS PCT: 65 %
Neutro Abs: 5.1 10*3/uL (ref 1.7–7.7)
PLATELETS: 212 10*3/uL (ref 150–400)
RBC: 2.75 MIL/uL — AB (ref 3.87–5.11)
RDW: 15.3 % (ref 11.5–15.5)
WBC: 7.7 10*3/uL (ref 4.0–10.5)

## 2016-04-14 LAB — HEMOGLOBIN AND HEMATOCRIT, BLOOD
HEMATOCRIT: 28.3 % — AB (ref 36.0–46.0)
HEMOGLOBIN: 9.2 g/dL — AB (ref 12.0–15.0)

## 2016-04-14 NOTE — Progress Notes (Signed)
Subjective: She feels okay. No new complaints. Her breathing is okay. She was able to eat yesterday. No bleeding has been noted  Objective: Vital signs in last 24 hours: Temp:  [98.1 F (36.7 C)-98.4 F (36.9 C)] 98.3 F (36.8 C) (08/14 0656) Pulse Rate:  [46-66] 46 (08/14 0656) Resp:  [17-18] 18 (08/14 0656) BP: (106-109)/(43-93) 109/93 (08/14 0656) SpO2:  [90 %-95 %] 95 % (08/14 0656) Weight change:  Last BM Date: 04/13/16  Intake/Output from previous day: 08/13 0701 - 08/14 0700 In: 691.7 [P.O.:240; I.V.:451.7] Out: 1400 [Urine:1400]  PHYSICAL EXAM General appearance: alert, cooperative and no distress Resp: clear to auscultation bilaterally Cardio: regular rate and rhythm, S1, S2 normal, no murmur, click, rub or gallop GI: soft, non-tender; bowel sounds normal; no masses,  no organomegaly Extremities: extremities normal, atraumatic, no cyanosis or edema  Lab Results:  Results for orders placed or performed during the hospital encounter of 04/12/16 (from the past 48 hour(s))  CBC with Differential     Status: Abnormal   Collection Time: 04/12/16  7:38 PM  Result Value Ref Range   WBC 9.7 4.0 - 10.5 K/uL   RBC 3.21 (Wells) 3.87 - 5.11 MIL/uL   Hemoglobin 10.3 (Wells) 12.0 - 15.0 g/dL   HCT 30.4 (Wells) 36.0 - 46.0 %   MCV 94.7 78.0 - 100.0 fL   MCH 32.1 26.0 - 34.0 pg   MCHC 33.9 30.0 - 36.0 g/dL   RDW 14.8 11.5 - 15.5 %   Platelets 192 150 - 400 K/uL   Neutrophils Relative % 69 %   Neutro Abs 6.8 1.7 - 7.7 K/uL   Lymphocytes Relative 16 %   Lymphs Abs 1.5 0.7 - 4.0 K/uL   Monocytes Relative 11 %   Monocytes Absolute 1.1 (H) 0.1 - 1.0 K/uL   Eosinophils Relative 3 %   Eosinophils Absolute 0.3 0.0 - 0.7 K/uL   Basophils Relative 1 %   Basophils Absolute 0.1 0.0 - 0.1 K/uL  Comprehensive metabolic panel     Status: Abnormal   Collection Time: 04/12/16  7:38 PM  Result Value Ref Range   Sodium 133 (Wells) 135 - 145 mmol/Wells   Potassium 3.4 (Wells) 3.5 - 5.1 mmol/Wells   Chloride 98 (Wells)  101 - 111 mmol/Wells   CO2 31 22 - 32 mmol/Wells   Glucose, Bld 127 (H) 65 - 99 mg/dL   BUN 13 6 - 20 mg/dL   Creatinine, Ser 0.66 0.44 - 1.00 mg/dL   Calcium 8.3 (Wells) 8.9 - 10.3 mg/dL   Total Protein 5.7 (Wells) 6.5 - 8.1 g/dL   Albumin 3.2 (Wells) 3.5 - 5.0 g/dL   AST 21 15 - 41 U/Wells   ALT 13 (Wells) 14 - 54 U/Wells   Alkaline Phosphatase 65 38 - 126 U/Wells   Total Bilirubin 0.3 0.3 - 1.2 mg/dL   GFR calc non Af Amer >60 >60 mL/min   GFR calc Af Amer >60 >60 mL/min    Comment: (NOTE) The eGFR has been calculated using the CKD EPI equation. This calculation has not been validated in all clinical situations. eGFR's persistently <60 mL/min signify possible Chronic Kidney Disease.    Anion gap 4 (Wells) 5 - 15  Type and screen     Status: None   Collection Time: 04/12/16  7:38 PM  Result Value Ref Range   ABO/RH(D) O POS    Antibody Screen NEG    Sample Expiration 04/15/2016   Protime-INR     Status:  None   Collection Time: 04/12/16  7:38 PM  Result Value Ref Range   Prothrombin Time 14.1 11.4 - 15.2 seconds   INR 1.08   CBC     Status: Abnormal   Collection Time: 04/13/16  6:36 AM  Result Value Ref Range   WBC 7.6 4.0 - 10.5 K/uL   RBC 3.05 (Wells) 3.87 - 5.11 MIL/uL   Hemoglobin 9.7 (Wells) 12.0 - 15.0 g/dL   HCT 29.3 (Wells) 36.0 - 46.0 %   MCV 96.1 78.0 - 100.0 fL   MCH 31.8 26.0 - 34.0 pg   MCHC 33.1 30.0 - 36.0 g/dL   RDW 15.0 11.5 - 15.5 %   Platelets 199 150 - 400 K/uL  Basic metabolic panel     Status: Abnormal   Collection Time: 04/13/16  6:36 AM  Result Value Ref Range   Sodium 135 135 - 145 mmol/Wells   Potassium 3.6 3.5 - 5.1 mmol/Wells   Chloride 99 (Wells) 101 - 111 mmol/Wells   CO2 31 22 - 32 mmol/Wells   Glucose, Bld 95 65 - 99 mg/dL   BUN 13 6 - 20 mg/dL   Creatinine, Ser 0.66 0.44 - 1.00 mg/dL   Calcium 8.3 (Wells) 8.9 - 10.3 mg/dL   GFR calc non Af Amer >60 >60 mL/min   GFR calc Af Amer >60 >60 mL/min    Comment: (NOTE) The eGFR has been calculated using the CKD EPI equation. This calculation has not been  validated in all clinical situations. eGFR's persistently <60 mL/min signify possible Chronic Kidney Disease.    Anion gap 5 5 - 15  CBC with Differential/Platelet     Status: Abnormal   Collection Time: 04/14/16  7:20 AM  Result Value Ref Range   WBC 7.7 4.0 - 10.5 K/uL   RBC 2.75 (Wells) 3.87 - 5.11 MIL/uL   Hemoglobin 8.6 (Wells) 12.0 - 15.0 g/dL   HCT 26.4 (Wells) 36.0 - 46.0 %   MCV 96.0 78.0 - 100.0 fL   MCH 31.3 26.0 - 34.0 pg   MCHC 32.6 30.0 - 36.0 g/dL   RDW 15.3 11.5 - 15.5 %   Platelets 212 150 - 400 K/uL   Neutrophils Relative % 65 %   Neutro Abs 5.1 1.7 - 7.7 K/uL   Lymphocytes Relative 20 %   Lymphs Abs 1.5 0.7 - 4.0 K/uL   Monocytes Relative 10 %   Monocytes Absolute 0.8 0.1 - 1.0 K/uL   Eosinophils Relative 4 %   Eosinophils Absolute 0.3 0.0 - 0.7 K/uL   Basophils Relative 1 %   Basophils Absolute 0.1 0.0 - 0.1 K/uL    ABGS No results for input(s): PHART, PO2ART, TCO2, HCO3 in the last 72 hours.  Invalid input(s): PCO2 CULTURES Recent Results (from the past 240 hour(s))  MRSA PCR Screening     Status: None   Collection Time: 04/09/16  2:50 PM  Result Value Ref Range Status   MRSA by PCR NEGATIVE NEGATIVE Final    Comment:        The GeneXpert MRSA Assay (FDA approved for NASAL specimens only), is one component of a comprehensive MRSA colonization surveillance program. It is not intended to diagnose MRSA infection nor to guide or monitor treatment for MRSA infections.    Studies/Results: No results found.  Medications:  Prior to Admission:  Prescriptions Prior to Admission  Medication Sig Dispense Refill Last Dose  . albuterol (PROVENTIL HFA;VENTOLIN HFA) 108 (90 BASE) MCG/ACT inhaler Inhale 1-2 puffs  into the lungs every 6 (six) hours as needed for wheezing or shortness of breath. 1 Inhaler 0 Past Month at Unknown time  . cholecalciferol (VITAMIN D) 1000 UNITS tablet Take 3,000 Units by mouth 2 (two) times daily.    04/12/2016 at Unknown time  .  diazepam (VALIUM) 2 MG tablet Take 2 mg by mouth 2 (two) times daily as needed (for Meniere's disease).    Past Month at Unknown time  . famotidine (PEPCID) 20 MG tablet Take 1 tablet (20 mg total) by mouth 2 (two) times daily. 60 tablet 3 04/12/2016 at Unknown time  . guaiFENesin-dextromethorphan (ROBITUSSIN DM) 100-10 MG/5ML syrup Take 5 mLs by mouth every 4 (four) hours as needed for cough.   Past Month at Unknown time  . hydrALAZINE (APRESOLINE) 10 MG tablet Take 10 mg by mouth 2 (two) times daily.   04/12/2016 at Unknown time  . hydrochlorothiazide (HYDRODIURIL) 25 MG tablet Take 25 mg by mouth daily.   04/12/2016 at Unknown time  . LORazepam (ATIVAN) 0.5 MG tablet Take 1 tablet by mouth 3 (three) times daily as needed for anxiety (and/or agitation).    Past Month at Unknown time  . Multiple Vitamins-Iron (MULTIVITAMINS WITH IRON) TABS tablet Take 1 tablet by mouth daily.   04/12/2016 at Unknown time  . multivitamin-lutein (OCUVITE-LUTEIN) CAPS capsule Take 1 capsule by mouth daily.   04/12/2016 at Unknown time  . Nebivolol HCl (BYSTOLIC) 20 MG TABS Take 20 mg by mouth 2 (two) times daily.   04/12/2016 at Unknown time  . omega-3 acid ethyl esters (LOVAZA) 1 g capsule Take 2 g by mouth daily.   04/12/2016 at Unknown time  . ondansetron (ZOFRAN) 4 MG tablet Take 4 mg by mouth 4 (four) times daily as needed for nausea or vomiting.    Past Month at Unknown time  . potassium chloride (K-DUR) 10 MEQ tablet Take 1 tablet (10 mEq total) by mouth daily. 30 tablet 12 04/12/2016 at Unknown time  . Propylene Glycol-Glycerin (SOOTHE) 0.6-0.6 % SOLN Place 1 drop into both eyes 4 (four) times daily.   04/12/2016 at Unknown time  . telmisartan (MICARDIS) 80 MG tablet Take 80 mg by mouth daily.   04/12/2016 at Unknown time  . timolol (BETIMOL) 0.5 % ophthalmic solution Place 1 drop into both eyes daily.   04/12/2016 at Unknown time   Scheduled: . famotidine  20 mg Oral BID  . hydrALAZINE  10 mg Oral BID  .  hydrochlorothiazide  25 mg Oral Daily  . irbesartan  300 mg Oral Daily  . nebivolol  20 mg Oral BID  . omega-3 acid ethyl esters  2 g Oral Daily  . polyvinyl alcohol  1 drop Both Eyes Q4H  . potassium chloride  10 mEq Oral Daily  . sodium chloride flush  3 mL Intravenous Q12H  . timolol  1 drop Both Eyes Daily   Continuous: . sodium chloride 50 mL/hr at 04/13/16 1721   EML:[id number], LORazepam, ondansetron  Assesment:She was admitted with lower GI bleeding and has acute on chronic blood loss anemia. Her hemoglobin has dropped by about a grams since yesterday. She's not having any active bleeding but I think she needs another 24 hours of observation and recheck of her hemoglobin level before she is transferred back to assisted living facility Active Problems:   Meniere's disease   Hyponatremia   Lower GI bleed    Plan: As above    LOS: 0 days   Felicia Wells 04/14/2016,  8:17 AM

## 2016-04-15 ENCOUNTER — Observation Stay (HOSPITAL_COMMUNITY): Payer: Medicare Other

## 2016-04-15 LAB — CBC WITH DIFFERENTIAL/PLATELET
BASOS ABS: 0.1 10*3/uL (ref 0.0–0.1)
Basophils Relative: 1 %
EOS ABS: 0.2 10*3/uL (ref 0.0–0.7)
EOS PCT: 3 %
HCT: 28.5 % — ABNORMAL LOW (ref 36.0–46.0)
Hemoglobin: 9.2 g/dL — ABNORMAL LOW (ref 12.0–15.0)
Lymphocytes Relative: 17 %
Lymphs Abs: 1.4 10*3/uL (ref 0.7–4.0)
MCH: 31.1 pg (ref 26.0–34.0)
MCHC: 32.3 g/dL (ref 30.0–36.0)
MCV: 96.3 fL (ref 78.0–100.0)
Monocytes Absolute: 0.7 10*3/uL (ref 0.1–1.0)
Monocytes Relative: 9 %
NEUTROS PCT: 70 %
Neutro Abs: 5.7 10*3/uL (ref 1.7–7.7)
PLATELETS: 267 10*3/uL (ref 150–400)
RBC: 2.96 MIL/uL — AB (ref 3.87–5.11)
RDW: 15.2 % (ref 11.5–15.5)
WBC: 8.1 10*3/uL (ref 4.0–10.5)

## 2016-04-15 MED ORDER — FUROSEMIDE 10 MG/ML IJ SOLN
20.0000 mg | Freq: Once | INTRAMUSCULAR | Status: AC
  Administered 2016-04-15: 20 mg via INTRAVENOUS
  Filled 2016-04-15: qty 2

## 2016-04-15 NOTE — NC FL2 (Signed)
[place] MEDICAID FL2 LEVEL OF CARE SCREENING TOOL     IDENTIFICATION  Patient Name: Felicia Wells Birthdate: 06-17-1919 Sex: female Admission Date (Current Location): 04/12/2016  [facility] [place]   Facility and Address:  [facility],  [address], [place] [id number]      Provider Number: [id number]  Attending Physician Name and Address:  Kari BaarsEdward Hawkins, MD  Relative Name and Phone Number:       Current Level of Care: Hospital Recommended Level of Care: Assisted Living Facility Prior Approval Number:    Date Approved/Denied:   PASRR Number:    Discharge Plan: [facility])    Current Diagnoses: Patient Active Problem List   Diagnosis Date Noted  . Dementia with behavioral disturbance 04/11/2016  . Lower GI bleed 04/09/2016  . Diverticulosis large intestine w/o perforation or abscess w/bleeding   . Palliative care encounter   . Hematochezia 09/12/2015  . DNR (do not resuscitate) 09/12/2015  . Bleeding gastrointestinal   . CAP (community acquired pneumonia) 12/04/2012  . Hyponatremia 12/04/2012  . Muscular deconditioning 12/04/2012  . Acute delirium 12/04/2012  . Fracture of acetabulum (HCC) 07/19/2012  . Essential hypertension 07/19/2012  . Meniere's disease 07/19/2012  . Muscle weakness (generalized) 06/03/2011  . Difficulty in walking(719.7) 06/03/2011    Orientation RESPIRATION BLADDER Height & Weight     Self  Normal Continent Weight: 132 lb 15 oz (60.3 kg) Height:  5' 5.5" (166.4 cm)  BEHAVIORAL SYMPTOMS/MOOD NEUROLOGICAL BOWEL NUTRITION STATUS      Continent Diet (Diet Heart Healthy)  AMBULATORY STATUS COMMUNICATION OF NEEDS Skin   Limited Assist Verbally Normal                       Personal Care Assistance Level of Assistance  Bathing, Dressing Bathing Assistance: Limited assistance   Dressing Assistance: Limited assistance     Functional Limitations Info  Sight, Hearing,  Speech Sight Info: Adequate Hearing Info: Adequate Speech Info: Adequate    SPECIAL CARE FACTORS FREQUENCY                       Contractures Contractures Info: Not present    Additional Factors Info  Code Status, Allergies Code Status Info: DNR Allergies Info: Aldronate, Codeine, Darvocet, Demerol, Ezetimibe-simvastatin, Hydrochlorothiazide W-triamterene, Meperidine, Morphine and Related, Niacin, Other, Oxybutynin Chloride, Prednisone, Propoxyphene, Raloxifene, Risedronate, Simvastatin, Diltiazem Hcl, Lansoprazole, Pravastatin, Sulfa Antibiotics, Teriparatide           Current Medications (04/15/2016):  This is the current hospital active medication list Current Facility-Administered Medications  Medication Dose Route Frequency Provider Last Rate Last Dose  . diazepam (VALIUM) tablet 2 mg  2 mg Oral BID PRN Ozella Rocksavid J Merrell, MD   2 mg at 04/14/16 0825  . famotidine (PEPCID) tablet 20 mg  20 mg Oral BID Ozella Rocksavid J Merrell, MD   20 mg at 04/15/16 0944  . hydrALAZINE (APRESOLINE) tablet 10 mg  10 mg Oral BID Ozella Rocksavid J Merrell, MD   10 mg at 04/15/16 0944  . hydrochlorothiazide (HYDRODIURIL) tablet 25 mg  25 mg Oral Daily Ozella Rocksavid J Merrell, MD   25 mg at 04/15/16 0944  . irbesartan (AVAPRO) tablet 300 mg  300 mg Oral Daily Ozella Rocksavid J Merrell, MD   300 mg at 04/15/16 0944  . LORazepam (ATIVAN) tablet 0.5 mg  0.5 mg Oral TID PRN Ozella Rocksavid J Merrell, MD      . nebivolol (BYSTOLIC)  tablet 20 mg  20 mg Oral BID Kari BaarsEdward Hawkins, MD   20 mg at 04/15/16 0944  . omega-3 acid ethyl esters (LOVAZA) capsule 2 g  2 g Oral Daily Ozella Rocksavid J Merrell, MD   2 g at 04/15/16 0944  . ondansetron (ZOFRAN) tablet 4 mg  4 mg Oral QID PRN Ozella Rocksavid J Merrell, MD      . polyvinyl alcohol (LIQUIFILM TEARS) 1.4 % ophthalmic solution 1 drop  1 drop Both Eyes Q4H Kari BaarsEdward Hawkins, MD   1 drop at 04/15/16 1141  . potassium chloride (K-DUR,KLOR-CON) CR tablet 10 mEq  10 mEq Oral Daily Ozella Rocksavid J Merrell, MD   10 mEq at 04/15/16 0944  .  sodium chloride flush (NS) 0.9 % injection 3 mL  3 mL Intravenous Q12H Ozella Rocksavid J Merrell, MD   3 mL at 04/15/16 0945  . timolol (TIMOPTIC) 0.5 % ophthalmic solution 1 drop  1 drop Both Eyes Daily Kari BaarsEdward Hawkins, MD   1 drop at 04/15/16 0944     Discharge Medications:   TAKE these medications   albuterol 108 (90 Base) MCG/ACT inhaler Commonly known as:  PROVENTIL HFA;VENTOLIN HFA Inhale 1-2 puffs into the lungs every 6 (six) hours as needed for wheezing or shortness of breath.  BYSTOLIC 20 MG Tabs Generic drug:  Nebivolol HCl Take 20 mg by mouth 2 (two) times daily.  cholecalciferol 1000 units tablet Commonly known as:  VITAMIN D Take 3,000 Units by mouth 2 (two) times daily.  diazepam 2 MG tablet Commonly known as:  VALIUM Take 2 mg by mouth 2 (two) times daily as needed (for Meniere's disease).  famotidine 20 MG tablet Commonly known as:  PEPCID Take 1 tablet (20 mg total) by mouth 2 (two) times daily.  guaiFENesin-dextromethorphan 100-10 MG/5ML syrup Commonly known as:  ROBITUSSIN DM Take 5 mLs by mouth every 4 (four) hours as needed for cough.  hydrALAZINE 10 MG tablet Commonly known as:  APRESOLINE Take 10 mg by mouth 2 (two) times daily.  hydrochlorothiazide 25 MG tablet Commonly known as:  HYDRODIURIL Take 25 mg by mouth daily.  LORazepam 0.5 MG tablet Commonly known as:  ATIVAN Take 1 tablet by mouth 3 (three) times daily as needed for anxiety (and/or agitation).  multivitamin-lutein Caps capsule Take 1 capsule by mouth daily.  multivitamins with iron Tabs tablet Take 1 tablet by mouth daily.  omega-3 acid ethyl esters 1 g capsule Commonly known as:  LOVAZA Take 2 g by mouth daily.  ondansetron 4 MG tablet Commonly known as:  ZOFRAN Take 4 mg by mouth 4 (four) times daily as needed for nausea or vomiting.  potassium chloride 10 MEQ tablet Commonly known as:  K-DUR Take 1 tablet (10 mEq total) by mouth daily.  SOOTHE 0.6-0.6 % Soln Generic drug:  Propylene  Glycol-Glycerin Place 1 drop into both eyes 4 (four) times daily.  telmisartan 80 MG tablet Commonly known as:  MICARDIS Take 80 mg by mouth daily.  timolol 0.5 % ophthalmic solution Commonly known as:  BETIMOL Place 1 drop into both eyes daily.     Relevant Imaging Results:  Relevant Lab Results:   Additional Information    Jesi Jurgens, Juleen ChinaHeather D, LCSW

## 2016-04-15 NOTE — Care Management Note (Signed)
Case Management Note  Patient Details  Name: Sabino GasserVirginia P Boeding MRN: [id number] Date of Birth: 22-Jan-1919  Subjective/Objective:                  Pt admitted for GIB. Pt is from [facility]. Anticipate pt will return to ALF at DC. CSW is aware and will make arrangements for return to facility.   Action/Plan: No CM needs anticipated.   Expected Discharge Date:  04/13/16               Expected Discharge Plan:  Assisted Living / Rest Home  In-House Referral:  Clinical Social Work  Discharge planning Services  CM Consult  Post Acute Care Choice:  NA Choice offered to:  NA  DME Arranged:    DME Agency:     HH Arranged:    HH Agency:     Status of Service:  Completed, signed off  If discussed at [employer] Meetings, dates discussed:    Additional Comments:  Malcolm MetroChildress, Ahnya Akre Demske, RN 04/15/2016, 9:55 AM

## 2016-04-15 NOTE — Clinical Social Work Note (Signed)
Clinical Social Work Assessment  Patient Details  Name: Felicia GasserVirginia P Wells MRN: [id number] Date of Birth: 1919-02-25  Date of referral:  04/15/16               Reason for consult:  Other (Comment Required) (From [facility])                Permission sought to share information with:    Permission granted to share information::     Name::        Agency::     Relationship::     Contact Information:     Housing/Transportation Living arrangements for the past 2 months:  Assisted Living Facility Source of Information:  Facility, Other (Comment Required) (Chart review as patient was just assessed on 8/11.) Patient Interpreter Needed:  None Criminal Activity/Legal Involvement Pertinent to Current Situation/Hospitalization:  No - Comment as needed Significant Relationships:  Adult Children Lives with:  Facility Resident Do you feel safe going back to the place where you live?  Yes Need for family participation in patient care:  Yes (Comment)  Care giving concerns:  Facility resident.    Social Worker assessment / plan:  CSW spoke with AnimatorCourtney at [facility].  Patient has been in the facility for years, receives limited assistance, has a supportive family and uses a walker or cane.  Toni AmendCourtney advised that that patient can return to the facility. CSW advised that patient may be discharging today.   Employment status:  Retired Database administratornsurance information:  Managed Medicare PT Recommendations:  Not assessed at this time Information / Referral to community resources:     Patient/Family's Response to care:  Patient has been at the facility for years and family is agreeable for her return.   Patient/Family's Understanding of and Emotional Response to Diagnosis, Current Treatment, and Prognosis:  Family is aware of patient's diagnosis, treatment and prognosis.   Emotional Assessment Appearance:  Appears stated age Attitude/Demeanor/Rapport:   (Cooperative ) Affect (typically observed):   Accepting Orientation:  Oriented to Self Alcohol / Substance use:  Not Applicable Psych involvement (Current and /or in the community):  No (Comment)  Discharge Needs  Concerns to be addressed:  Other (Comment Required (Return to [facility]) Readmission within the last 30 days:  Yes Current discharge risk:  None Barriers to Discharge:  No Barriers Identified   Felicia Wells, Felicia Mcvicker D, LCSW 04/15/2016, 10:56 AM

## 2016-04-15 NOTE — Progress Notes (Signed)
Subjective: She feels better. She did receive IV Lasix last night. She was on IV fluids at 50 mL/h. She says she feels better. Her hemoglobin level is stable.  Objective: Vital signs in last 24 hours: Temp:  [98 F (36.7 C)-98.4 F (36.9 C)] 98.2 F (36.8 C) (08/15 0526) Pulse Rate:  [69-72] 69 (08/15 0526) Resp:  [18-20] 18 (08/15 0526) BP: (118-155)/(49-72) 128/72 (08/15 0526) SpO2:  [91 %-95 %] 95 % (08/15 0526) Weight change:  Last BM Date: 04/13/16  Intake/Output from previous day: 08/14 0701 - 08/15 0700 In: 2190 [P.O.:840; I.V.:1350] Out: 1525 [Urine:1525]  PHYSICAL EXAM General appearance: alert, cooperative and no distress Resp: clear to auscultation bilaterally Cardio: regular rate and rhythm, S1, S2 normal, no murmur, click, rub or gallop GI: soft, non-tender; bowel sounds normal; no masses,  no organomegaly Extremities: extremities normal, atraumatic, no cyanosis or edema  Lab Results:  Results for orders placed or performed during the hospital encounter of 04/12/16 (from the past 48 hour(s))  CBC with Differential/Platelet     Status: Abnormal   Collection Time: 04/14/16  7:20 AM  Result Value Ref Range   WBC 7.7 4.0 - 10.5 K/uL   RBC 2.75 (L) 3.87 - 5.11 MIL/uL   Hemoglobin 8.6 (L) 12.0 - 15.0 g/dL   HCT 95.626.4 (L) 21.336.0 - 08.646.0 %   MCV 96.0 78.0 - 100.0 fL   MCH 31.3 26.0 - 34.0 pg   MCHC 32.6 30.0 - 36.0 g/dL   RDW 57.815.3 46.911.5 - 62.915.5 %   Platelets 212 150 - 400 K/uL   Neutrophils Relative % 65 %   Neutro Abs 5.1 1.7 - 7.7 K/uL   Lymphocytes Relative 20 %   Lymphs Abs 1.5 0.7 - 4.0 K/uL   Monocytes Relative 10 %   Monocytes Absolute 0.8 0.1 - 1.0 K/uL   Eosinophils Relative 4 %   Eosinophils Absolute 0.3 0.0 - 0.7 K/uL   Basophils Relative 1 %   Basophils Absolute 0.1 0.0 - 0.1 K/uL  Hemoglobin and hematocrit, blood     Status: Abnormal   Collection Time: 04/14/16  3:36 PM  Result Value Ref Range   Hemoglobin 9.2 (L) 12.0 - 15.0 g/dL   HCT 52.828.3 (L)  41.336.0 - 46.0 %  CBC with Differential/Platelet     Status: Abnormal   Collection Time: 04/15/16  6:15 AM  Result Value Ref Range   WBC 8.1 4.0 - 10.5 K/uL   RBC 2.96 (L) 3.87 - 5.11 MIL/uL   Hemoglobin 9.2 (L) 12.0 - 15.0 g/dL   HCT 24.428.5 (L) 01.036.0 - 27.246.0 %   MCV 96.3 78.0 - 100.0 fL   MCH 31.1 26.0 - 34.0 pg   MCHC 32.3 30.0 - 36.0 g/dL   RDW 53.615.2 64.411.5 - 03.415.5 %   Platelets 267 150 - 400 K/uL   Neutrophils Relative % 70 %   Neutro Abs 5.7 1.7 - 7.7 K/uL   Lymphocytes Relative 17 %   Lymphs Abs 1.4 0.7 - 4.0 K/uL   Monocytes Relative 9 %   Monocytes Absolute 0.7 0.1 - 1.0 K/uL   Eosinophils Relative 3 %   Eosinophils Absolute 0.2 0.0 - 0.7 K/uL   Basophils Relative 1 %   Basophils Absolute 0.1 0.0 - 0.1 K/uL    ABGS No results for input(s): PHART, PO2ART, TCO2, HCO3 in the last 72 hours.  Invalid input(s): PCO2 CULTURES Recent Results (from the past 240 hour(s))  MRSA PCR Screening  Status: None   Collection Time: 04/09/16  2:50 PM  Result Value Ref Range Status   MRSA by PCR NEGATIVE NEGATIVE Final    Comment:        The GeneXpert MRSA Assay (FDA approved for NASAL specimens only), is one component of a comprehensive MRSA colonization surveillance program. It is not intended to diagnose MRSA infection nor to guide or monitor treatment for MRSA infections.    Studies/Results: No results found.  Medications:  Prior to Admission:  Prescriptions Prior to Admission  Medication Sig Dispense Refill Last Dose  . albuterol (PROVENTIL HFA;VENTOLIN HFA) 108 (90 BASE) MCG/ACT inhaler Inhale 1-2 puffs into the lungs every 6 (six) hours as needed for wheezing or shortness of breath. 1 Inhaler 0 Past Month at Unknown time  . cholecalciferol (VITAMIN D) 1000 UNITS tablet Take 3,000 Units by mouth 2 (two) times daily.    04/12/2016 at Unknown time  . diazepam (VALIUM) 2 MG tablet Take 2 mg by mouth 2 (two) times daily as needed (for Meniere's disease).    Past Month at Unknown  time  . famotidine (PEPCID) 20 MG tablet Take 1 tablet (20 mg total) by mouth 2 (two) times daily. 60 tablet 3 04/12/2016 at Unknown time  . guaiFENesin-dextromethorphan (ROBITUSSIN DM) 100-10 MG/5ML syrup Take 5 mLs by mouth every 4 (four) hours as needed for cough.   Past Month at Unknown time  . hydrALAZINE (APRESOLINE) 10 MG tablet Take 10 mg by mouth 2 (two) times daily.   04/12/2016 at Unknown time  . hydrochlorothiazide (HYDRODIURIL) 25 MG tablet Take 25 mg by mouth daily.   04/12/2016 at Unknown time  . LORazepam (ATIVAN) 0.5 MG tablet Take 1 tablet by mouth 3 (three) times daily as needed for anxiety (and/or agitation).    Past Month at Unknown time  . Multiple Vitamins-Iron (MULTIVITAMINS WITH IRON) TABS tablet Take 1 tablet by mouth daily.   04/12/2016 at Unknown time  . multivitamin-lutein (OCUVITE-LUTEIN) CAPS capsule Take 1 capsule by mouth daily.   04/12/2016 at Unknown time  . Nebivolol HCl (BYSTOLIC) 20 MG TABS Take 20 mg by mouth 2 (two) times daily.   04/12/2016 at Unknown time  . omega-3 acid ethyl esters (LOVAZA) 1 g capsule Take 2 g by mouth daily.   04/12/2016 at Unknown time  . ondansetron (ZOFRAN) 4 MG tablet Take 4 mg by mouth 4 (four) times daily as needed for nausea or vomiting.    Past Month at Unknown time  . potassium chloride (K-DUR) 10 MEQ tablet Take 1 tablet (10 mEq total) by mouth daily. 30 tablet 12 04/12/2016 at Unknown time  . Propylene Glycol-Glycerin (SOOTHE) 0.6-0.6 % SOLN Place 1 drop into both eyes 4 (four) times daily.   04/12/2016 at Unknown time  . telmisartan (MICARDIS) 80 MG tablet Take 80 mg by mouth daily.   04/12/2016 at Unknown time  . timolol (BETIMOL) 0.5 % ophthalmic solution Place 1 drop into both eyes daily.   04/12/2016 at Unknown time   Scheduled: . famotidine  20 mg Oral BID  . hydrALAZINE  10 mg Oral BID  . hydrochlorothiazide  25 mg Oral Daily  . irbesartan  300 mg Oral Daily  . nebivolol  20 mg Oral BID  . omega-3 acid ethyl esters  2 g Oral  Daily  . polyvinyl alcohol  1 drop Both Eyes Q4H  . potassium chloride  10 mEq Oral Daily  . sodium chloride flush  3 mL Intravenous Q12H  .  timolol  1 drop Both Eyes Daily   Continuous:  WUJ:[id number], LORazepam, ondansetron  Assesment: She was admitted with lower GI bleeding which is recurrent. This is presumed to be diverticular. She did not have any chest pain associated with it. She had trouble with increased shortness of breath and crackles in her lungs last night and received Lasix. I have discontinued IV fluids. Her hemoglobin level is stable. Active Problems:   Meniere's disease   Hyponatremia   Lower GI bleed    Plan: Chest x-ray to make sure she doesn't have other problem with her breathing. She had been complaining of an upper respiratory infection earlier    LOS: 0 days   Shainna Faux L 04/15/2016, 8:10 AM

## 2016-04-15 NOTE — Discharge Summary (Signed)
Physician Discharge Summary  Patient ID: Felicia Wells MRN: [id number] DOB/AGE: 80-Mar-1920 80 y.o. Primary Care Physician:Jolicia Delira L, MD Admit date: 04/12/2016 Discharge date: 04/15/2016    Discharge Diagnoses:   Active Problems:   Meniere's disease   Hyponatremia   Lower GI bleed cough Chronic blood loss anemia Dementia hypertension    Medication List    TAKE these medications   albuterol 108 (90 Base) MCG/ACT inhaler Commonly known as:  PROVENTIL HFA;VENTOLIN HFA Inhale 1-2 puffs into the lungs every 6 (six) hours as needed for wheezing or shortness of breath.   BYSTOLIC 20 MG Tabs Generic drug:  Nebivolol HCl Take 20 mg by mouth 2 (two) times daily.   cholecalciferol 1000 units tablet Commonly known as:  VITAMIN D Take 3,000 Units by mouth 2 (two) times daily.   diazepam 2 MG tablet Commonly known as:  VALIUM Take 2 mg by mouth 2 (two) times daily as needed (for Meniere's disease).   famotidine 20 MG tablet Commonly known as:  PEPCID Take 1 tablet (20 mg total) by mouth 2 (two) times daily.   guaiFENesin-dextromethorphan 100-10 MG/5ML syrup Commonly known as:  ROBITUSSIN DM Take 5 mLs by mouth every 4 (four) hours as needed for cough.   hydrALAZINE 10 MG tablet Commonly known as:  APRESOLINE Take 10 mg by mouth 2 (two) times daily.   hydrochlorothiazide 25 MG tablet Commonly known as:  HYDRODIURIL Take 25 mg by mouth daily.   LORazepam 0.5 MG tablet Commonly known as:  ATIVAN Take 1 tablet by mouth 3 (three) times daily as needed for anxiety (and/or agitation).   multivitamin-lutein Caps capsule Take 1 capsule by mouth daily.   multivitamins with iron Tabs tablet Take 1 tablet by mouth daily.   omega-3 acid ethyl esters 1 g capsule Commonly known as:  LOVAZA Take 2 g by mouth daily.   ondansetron 4 MG tablet Commonly known as:  ZOFRAN Take 4 mg by mouth 4 (four) times daily as needed for nausea or vomiting.   potassium chloride 10  MEQ tablet Commonly known as:  K-DUR Take 1 tablet (10 mEq total) by mouth daily.   SOOTHE 0.6-0.6 % Soln Generic drug:  Propylene Glycol-Glycerin Place 1 drop into both eyes 4 (four) times daily.   telmisartan 80 MG tablet Commonly known as:  MICARDIS Take 80 mg by mouth daily.   timolol 0.5 % ophthalmic solution Commonly known as:  BETIMOL Place 1 drop into both eyes daily.       Discharged Condition:improved    Consults:none  Significant Diagnostic Studies: Dg Chest Port 1 View  Result Date: 04/15/2016 CLINICAL DATA:  Shortness of breath EXAM: PORTABLE CHEST 1 VIEW COMPARISON:  October 20, 2013 and September 13, 2013 FINDINGS: Stable cardiomegaly. The thoracic aorta is tortuous but unchanged. The hila and mediastinum are unchanged. No pulmonary nodules, masses, or focal infiltrates. IMPRESSION: No acute interval changes. Electronically Signed   By: Gerome Samavid  Williams III M.D   On: 04/15/2016 11:41    Lab Results: Basic Metabolic Panel:  Recent Labs  40/98/1107/08/18 1938 04/13/16 0636  NA 133* 135  K 3.4* 3.6  CL 98* 99*  CO2 31 31  GLUCOSE 127* 95  BUN 13 13  CREATININE 0.66 0.66  CALCIUM 8.3* 8.3*   Liver Function Tests:  Recent Labs  04/12/16 1938  AST 21  ALT 13*  ALKPHOS 65  BILITOT 0.3  PROT 5.7*  ALBUMIN 3.2*     CBC:  Recent Labs  04/14/16 0720  04/14/16 1536 04/15/16 0615  WBC 7.7  --  8.1  NEUTROABS 5.1  --  5.7  HGB 8.6* 9.2* 9.2*  HCT 26.4* 28.3* 28.5*  MCV 96.0  --  96.3  PLT 212  --  267    Recent Results (from the past 240 hour(s))  MRSA PCR Screening     Status: None   Collection Time: 04/09/16  2:50 PM  Result Value Ref Range Status   MRSA by PCR NEGATIVE NEGATIVE Final    Comment:        The GeneXpert MRSA Assay (FDA approved for NASAL specimens only), is one component of a comprehensive MRSA colonization surveillance program. It is not intended to diagnose MRSA infection nor to guide or monitor treatment for MRSA  infections.      Hospital Course: she had been in the hospital with GI bleeding. She went back to her assisted living facility and was doing well when she had another episode. She came back and was observed. She had a cough. She had stable hemoglobin and no further bleeding. CXR before discharge was clear. She is discharged back to ALF. Her family does not want aggressive treatment.   Discharge Exam: Blood pressure (!) 122/47, pulse 63, temperature 97.9 F (36.6 C), temperature source Oral, resp. rate 20, height 5' 5.5" (1.664 m), weight 60.3 kg (132 lb 15 oz), SpO2 96 %. She is awake and alert and in no distress. Chest is clear and abdomen soft.  Disposition: back to assisted living facility      Signed: Trent Gabler L   04/15/2016, 12:53 PM

## 2016-04-15 NOTE — Progress Notes (Signed)
Pt IV removed, report called to [place]le at [facility]. Answered all questions at this time.  Pt family to  Transport back to facility.

## 2016-04-15 NOTE — Progress Notes (Signed)
Pt has crackles and is increase in shortness of breath.  MD notified.  Order to give 20 mg Lasixs IV once.

## 2017-02-03 ENCOUNTER — Ambulatory Visit (INDEPENDENT_AMBULATORY_CARE_PROVIDER_SITE_OTHER): Payer: Medicare Other | Admitting: Urology

## 2017-02-03 ENCOUNTER — Other Ambulatory Visit (HOSPITAL_COMMUNITY)
Admission: RE | Admit: 2017-02-03 | Discharge: 2017-02-03 | Disposition: A | Discharge status: Home / Self Care | Payer: Medicare Other | Source: Other Acute Inpatient Hospital | Attending: Urology | Admitting: Urology

## 2017-02-03 DIAGNOSIS — R8271 Bacteriuria: Secondary | ICD-10-CM | POA: Diagnosis not present

## 2017-02-03 DIAGNOSIS — N8110 Cystocele, unspecified: Secondary | ICD-10-CM

## 2017-02-04 LAB — URINE CULTURE

## 2017-07-10 ENCOUNTER — Emergency Department (HOSPITAL_COMMUNITY)
Admission: EM | Admit: 2017-07-10 | Discharge: 2017-07-10 | Disposition: A | Discharge status: Home / Self Care | Payer: Medicare Other | Attending: Emergency Medicine | Admitting: Emergency Medicine

## 2017-07-10 ENCOUNTER — Encounter (HOSPITAL_COMMUNITY): Payer: Self-pay | Admitting: Emergency Medicine

## 2017-07-10 DIAGNOSIS — Y92129 Unspecified place in nursing home as the place of occurrence of the external cause: Secondary | ICD-10-CM | POA: Diagnosis not present

## 2017-07-10 DIAGNOSIS — I10 Essential (primary) hypertension: Secondary | ICD-10-CM | POA: Insufficient documentation

## 2017-07-10 DIAGNOSIS — S098XXA Other specified injuries of head, initial encounter: Secondary | ICD-10-CM | POA: Diagnosis present

## 2017-07-10 DIAGNOSIS — S0083XA Contusion of other part of head, initial encounter: Secondary | ICD-10-CM | POA: Diagnosis not present

## 2017-07-10 DIAGNOSIS — Y9389 Activity, other specified: Secondary | ICD-10-CM | POA: Diagnosis not present

## 2017-07-10 DIAGNOSIS — W1839XA Other fall on same level, initial encounter: Secondary | ICD-10-CM | POA: Diagnosis not present

## 2017-07-10 DIAGNOSIS — Y999 Unspecified external cause status: Secondary | ICD-10-CM | POA: Insufficient documentation

## 2017-07-10 DIAGNOSIS — Z79899 Other long term (current) drug therapy: Secondary | ICD-10-CM | POA: Diagnosis not present

## 2017-07-10 DIAGNOSIS — W19XXXA Unspecified fall, initial encounter: Secondary | ICD-10-CM

## 2017-07-10 MED ORDER — BACITRACIN ZINC 500 UNIT/GM EX OINT
1.0000 "application " | TOPICAL_OINTMENT | Freq: Two times a day (BID) | CUTANEOUS | Status: DC
  Administered 2017-07-10: 1 via TOPICAL
  Filled 2017-07-10: qty 0.9

## 2017-07-10 MED ORDER — ACETAMINOPHEN 500 MG PO TABS
1000.0000 mg | ORAL_TABLET | Freq: Once | ORAL | Status: AC
Start: 2017-07-10 — End: 2017-07-10
  Administered 2017-07-10: 1000 mg via ORAL
  Filled 2017-07-10: qty 2

## 2017-07-10 NOTE — ED Triage Notes (Signed)
Pt c/o tripping and falling this am. Abrasions to forehead noted. Pt denies loc. Denies pain. Ambulated to stretcher per EMS.

## 2017-07-10 NOTE — ED Notes (Signed)
Pt reports she was speaking to her daughter in [place] when she tripped and fell States she did not pass out  She is conversant and states she is quite comfortable and has no pain   She has a small abrasion to her L forehead with bleeding controlled

## 2017-07-10 NOTE — ED Provider Notes (Signed)
[facility] EMERGENCY DEPARTMENT Provider Note   CSN: [id number] Arrival date & time: 07/10/17  1129     History   Chief Complaint Chief Complaint  Patient presents with  . Fall    no compliant    HPI Felicia Wells is a 83 y.o. female.  HPI  The patient is a 83 year old female, she is not on any anticoagulants, she does have some dementia, she lives in more of an assisted care facility where she was today when she had a fall, she states that she was on the phone, lost her balance and fell to the ground striking her head on either the wall or the floor, she cannot remember which.  She denies a loss of consciousness, denies nausea or vomiting, denies seizures, numbness, weakness.  She has some difficulty getting back up to her feet but was assisted.  At this time the patient states that she has no headache, she does have a small contusion to the forehead.  She denies any pain in her arms legs chest abdomen or pelvis.  She has no difficult he breathing.  This occurred just prior to arrival, the symptoms were acute in onset, the symptoms are mild, persistent, worse with palpation of the forehead.  Past Medical History:  Diagnosis Date  . Anxiety   . Eye problem   . GERD (gastroesophageal reflux disease)   . Hypertension   . Infections of kidney   . Osteoporosis     Patient Active Problem List   Diagnosis Date Noted  . Dementia with behavioral disturbance 04/11/2016  . Lower GI bleed 04/09/2016  . Diverticulosis large intestine w/o perforation or abscess w/bleeding   . Palliative care encounter   . Hematochezia 09/12/2015  . DNR (do not resuscitate) 09/12/2015  . Bleeding gastrointestinal   . CAP (community acquired pneumonia) 12/04/2012  . Hyponatremia 12/04/2012  . Muscular deconditioning 12/04/2012  . Acute delirium 12/04/2012  . Fracture of acetabulum (HCC) 07/19/2012  . Essential hypertension 07/19/2012  . Meniere's disease 07/19/2012  . Muscle weakness  (generalized) 06/03/2011  . Difficulty in walking(719.7) 06/03/2011    Past Surgical History:  Procedure Laterality Date  . brain shunt    . CATARACT EXTRACTION    . DILATION AND CURETTAGE OF UTERUS    . EYE SURGERY    . FOOT SURGERY    . HERNIA REPAIR    . THYROID SURGERY    . TONSILLECTOMY      OB History    No data available       Home Medications    Prior to Admission medications   Medication Sig Start Date End Date Taking? Authorizing Provider  albuterol (PROVENTIL HFA;VENTOLIN HFA) 108 (90 BASE) MCG/ACT inhaler Inhale 1-2 puffs into the lungs every 6 (six) hours as needed for wheezing or shortness of breath. 09/13/13   Donnetta Hutchingook, Dechelle Attaway, MD  cholecalciferol (VITAMIN D) 1000 UNITS tablet Take 3,000 Units by mouth 2 (two) times daily.     [provider]  diazepam (VALIUM) 2 MG tablet Take 2 mg by mouth 2 (two) times daily as needed (for Meniere's disease).     [provider]  famotidine (PEPCID) 20 MG tablet Take 1 tablet (20 mg total) by mouth 2 (two) times daily. 09/14/15   Avon GullyFanta, Tesfaye, MD  guaiFENesin-dextromethorphan (ROBITUSSIN DM) 100-10 MG/5ML syrup Take 5 mLs by mouth every 4 (four) hours as needed for cough.    [provider]  hydrALAZINE (APRESOLINE) 10 MG tablet Take 10 mg  by mouth 2 (two) times daily.    [provider]  hydrochlorothiazide (HYDRODIURIL) 25 MG tablet Take 25 mg by mouth daily.    [provider]  LORazepam (ATIVAN) 0.5 MG tablet Take 1 tablet by mouth 3 (three) times daily as needed for anxiety (and/or agitation).     [provider]  Multiple Vitamins-Iron (MULTIVITAMINS WITH IRON) TABS tablet Take 1 tablet by mouth daily.    [provider]  multivitamin-lutein (OCUVITE-LUTEIN) CAPS capsule Take 1 capsule by mouth daily.    [provider]  Nebivolol HCl (BYSTOLIC) 20 MG TABS Take 20 mg by mouth 2 (two) times daily.    [provider]  omega-3 acid ethyl esters  (LOVAZA) 1 g capsule Take 2 g by mouth daily.    [provider]  ondansetron (ZOFRAN) 4 MG tablet Take 4 mg by mouth 4 (four) times daily as needed for nausea or vomiting.  09/07/15   [provider]  potassium chloride (K-DUR) 10 MEQ tablet Take 1 tablet (10 mEq total) by mouth daily. 12/04/12   Kari Baars, MD  Propylene Glycol-Glycerin (SOOTHE) 0.6-0.6 % SOLN Place 1 drop into both eyes 4 (four) times daily.    [provider]  telmisartan (MICARDIS) 80 MG tablet Take 80 mg by mouth daily.    [provider]  timolol (BETIMOL) 0.5 % ophthalmic solution Place 1 drop into both eyes daily.    [provider]    Family History No family history on file.  Social History Social History   Tobacco Use  . Smoking status: Never Smoker  . Smokeless tobacco: Never Used  Substance Use Topics  . Alcohol use: No  . Drug use: No     Allergies   Alendronate; Butazolidin [phenylbutazone]; Codeine; Darvocet [propoxyphene n-acetaminophen]; Demerol; Ezetimibe-simvastatin; Hydrochlorothiazide w-triamterene; Meperidine; Morphine and related; Niacin; Oxybutynin chloride; Prednisone; Propoxyphene; Raloxifene; Risedronate; Simvastatin; Diltiazem hcl; Lansoprazole; Pravastatin; Sulfa antibiotics; and Teriparatide   Review of Systems Review of Systems  All other systems reviewed and are negative.    Physical Exam Updated Vital Signs BP (!) 122/91   Pulse 80   Temp 97.8 F (36.6 C) (Oral)   Resp 16   Ht 5\' 4"  (1.626 m)   Wt 59.9 kg (132 lb)   SpO2 90%   BMI 22.66 kg/m   Physical Exam  Constitutional: She appears well-developed and well-nourished. No distress.  HENT:  Head: Normocephalic.  Mouth/Throat: Oropharynx is clear and moist. No oropharyngeal exudate.  Small hematoma to the left frontal scalp, abrasion associated with this  Eyes: Conjunctivae and EOM are normal. Pupils are equal, round, and reactive to light. Right eye exhibits no  discharge. Left eye exhibits no discharge. No scleral icterus.  Neck: Normal range of motion. Neck supple. No JVD present. No thyromegaly present.  Cardiovascular: Normal rate, regular rhythm, normal heart sounds and intact distal pulses. Exam reveals no gallop and no friction rub.  No murmur heard. Pulmonary/Chest: Effort normal and breath sounds normal. No respiratory distress. She has no wheezes. She has no rales.  Abdominal: Soft. Bowel sounds are normal. She exhibits no distension and no mass. There is no tenderness.  Musculoskeletal: Normal range of motion. She exhibits no edema or tenderness.  Lymphadenopathy:    She has no cervical adenopathy.  Neurological: She is alert. Coordination normal.  The patient has cranial nerves III through XII which are normal, speech is clear, extraocular movements are intact, pupils are round reactive to light, normal strength in  all 4 extremities, follows commands without difficulty.  Skin: Skin is warm and dry. No rash noted. No erythema.  Psychiatric: She has a normal mood and affect. Her behavior is normal.  Nursing note and vitals reviewed.    ED Treatments / Results  Labs (all labs ordered are listed, but only abnormal results are displayed) Labs Reviewed - No data to display  Radiology No results found.  Procedures Procedures (including critical care time)  Medications Ordered in ED Medications  bacitracin ointment 1 application (not administered)  acetaminophen (TYLENOL) tablet 1,000 mg (not administered)     Initial Impression / Assessment and Plan / ED Course  I have reviewed the triage vital signs and the nursing notes.  Pertinent labs & imaging results that were available during my care of the patient were reviewed by me and considered in my medical decision making (see chart for details).     Discussed with patient and her family member, they declined CT scan imaging which I think is very appropriate at this time as she is  not anticoagulated, she has DO NOT RESUSCITATE orders and would not be a good surgical candidate anyway.  Wound care provided, patient stable to be discharged back to her assisted care facility.  Final Clinical Impressions(s) / ED Diagnoses   Final diagnoses:  Contusion of other part of head, initial encounter  Fall, initial encounter    ED Discharge Orders    None       Eber HongMiller, Kolbie Clarkston, MD 07/10/17 1153

## 2017-07-10 NOTE — Discharge Instructions (Signed)
After discussion you have chosen not to have any x-rays of your brain.  Please take Tylenol  as needed for pain.  Keep an ice pack on your forehead to help with the swelling of the hematoma.  This may take 1-2 weeks to heal up.  Keep antibiotic ointment on your wound with a sterile dressing.  You may return to the emergency department for severe or worsening symptoms.

## 2017-07-21 ENCOUNTER — Emergency Department (HOSPITAL_COMMUNITY): Payer: Medicare Other

## 2017-07-21 ENCOUNTER — Emergency Department (HOSPITAL_COMMUNITY)
Admission: EM | Admit: 2017-07-21 | Discharge: 2017-07-21 | Disposition: A | Discharge status: Home / Self Care | Payer: Medicare Other | Attending: Emergency Medicine | Admitting: Emergency Medicine

## 2017-07-21 ENCOUNTER — Encounter (HOSPITAL_COMMUNITY): Payer: Self-pay | Admitting: *Deleted

## 2017-07-21 ENCOUNTER — Other Ambulatory Visit: Payer: Self-pay

## 2017-07-21 DIAGNOSIS — M7989 Other specified soft tissue disorders: Secondary | ICD-10-CM

## 2017-07-21 DIAGNOSIS — I10 Essential (primary) hypertension: Secondary | ICD-10-CM | POA: Insufficient documentation

## 2017-07-21 DIAGNOSIS — F0391 Unspecified dementia with behavioral disturbance: Secondary | ICD-10-CM | POA: Diagnosis not present

## 2017-07-21 DIAGNOSIS — R609 Edema, unspecified: Secondary | ICD-10-CM | POA: Diagnosis not present

## 2017-07-21 DIAGNOSIS — R2241 Localized swelling, mass and lump, right lower limb: Secondary | ICD-10-CM | POA: Diagnosis present

## 2017-07-21 DIAGNOSIS — Z79899 Other long term (current) drug therapy: Secondary | ICD-10-CM | POA: Diagnosis not present

## 2017-07-21 LAB — CBC WITH DIFFERENTIAL/PLATELET
Basophils Absolute: 0.1 10*3/uL (ref 0.0–0.1)
Basophils Relative: 1 %
Eosinophils Absolute: 0.1 10*3/uL (ref 0.0–0.7)
Eosinophils Relative: 2 %
HCT: 40.6 % (ref 36.0–46.0)
Hemoglobin: 12.3 g/dL (ref 12.0–15.0)
Lymphocytes Relative: 16 %
Lymphs Abs: 1.3 10*3/uL (ref 0.7–4.0)
MCH: 30.3 pg (ref 26.0–34.0)
MCHC: 30.3 g/dL (ref 30.0–36.0)
MCV: 100 fL (ref 78.0–100.0)
Monocytes Absolute: 0.8 10*3/uL (ref 0.1–1.0)
Monocytes Relative: 11 %
Neutro Abs: 5.5 10*3/uL (ref 1.7–7.7)
Neutrophils Relative %: 70 %
Platelets: 218 10*3/uL (ref 150–400)
RBC: 4.06 MIL/uL (ref 3.87–5.11)
RDW: 15.4 % (ref 11.5–15.5)
WBC: 7.8 10*3/uL (ref 4.0–10.5)

## 2017-07-21 LAB — BASIC METABOLIC PANEL
ANION GAP: 8 (ref 5–15)
BUN: 34 mg/dL — AB (ref 6–20)
CALCIUM: 8.7 mg/dL — AB (ref 8.9–10.3)
CO2: 30 mmol/L (ref 22–32)
Chloride: 99 mmol/L — ABNORMAL LOW (ref 101–111)
Creatinine, Ser: 1.04 mg/dL — ABNORMAL HIGH (ref 0.44–1.00)
GFR calc Af Amer: 51 mL/min — ABNORMAL LOW (ref >60)
GFR, EST NON AFRICAN AMERICAN: 44 mL/min — AB (ref >60)
GLUCOSE: 102 mg/dL — AB (ref 65–99)
Potassium: 3.7 mmol/L (ref 3.5–5.1)
SODIUM: 137 mmol/L (ref 135–145)

## 2017-07-21 LAB — BRAIN NATRIURETIC PEPTIDE: B NATRIURETIC PEPTIDE 5: 630 pg/mL — AB (ref 0.0–100.0)

## 2017-07-21 MED ORDER — FUROSEMIDE 20 MG PO TABS: 20.0000 mg | ORAL_TABLET | Freq: Every day | ORAL | 0 refills | Status: DC

## 2017-07-21 NOTE — Discharge Instructions (Signed)
You were seen in the ED today with leg swelling. Your labs are normal. There is no evidence of blood clot. We are starting you on Lasix for the next 5 days. You should return to the ED with any chest pain or difficulty breathing. Keep the legs wrapped and elevated when sitting.   Call your PCP tomorrow morning to discuss you ED visit and follow up plan.

## 2017-07-21 NOTE — ED Triage Notes (Signed)
Pt brought in by [facility] for c/o redness and swelling to bilateral lower legs; pt states the symptoms started x 2 days ago; right leg has more swelling and left leg has more drainage

## 2017-07-21 NOTE — ED Provider Notes (Signed)
Emergency Department Provider Note   I have reviewed the triage vital signs and the nursing notes.   HISTORY  Chief Complaint Leg Swelling   HPI Felicia Wells is a 83 y.o. female with PMH of GERD, HTN, and anxiety presents to the emergency department for evaluation of lower extremity swelling.  The patient is currently at an assisted living facility.  Family was visiting today and noticed her legs were red and swollen.  Patient states that she has had swelling in her legs before.  Family state that last time this happened she was on a short course of Lasix, compression stockings, elevation and symptoms are improved dramatically.  She is not on maintenance Lasix.  Family apparently called the PCP, Dr. Juanetta GoslingHawkins, who referred them to the ED. she denies any difficulty breathing, chest pain, fevers, chills. No modifying factors.    Past Medical History:  Diagnosis Date  . Anxiety   . Eye problem   . GERD (gastroesophageal reflux disease)   . Hypertension   . Infections of kidney   . Osteoporosis     Patient Active Problem List   Diagnosis Date Noted  . Dementia with behavioral disturbance 04/11/2016  . Lower GI bleed 04/09/2016  . Diverticulosis large intestine w/o perforation or abscess w/bleeding   . Palliative care encounter   . Hematochezia 09/12/2015  . DNR (do not resuscitate) 09/12/2015  . Bleeding gastrointestinal   . CAP (community acquired pneumonia) 12/04/2012  . Hyponatremia 12/04/2012  . Muscular deconditioning 12/04/2012  . Acute delirium 12/04/2012  . Fracture of acetabulum (HCC) 07/19/2012  . Essential hypertension 07/19/2012  . Meniere's disease 07/19/2012  . Muscle weakness (generalized) 06/03/2011  . Difficulty in walking(719.7) 06/03/2011    Past Surgical History:  Procedure Laterality Date  . brain shunt    . CATARACT EXTRACTION    . DILATION AND CURETTAGE OF UTERUS    . EYE SURGERY    . FOOT SURGERY    . HERNIA REPAIR    . THYROID SURGERY     . TONSILLECTOMY      Current Outpatient Rx  . Order #: [id number] Class: Print  . Order #: [id number] Class: Historical Med  . Order #: [id number] Class: Historical Med  . Order #: [id number] Class: Print  . Order #: [id number] Class: Historical Med  . Order #: [id number] Class: Historical Med  . Order #: [id number] Class: Historical Med  . Order #: [id number] Class: Historical Med  . Order #: [id number] Class: Historical Med  . Order #: [id number] Class: Historical Med  . Order #: [id number] Class: Historical Med  . Order #: [id number] Class: Historical Med  . Order #: [id number] Class: Historical Med  . Order #: [id number] Class: Normal  . Order #: [id number] Class: Historical Med  . Order #: [id number] Class: Historical Med  . Order #: [id number] Class: Historical Med  . Order #: [id number] Class: Print    Allergies Alendronate; Butazolidin [phenylbutazone]; Codeine; Darvocet [propoxyphene n-acetaminophen]; Ezetimibe-simvastatin; Meperidine; Morphine and related; Niacin; Oxybutynin chloride; Prednisone; Propoxyphene; Raloxifene; Risedronate; Simvastatin; Triamterene; Diltiazem hcl; Lansoprazole; Pravastatin; Sulfa antibiotics; and Teriparatide  History reviewed. No pertinent family history.  Social History Social History   Tobacco Use  . Smoking status: Never Smoker  . Smokeless tobacco: Never Used  Substance Use Topics  . Alcohol use: No  . Drug use: No    Review of Systems  Constitutional: No fever/chills Eyes: No visual changes. ENT: No sore throat. Cardiovascular: Denies chest pain. Respiratory: Denies shortness of breath. Gastrointestinal: No abdominal pain.  No nausea, no vomiting.  No diarrhea.  No constipation.  Genitourinary: Negative for dysuria. Musculoskeletal: Negative for back pain. Positive B/L LE edema.  Skin: Redness over the bilateral LEs.  Neurological: Negative for headaches, focal weakness or numbness.  10-point ROS otherwise  negative.  ____________________________________________   PHYSICAL EXAM:  VITAL SIGNS: ED Triage Vitals  Enc Vitals Group     BP 07/21/17 1722 (!) 121/57     Pulse Rate 07/21/17 1722 70     Resp 07/21/17 1722 16     Temp 07/21/17 1722 98 F (36.7 C)     Temp Source 07/21/17 1722 Oral     SpO2 07/21/17 1722 96 %     Weight 07/21/17 1720 132 lb (59.9 kg)     Height 07/21/17 1720 5\' 4"  (1.626 m)   Constitutional: Alert and oriented. Well appearing and in no acute distress. Eyes: Conjunctivae are normal.  Head: Atraumatic. Nose: No congestion/rhinnorhea. Mouth/Throat: Mucous membranes are moist.  Neck: No stridor.  Cardiovascular: Normal rate, regular rhythm. Good peripheral circulation. Grossly normal heart sounds.   Respiratory: Normal respiratory effort.  No retractions. Lungs CTAB. Gastrointestinal: Soft and nontender. No distention.  Musculoskeletal: No lower extremity tenderness. Bilateral LE edema but R>L. No drainage or induration. No gross deformities of extremities. Neurologic:  Normal speech and language. No gross focal neurologic deficits are appreciated.  Skin:  Skin is warm, dry and intact. Pitting edema in the LEs. Right greater left. Diffuse erythema bilaterally with no warmth to touch. No cellulitis.   ____________________________________________   LABS (all labs ordered are listed, but only abnormal results are displayed)  Labs Reviewed  BASIC METABOLIC PANEL - Abnormal; Notable for the following components:      Result Value   Chloride 99 (*)    Glucose, Bld 102 (*)    BUN 34 (*)    Creatinine, Ser 1.04 (*)    Calcium 8.7 (*)    GFR calc non Af Amer 44 (*)    GFR calc Af Amer 51 (*)    All other components within normal limits  BRAIN NATRIURETIC PEPTIDE - Abnormal; Notable for the following components:   B Natriuretic Peptide 630.0 (*)    All other components within normal limits  CBC WITH DIFFERENTIAL/PLATELET    ____________________________________________  RADIOLOGY  US Venous Img Lower Right (dvt Study)  Result Date: 07/21/2017 CLINICAL DATA:  Right lower extremity swelling and pain for 2 days, initial encounter EXAM: RIGHT LOWER EXTREMITY VENOUS DOPPLER ULTRASOUND TECHNIQUE: Gray-scale sonography with graded compression, as well as color Doppler and duplex ultrasound were performed to evaluate the lower extremity deep venous systems from the level of the common femoral vein and including the common femoral, femoral, profunda femoral, popliteal and calf veins including the posterior tibial, peroneal and gastrocnemius veins when visible. The superficial great saphenous vein was also interrogated. Spectral Doppler was utilized to evaluate flow at rest and with distal augmentation maneuvers in the common femoral, femoral and popliteal veins. COMPARISON:  None. FINDINGS: Contralateral Common Femoral Vein: Respiratory phasicity is normal and symmetric with the symptomatic side. No evidence of thrombus. Normal compressibility. Common Femoral Vein: No evidence of thrombus. Normal compressibility, respiratory phasicity and response to augmentation. Saphenofemoral Junction: No evidence of thrombus. Normal compressibility and flow on color Doppler imaging. Profunda Femoral Vein: No evidence of thrombus. Normal compressibility and flow on color Doppler imaging. Femoral Vein: No evidence of thrombus. Normal compressibility, respiratory phasicity and response to augmentation. Popliteal Vein: No evidence of thrombus. Normal compressibility, respiratory phasicity and response to augmentation. Calf Veins: No  evidence of thrombus. Normal compressibility and flow on color Doppler imaging. Superficial Great Saphenous Vein: No evidence of thrombus. Normal compressibility. Venous Reflux:  None. Other Findings: Popliteal cyst is noted measuring 7 cm in greatest dimension. Right calf edema is noted as well. IMPRESSION: No evidence of  deep venous thrombosis. Right popliteal cyst. Electronically Signed   By: Alcide CleverMark  Lukens M.D.   On: 07/21/2017 18:51    ____________________________________________   PROCEDURES  Procedure(s) performed:   Procedures  None ____________________________________________   INITIAL IMPRESSION / ASSESSMENT AND PLAN / ED COURSE  Pertinent labs & imaging results that were available during my care of the patient were reviewed by me and considered in my medical decision making (see chart for details).  Patient presents to the ED for evaluation of LE swelling with the right > left. No dyspnea or CP.  No infection symptoms.  Patient's redness is most consistent with venous stasis dermatitis rather than cellulitis.  The right leg is more swollen than the left on my exam.  Plan for ultrasound to evaluate for DVT. Normal vitals. No dyspnea. No chest x-ray at this time.   Normal DVT screening US. BNP elevated but patient with no respiratory symptoms. Had similar episode in the past which resolved with wrapping legs, elevation, and short course of lasix. Creatinine normal. Will plan for 5 days of low-dose lasix, compression stockings, and elevation. Will follow with PCP.   At this time, I do not feel there is any life-threatening condition present. I have reviewed and discussed all results (EKG, imaging, lab, urine as appropriate), exam findings with patient. I have reviewed nursing notes and appropriate previous records.  I feel the patient is safe to be discharged home without further emergent workup. Discussed usual and customary return precautions. Patient and family (if present) verbalize understanding and are comfortable with this plan.  Patient will follow-up with their primary care provider. If they do not have a primary care provider, information for follow-up has been provided to them. All questions have been answered.  ____________________________________________  FINAL CLINICAL IMPRESSION(S) /  ED DIAGNOSES  Final diagnoses:  Peripheral edema  Right leg swelling     MEDICATIONS GIVEN DURING THIS VISIT:  None  NEW OUTPATIENT MEDICATIONS STARTED DURING THIS VISIT:  Lasix 20 mg PO daily x 5 days   Note:  This document was prepared using Dragon voice recognition software and may include unintentional dictation errors.  Alona BeneJoshua Long, MD Emergency Medicine    Long, Arlyss RepressJoshua G, MD 07/22/17 [phone]

## 2019-06-21 ENCOUNTER — Emergency Department (HOSPITAL_COMMUNITY): Payer: Medicare Other

## 2019-06-21 ENCOUNTER — Other Ambulatory Visit: Payer: Self-pay

## 2019-06-21 ENCOUNTER — Emergency Department (HOSPITAL_COMMUNITY)
Admission: EM | Admit: 2019-06-21 | Discharge: 2019-06-22 | Disposition: A | Discharge status: Home / Self Care | Payer: Medicare Other | Attending: Emergency Medicine | Admitting: Emergency Medicine

## 2019-06-21 ENCOUNTER — Encounter (HOSPITAL_COMMUNITY): Payer: Self-pay

## 2019-06-21 DIAGNOSIS — R4182 Altered mental status, unspecified: Secondary | ICD-10-CM | POA: Diagnosis present

## 2019-06-21 DIAGNOSIS — Z79899 Other long term (current) drug therapy: Secondary | ICD-10-CM | POA: Diagnosis not present

## 2019-06-21 DIAGNOSIS — I10 Essential (primary) hypertension: Secondary | ICD-10-CM | POA: Insufficient documentation

## 2019-06-21 DIAGNOSIS — N3000 Acute cystitis without hematuria: Secondary | ICD-10-CM | POA: Insufficient documentation

## 2019-06-21 DIAGNOSIS — E049 Nontoxic goiter, unspecified: Secondary | ICD-10-CM

## 2019-06-21 DIAGNOSIS — R410 Disorientation, unspecified: Secondary | ICD-10-CM

## 2019-06-21 HISTORY — DX: Unspecified glaucoma: H40.9

## 2019-06-21 HISTORY — DX: Gastrointestinal hemorrhage, unspecified: K92.2

## 2019-06-21 LAB — CBC WITH DIFFERENTIAL/PLATELET
Abs Immature Granulocytes: 0.05 10*3/uL (ref 0.00–0.07)
Basophils Absolute: 0.1 10*3/uL (ref 0.0–0.1)
Basophils Relative: 1 %
Eosinophils Absolute: 0.2 10*3/uL (ref 0.0–0.5)
Eosinophils Relative: 2 %
HCT: 36.5 % (ref 36.0–46.0)
Hemoglobin: 11.3 g/dL — ABNORMAL LOW (ref 12.0–15.0)
Immature Granulocytes: 1 %
Lymphocytes Relative: 18 %
Lymphs Abs: 1.5 10*3/uL (ref 0.7–4.0)
MCH: 30.5 pg (ref 26.0–34.0)
MCHC: 31 g/dL (ref 30.0–36.0)
MCV: 98.6 fL (ref 80.0–100.0)
Monocytes Absolute: 0.8 10*3/uL (ref 0.1–1.0)
Monocytes Relative: 10 %
Neutro Abs: 5.7 10*3/uL (ref 1.7–7.7)
Neutrophils Relative %: 68 %
Platelets: 264 10*3/uL (ref 150–400)
RBC: 3.7 MIL/uL — ABNORMAL LOW (ref 3.87–5.11)
RDW: 14.5 % (ref 11.5–15.5)
WBC: 8.2 10*3/uL (ref 4.0–10.5)
nRBC: 0 % (ref 0.0–0.2)

## 2019-06-21 LAB — COMPREHENSIVE METABOLIC PANEL
ALT: 12 U/L (ref 0–44)
AST: 20 U/L (ref 15–41)
Albumin: 3.9 g/dL (ref 3.5–5.0)
Alkaline Phosphatase: 65 U/L (ref 38–126)
Anion gap: 9 (ref 5–15)
BUN: 26 mg/dL — ABNORMAL HIGH (ref 8–23)
CO2: 26 mmol/L (ref 22–32)
Calcium: 8.7 mg/dL — ABNORMAL LOW (ref 8.9–10.3)
Chloride: 97 mmol/L — ABNORMAL LOW (ref 98–111)
Creatinine, Ser: 1.34 mg/dL — ABNORMAL HIGH (ref 0.44–1.00)
GFR calc Af Amer: 38 mL/min — ABNORMAL LOW (ref >60)
GFR calc non Af Amer: 33 mL/min — ABNORMAL LOW (ref >60)
Glucose, Bld: 117 mg/dL — ABNORMAL HIGH (ref 70–99)
Potassium: 3.8 mmol/L (ref 3.5–5.1)
Sodium: 132 mmol/L — ABNORMAL LOW (ref 135–145)
Total Bilirubin: 0.6 mg/dL (ref 0.3–1.2)
Total Protein: 6.9 g/dL (ref 6.5–8.1)

## 2019-06-21 LAB — RAPID URINE DRUG SCREEN, HOSP PERFORMED
Amphetamines: NOT DETECTED
Barbiturates: NOT DETECTED
Benzodiazepines: NOT DETECTED
Cocaine: NOT DETECTED
Opiates: NOT DETECTED
Tetrahydrocannabinol: NOT DETECTED

## 2019-06-21 LAB — URINALYSIS, ROUTINE W REFLEX MICROSCOPIC
Bilirubin Urine: NEGATIVE
Glucose, UA: NEGATIVE mg/dL
Hgb urine dipstick: NEGATIVE
Ketones, ur: 5 mg/dL — AB
Nitrite: NEGATIVE
Protein, ur: NEGATIVE mg/dL
Specific Gravity, Urine: 1.012 (ref 1.005–1.030)
WBC, UA: >50 WBC/hpf — ABNORMAL HIGH (ref 0–5)
pH: 5 (ref 5.0–8.0)

## 2019-06-21 LAB — AMMONIA: Ammonia: 14 umol/L (ref 9–35)

## 2019-06-21 MED ORDER — CEPHALEXIN 250 MG PO CAPS: 250.0000 mg | ORAL_CAPSULE | Freq: Four times a day (QID) | ORAL | 0 refills | Status: AC

## 2019-06-21 MED ORDER — CEPHALEXIN 250 MG PO CAPS
250.0000 mg | ORAL_CAPSULE | Freq: Once | ORAL | Status: AC
  Administered 2019-06-21: 250 mg via ORAL
  Filled 2019-06-21 (×2): qty 1

## 2019-06-21 NOTE — ED Triage Notes (Signed)
Pt resident of [facility].  Staff reports pt has been confused, throwing things, and hallucinating. Staff says pt usually alert and oriented.  Reports has been trying to get a urine sample all day but has been unsuccessful.  Reports pt has voided but has been unable to catch specimen.

## 2019-06-21 NOTE — ED Provider Notes (Addendum)
[facility] EMERGENCY DEPARTMENT Provider Note   CSN: [id number] Arrival date & time: 06/21/19  1646     History   Chief Complaint Chief Complaint  Patient presents with   Altered Mental Status    HPI Felicia Wells is a 85 y.o. female presenting for altered mental status.  Level 5 caveat due to altered mental status and dementia.  Per facility staff, patient became acutely altered last night.  States she was hallucinating.  Per staff, she is normally alert to person and place.  She takes care of herself at her facility, performing her own ADLs.  Per staff, no recent falls or injuries.  No fevers, cough, vomiting.  No medication changes.  Additional history obtained from chart review.  Patient with a history of anxiety, GERD, hypertension, dementia     HPI  Past Medical History:  Diagnosis Date   Anxiety    Anxiety    Eye problem    GERD (gastroesophageal reflux disease)    GI hemorrhage    Glaucoma    Hypertension    Infections of kidney    Osteoporosis     Patient Active Problem List   Diagnosis Date Noted   Dementia with behavioral disturbance ([place]) 04/11/2016   Lower GI bleed 04/09/2016   Diverticulosis large intestine w/o perforation or abscess w/bleeding    Palliative care encounter    Hematochezia 09/12/2015   DNR (do not resuscitate) 09/12/2015   Bleeding gastrointestinal    CAP (community acquired pneumonia) 12/04/2012   Hyponatremia 12/04/2012   Muscular deconditioning 12/04/2012   Acute delirium 12/04/2012   Fracture of acetabulum ([place]) 07/19/2012   Essential hypertension 07/19/2012   Meniere's disease 07/19/2012   Muscle weakness (generalized) 06/03/2011   Difficulty in walking(719.7) 06/03/2011    Past Surgical History:  Procedure Laterality Date   brain shunt     CATARACT EXTRACTION     DILATION AND CURETTAGE OF UTERUS     EYE SURGERY     FOOT SURGERY     HERNIA REPAIR     THYROID SURGERY      TONSILLECTOMY       OB History   No obstetric history on file.      Home Medications    Prior to Admission medications   Medication Sig Start Date End Date Taking? Authorizing Provider  cholecalciferol (VITAMIN D) 1000 UNITS tablet Take 500 Units by mouth 2 (two) times daily.    Yes [provider]  famotidine (PEPCID) 20 MG tablet Take 1 tablet (20 mg total) by mouth 2 (two) times daily. Patient taking differently: Take 20 mg by mouth daily.  09/14/15  Yes Rosita Fire, MD  furosemide (LASIX) 20 MG tablet Take 20 mg by mouth daily.   Yes [provider]  hydrALAZINE (APRESOLINE) 10 MG tablet Take 10 mg by mouth 2 (two) times daily.   Yes [provider]  meclizine (ANTIVERT) 25 MG tablet Take 25 mg by mouth 3 (three) times daily as needed for dizziness.   Yes [provider]  Multiple Vitamins-Iron (MULTIVITAMINS WITH IRON) TABS tablet Take 1 tablet by mouth daily.   Yes [provider]  multivitamin-lutein (OCUVITE-LUTEIN) CAPS capsule Take 1 capsule by mouth daily.   Yes [provider]  Nebivolol HCl (BYSTOLIC) 20 MG TABS Take 20 mg by mouth 2 (two) times daily.   Yes [provider]  omega-3 acid ethyl esters (LOVAZA) 1 g capsule Take 2 g by mouth daily.   Yes [provider]  ondansetron (ZOFRAN) 4 MG tablet Take 4 mg by mouth 4 (four) times daily as needed for nausea or vomiting.  09/07/15  Yes [provider]  potassium chloride (K-DUR) 10 MEQ tablet Take 1 tablet (10 mEq total) by mouth daily. 12/04/12  Yes Kari Baars, MD  Propylene Glycol-Glycerin (SOOTHE) 0.6-0.6 % SOLN Place 1 drop into both eyes 4 (four) times daily.   Yes [provider]  telmisartan (MICARDIS) 80 MG tablet Take 80 mg by mouth daily.   Yes [provider]  timolol (BETIMOL) 0.5 % ophthalmic solution Place 1 drop into both eyes daily.   Yes [provider]  cephALEXin (KEFLEX) 250 MG capsule Take 1  capsule (250 mg total) by mouth 4 (four) times daily for 7 days. 06/21/19 06/28/19  Virgina Deakins, PA-C    Family History No family history on file.  Social History Social History   Tobacco Use   Smoking status: Never Smoker   Smokeless tobacco: Never Used  Substance Use Topics   Alcohol use: No   Drug use: No     Allergies   Alendronate, Butazolidin [phenylbutazone], Codeine, Darvocet [propoxyphene n-acetaminophen], Ezetimibe-simvastatin, Meperidine, Morphine and related, Niacin, Oxybutynin chloride, Prednisone, Propoxyphene, Raloxifene, Risedronate, Simvastatin, Triamterene, Diltiazem hcl, Lansoprazole, Pravastatin, Sulfa antibiotics, and Teriparatide   Review of Systems Review of Systems  Unable to perform ROS: Mental status change     Physical Exam Updated Vital Signs BP (!) 170/94 (BP Location: Right Arm)    Pulse 70    Temp 98.9 F (37.2 C) (Oral)    Resp 20    SpO2 95%   Physical Exam Vitals signs and nursing note reviewed.  Constitutional:      General: She is not in acute distress.    Appearance: She is well-developed.     Comments: Resting comfortably in the bed in no acute distress  HENT:     Head: Normocephalic and atraumatic.  Eyes:     Extraocular Movements: Extraocular movements intact.     Conjunctiva/sclera: Conjunctivae normal.     Pupils: Pupils are equal, round, and reactive to light.  Neck:     Musculoskeletal: Normal range of motion and neck supple.  Cardiovascular:     Rate and Rhythm: Normal rate and regular rhythm.     Pulses: Normal pulses.  Pulmonary:     Effort: Pulmonary effort is normal. No respiratory distress.     Breath sounds: Normal breath sounds. No wheezing.  Abdominal:     Tenderness: There is guarding.     Comments: Patient denies tenderness, however guarding slightly with palpation of the abdomen.  ?pain vs due to dementia/confusion  Musculoskeletal: Normal range of motion.  Skin:    General: Skin is warm and  dry.     Capillary Refill: Capillary refill takes less than 2 seconds.  Neurological:     GCS: GCS eye subscore is 4. GCS verbal subscore is 4. GCS motor subscore is 6.     Comments: Patient is not oriented to self, place, event.  Answers are nonsensical.  Asking why I am wearing black and stating someone's a sophomore.       ED Treatments / Results  Labs (all labs ordered are listed, but only abnormal results are displayed) Labs Reviewed  URINALYSIS, ROUTINE W REFLEX MICROSCOPIC - Abnormal; Notable for the following components:      Result Value   APPearance CLOUDY (*)    Ketones, ur 5 (*)    Leukocytes,Ua LARGE (*)  WBC, UA >50 (*)    Bacteria, UA FEW (*)    All other components within normal limits  CBC WITH DIFFERENTIAL/PLATELET - Abnormal; Notable for the following components:   RBC 3.70 (*)    Hemoglobin 11.3 (*)    All other components within normal limits  COMPREHENSIVE METABOLIC PANEL - Abnormal; Notable for the following components:   Sodium 132 (*)    Chloride 97 (*)    Glucose, Bld 117 (*)    BUN 26 (*)    Creatinine, Ser 1.34 (*)    Calcium 8.7 (*)    GFR calc non Af Amer 33 (*)    GFR calc Af Amer 38 (*)    All other components within normal limits  URINE CULTURE  RAPID URINE DRUG SCREEN, HOSP PERFORMED  AMMONIA    EKG None  EKG Interpretation  Date/Time: 06/21/2019 2045   Ventricular Rate:  60  PR Interval:   158 QRS Duration:   QT Interval:   454 QTC Calculation:  454 R Axis:     Text Interpretation:  sinus arrhythmia. No stemi   Radiology Dg Chest 2 View  Result Date: 06/21/2019 CLINICAL DATA:  Altered mental status EXAM: CHEST - 2 VIEW COMPARISON:  April 15, 2016 FINDINGS: There is no edema or consolidation. There is cardiomegaly with pulmonary vascularity normal. No adenopathy. Bones are osteoporotic. There is degenerative change in the thoracic spine. There is evidence of an old healed fracture of the left clavicle. There is mild  leftward deviation of the upper thoracic trachea. IMPRESSION: Cardiomegaly. No edema or consolidation. Deviation of the upper thoracic trachea. Suspect right lobe thyroid enlargement. Electronically Signed   By: Bretta Bang III M.D.   On: 06/21/2019 20:02   Ct Head Wo Contrast  Result Date: 06/21/2019 CLINICAL DATA:  Altered mental status EXAM: CT HEAD WITHOUT CONTRAST TECHNIQUE: Contiguous axial images were obtained from the base of the skull through the vertex without intravenous contrast. COMPARISON:  CT brain 10/21/2013 FINDINGS: Brain: No acute territorial infarction, hemorrhage or new intracranial mass. Atrophy. Fairly extensive hypodensity in the subcortical, deep and periventricular white matter presumed small vessel ischemic change, progressed from prior CT. Stable slightly enlarged ventricles. Vascular: No hyperdense vessels.  Carotid vascular calcification Skull: Normal. Negative for fracture or focal lesion. Sinuses/Orbits: No acute finding. Other: None IMPRESSION: 1. No CT evidence for acute intracranial abnormality. 2. Atrophy and small vessel ischemic changes of the white matter Electronically Signed   By: Jasmine Pang M.D.   On: 06/21/2019 19:59    Procedures Procedures (including critical care time)  Medications Ordered in ED Medications  cephALEXin (KEFLEX) capsule 250 mg (has no administration in time range)     Initial Impression / Assessment and Plan / ED Course  I have reviewed the triage vital signs and the nursing notes.  Pertinent labs & imaging results that were available during my care of the patient were reviewed by me and considered in my medical decision making (see chart for details).        Patient presenting for evaluation of confusion and altered mental status.  Physical exam patient appears nontoxic.  However she does appear confused when compared to her baseline per facility staff.  Consider infection versus delirium versus metabolic derangement.   If patient remains altered, she will likely need to be admitted.  Will obtain labs, urine, CT head.   Labs at patient's baseline.  Creatinine slightly elevated 1.34, only slightly elevated from previous values of 1.0.  No leukocytosis.  Electrolytes stable.  Urine positive for infection with large leuks, greater than 50 white cells, and bacteria.  Urine culture pending.  Chest x-ray viewed and interpreted by me, no pneumonia, pneumothorax.  Per radiology read, upper tracheal deviation likely due to thyroid enlargement.  On exam, patient is without any difficulty breathing or airway difficulties. CT head without acute finding.   On reassessment, patient is more alert.  Alert to person and place.  No longer speaking nonsensically.  Case discussed with attending, Dr. Rhunette CroftNanavati evaluated the patient.  Patient's transient confusion likely due to UTI.  Will treat with antibiotics.  As she is at a facility, she can be treated orally with close monitoring.  If symptoms worsen, will be encouraged to return.  At this time, patient appears safe for discharge.  Return precautions given.  Patient states she understands.  Final Clinical Impressions(s) / ED Diagnoses   Final diagnoses:  Acute cystitis without hematuria  Transient confusion  Thyroid enlargement    ED Discharge Orders         Ordered    cephALEXin (KEFLEX) 250 MG capsule  4 times daily     06/21/19 2036           Alveria ApleyCaccavale, Cathleen Yagi, PA-C 06/21/19 2218    Derwood KaplanNanavati, Ankit, MD 06/23/19 1827    Alveria ApleyCaccavale, Nihal Doan, PA-C 07/01/19 [id number]    Derwood KaplanNanavati, Ankit, MD 07/01/19 2207

## 2019-06-21 NOTE — Discharge Instructions (Addendum)
Take antibiotics as prescribed.  Take entire course, even if your symptoms improve. Make sure you stay well-hydrated with water. Your x-ray today showed concerns for thyroid enlargement.  Follow-up with your primary care doctor for further evaluation. Return to the ER if she has worsened confusion, high fevers, persistent vomiting, no urination, or any new, worsening, or concerning symptoms.

## 2019-06-22 NOTE — ED Notes (Signed)
Pt unable to sign discharge paperwork. Report given to staff at [facility].

## 2019-06-24 LAB — URINE CULTURE: Culture: >=100000 — AB

## 2019-06-26 ENCOUNTER — Telehealth: Payer: Self-pay | Admitting: Emergency Medicine

## 2019-06-26 NOTE — Telephone Encounter (Signed)
Post ED Visit - Positive Culture Follow-up  Culture report reviewed by antimicrobial stewardship pharmacist: [place] Team []  Elenor Quinones, Pharm.D. []  Heide Guile, Pharm.D., BCPS AQ-ID []  Parks Neptune, Pharm.D., BCPS []  Alycia Rossetti, Pharm.D., BCPS []  [place], Pharm.D., BCPS, AAHIVP []  Legrand Como, Pharm.D., BCPS, AAHIVP []  Salome Arnt, PharmD, BCPS []  Johnnette Gourd, PharmD, BCPS []  Hughes Better, PharmD, BCPS [x]  Elicia Lamp, PharmD []  Laqueta Linden, PharmD, BCPS []  Albertina Parr, PharmD  [place] Team []  Leodis Sias, PharmD []  Lindell Spar, PharmD []  Royetta Asal, PharmD []  Graylin Shiver, Rph []  Rema Fendt) Glennon Mac, PharmD []  Arlyn Dunning, PharmD []  Netta Cedars, PharmD []  Dia Sitter, PharmD []  Leone Haven, PharmD []  Gretta Arab, PharmD []  Theodis Shove, PharmD []  Peggyann Juba, PharmD []  Reuel Boom, PharmD   Positive urine culture Treated with Cephalexin, organism sensitive to the same and no further patient follow-up is required at this time.  Sandi Raveling Mathhew Buysse 06/26/2019, 2:50 PM

## 2020-01-11 ENCOUNTER — Other Ambulatory Visit (HOSPITAL_COMMUNITY)
Admission: RE | Admit: 2020-01-11 | Discharge: 2020-01-11 | Disposition: A | Discharge status: Home / Self Care | Payer: Medicare PPO | Source: Other Acute Inpatient Hospital | Attending: Family Medicine | Admitting: Family Medicine

## 2020-01-11 DIAGNOSIS — N390 Urinary tract infection, site not specified: Secondary | ICD-10-CM | POA: Diagnosis present

## 2020-01-11 LAB — URINALYSIS, COMPLETE (UACMP) WITH MICROSCOPIC
Bilirubin Urine: NEGATIVE
Glucose, UA: NEGATIVE mg/dL
Hgb urine dipstick: NEGATIVE
Ketones, ur: NEGATIVE mg/dL
Nitrite: NEGATIVE
Protein, ur: NEGATIVE mg/dL
Specific Gravity, Urine: 1.012 (ref 1.005–1.030)
pH: 5 (ref 5.0–8.0)

## 2020-01-14 LAB — URINE CULTURE: Culture: >=100000 — AB

## 2020-03-06 ENCOUNTER — Other Ambulatory Visit (HOSPITAL_COMMUNITY)
Admission: RE | Admit: 2020-03-06 | Discharge: 2020-03-06 | Disposition: A | Discharge status: Home / Self Care | Payer: Medicare PPO | Source: Skilled Nursing Facility | Attending: Family Medicine | Admitting: Family Medicine

## 2020-03-06 DIAGNOSIS — N390 Urinary tract infection, site not specified: Secondary | ICD-10-CM | POA: Insufficient documentation

## 2020-03-06 LAB — URINALYSIS, ROUTINE W REFLEX MICROSCOPIC
Bilirubin Urine: NEGATIVE
Glucose, UA: NEGATIVE mg/dL
Hgb urine dipstick: NEGATIVE
Ketones, ur: NEGATIVE mg/dL
Nitrite: NEGATIVE
Protein, ur: NEGATIVE mg/dL
Specific Gravity, Urine: 1.009 (ref 1.005–1.030)
pH: 6 (ref 5.0–8.0)

## 2020-03-09 LAB — URINE CULTURE: Culture: >=100000 — AB

## 2020-04-16 ENCOUNTER — Encounter (HOSPITAL_COMMUNITY)
Admission: RE | Admit: 2020-04-16 | Discharge: 2020-04-16 | Disposition: A | Discharge status: Home / Self Care | Payer: Medicare PPO | Source: Skilled Nursing Facility | Attending: Family Medicine | Admitting: Family Medicine

## 2020-04-16 DIAGNOSIS — N390 Urinary tract infection, site not specified: Secondary | ICD-10-CM | POA: Diagnosis not present

## 2020-04-16 LAB — URINALYSIS, ROUTINE W REFLEX MICROSCOPIC
Bilirubin Urine: NEGATIVE
Glucose, UA: NEGATIVE mg/dL
Hgb urine dipstick: NEGATIVE
Ketones, ur: NEGATIVE mg/dL
Nitrite: POSITIVE — AB
Protein, ur: NEGATIVE mg/dL
Specific Gravity, Urine: 1.013 (ref 1.005–1.030)
WBC, UA: >50 WBC/hpf — ABNORMAL HIGH (ref 0–5)
pH: 5 (ref 5.0–8.0)

## 2020-04-18 LAB — URINE CULTURE: Culture: >=100000 — AB

## 2020-05-13 ENCOUNTER — Encounter (HOSPITAL_COMMUNITY): Payer: Self-pay | Admitting: Emergency Medicine

## 2020-05-13 ENCOUNTER — Other Ambulatory Visit: Payer: Self-pay

## 2020-05-13 ENCOUNTER — Emergency Department (HOSPITAL_COMMUNITY)
Admission: EM | Admit: 2020-05-13 | Discharge: 2020-05-14 | Disposition: A | Discharge status: Home / Self Care | Payer: Medicare PPO | Attending: Emergency Medicine | Admitting: Emergency Medicine

## 2020-05-13 DIAGNOSIS — I10 Essential (primary) hypertension: Secondary | ICD-10-CM | POA: Diagnosis not present

## 2020-05-13 DIAGNOSIS — R410 Disorientation, unspecified: Secondary | ICD-10-CM | POA: Diagnosis present

## 2020-05-13 DIAGNOSIS — F0390 Unspecified dementia without behavioral disturbance: Secondary | ICD-10-CM | POA: Diagnosis not present

## 2020-05-13 DIAGNOSIS — Z79899 Other long term (current) drug therapy: Secondary | ICD-10-CM | POA: Insufficient documentation

## 2020-05-13 DIAGNOSIS — N3000 Acute cystitis without hematuria: Secondary | ICD-10-CM

## 2020-05-13 LAB — CBC
HCT: 40.2 % (ref 36.0–46.0)
Hemoglobin: 12.8 g/dL (ref 12.0–15.0)
MCH: 30.7 pg (ref 26.0–34.0)
MCHC: 31.8 g/dL (ref 30.0–36.0)
MCV: 96.4 fL (ref 80.0–100.0)
Platelets: 233 10*3/uL (ref 150–400)
RBC: 4.17 MIL/uL (ref 3.87–5.11)
RDW: 14.8 % (ref 11.5–15.5)
WBC: 9.6 10*3/uL (ref 4.0–10.5)
nRBC: 0 % (ref 0.0–0.2)

## 2020-05-13 LAB — BASIC METABOLIC PANEL
Anion gap: 11 (ref 5–15)
BUN: 26 mg/dL — ABNORMAL HIGH (ref 8–23)
CO2: 29 mmol/L (ref 22–32)
Calcium: 9.2 mg/dL (ref 8.9–10.3)
Chloride: 95 mmol/L — ABNORMAL LOW (ref 98–111)
Creatinine, Ser: 0.98 mg/dL (ref 0.44–1.00)
GFR calc Af Amer: 55 mL/min — ABNORMAL LOW (ref >60)
GFR calc non Af Amer: 48 mL/min — ABNORMAL LOW (ref >60)
Glucose, Bld: 109 mg/dL — ABNORMAL HIGH (ref 70–99)
Potassium: 3.8 mmol/L (ref 3.5–5.1)
Sodium: 135 mmol/L (ref 135–145)

## 2020-05-13 NOTE — ED Triage Notes (Signed)
[facility] - pt brought for staff at SNF stating pt is "more altered than normal." EMS states that staff says pt stated she witnessed her husbands murder in the facility.

## 2020-05-13 NOTE — ED Provider Notes (Addendum)
[facility] EMERGENCY DEPARTMENT Provider Note   CSN: [id number] Arrival date & time: 05/13/20  2131     History Chief Complaint  Patient presents with  . Altered Mental Status    Felicia Wells is a 86 y.o. female.  HPI   Patient presents to the ED for evaluation of confusion.  According to the nursing home report the patient has been more confused than usual.  Patient has been stating that she saw her husband murdered.  Patient in the ED denies any complaints.  She states she feels fine.  Patient states she just needs to go back to her home so she can rest.  She denies any chest pain or headache.  No fevers or chills.  Past Medical History:  Diagnosis Date  . Anxiety   . Anxiety   . Eye problem   . GERD (gastroesophageal reflux disease)   . GI hemorrhage   . Glaucoma   . Hypertension   . Infections of kidney   . Osteoporosis     Patient Active Problem List   Diagnosis Date Noted  . Dementia with behavioral disturbance (HCC) 04/11/2016  . Lower GI bleed 04/09/2016  . Diverticulosis large intestine w/o perforation or abscess w/bleeding   . Palliative care encounter   . Hematochezia 09/12/2015  . DNR (do not resuscitate) 09/12/2015  . Bleeding gastrointestinal   . CAP (community acquired pneumonia) 12/04/2012  . Hyponatremia 12/04/2012  . Muscular deconditioning 12/04/2012  . Acute delirium 12/04/2012  . Fracture of acetabulum (HCC) 07/19/2012  . Essential hypertension 07/19/2012  . Meniere's disease 07/19/2012  . Muscle weakness (generalized) 06/03/2011  . Difficulty in walking(719.7) 06/03/2011    Past Surgical History:  Procedure Laterality Date  . brain shunt    . CATARACT EXTRACTION    . DILATION AND CURETTAGE OF UTERUS    . EYE SURGERY    . FOOT SURGERY    . HERNIA REPAIR    . THYROID SURGERY    . TONSILLECTOMY       OB History   No obstetric history on file.     History reviewed. No pertinent family history.  Social History   Tobacco  Use  . Smoking status: Never Smoker  . Smokeless tobacco: Never Used  Substance Use Topics  . Alcohol use: No  . Drug use: No    Home Medications Prior to Admission medications   Medication Sig Start Date End Date Taking? Authorizing Provider  cephALEXin (KEFLEX) 250 MG capsule Take 1 capsule (250 mg total) by mouth 2 (two) times daily for 7 days. 05/14/20 05/21/20  Linwood Dibbles, MD  cholecalciferol (VITAMIN D) 1000 UNITS tablet Take 500 Units by mouth 2 (two) times daily.     [provider]  famotidine (PEPCID) 20 MG tablet Take 1 tablet (20 mg total) by mouth 2 (two) times daily. Patient taking differently: Take 20 mg by mouth daily.  09/14/15   Avon Gully, MD  furosemide (LASIX) 20 MG tablet Take 20 mg by mouth daily.    [provider]  hydrALAZINE (APRESOLINE) 10 MG tablet Take 10 mg by mouth 2 (two) times daily.    [provider]  meclizine (ANTIVERT) 25 MG tablet Take 25 mg by mouth 3 (three) times daily as needed for dizziness.    [provider]  Multiple Vitamins-Iron (MULTIVITAMINS WITH IRON) TABS tablet Take 1 tablet by mouth daily.    [provider]  multivitamin-lutein (OCUVITE-LUTEIN) CAPS capsule Take 1 capsule by mouth  daily.    [provider]  Nebivolol HCl (BYSTOLIC) 20 MG TABS Take 20 mg by mouth 2 (two) times daily.    [provider]  omega-3 acid ethyl esters (LOVAZA) 1 g capsule Take 2 g by mouth daily.    [provider]  ondansetron (ZOFRAN) 4 MG tablet Take 4 mg by mouth 4 (four) times daily as needed for nausea or vomiting.  09/07/15   [provider]  potassium chloride (K-DUR) 10 MEQ tablet Take 1 tablet (10 mEq total) by mouth daily. 12/04/12   Kari Baars, MD  Propylene Glycol-Glycerin (SOOTHE) 0.6-0.6 % SOLN Place 1 drop into both eyes 4 (four) times daily.    [provider]  telmisartan (MICARDIS) 80 MG tablet Take 80 mg by mouth daily.    [provider]    timolol (BETIMOL) 0.5 % ophthalmic solution Place 1 drop into both eyes daily.    [provider]    Allergies    Alendronate, Butazolidin [phenylbutazone], Codeine, Darvocet [propoxyphene n-acetaminophen], Ezetimibe-simvastatin, Meperidine, Morphine and related, Niacin, Oxybutynin chloride, Prednisone, Propoxyphene, Raloxifene, Risedronate, Simvastatin, Triamterene, Diltiazem hcl, Lansoprazole, Pravastatin, Sulfa antibiotics, and Teriparatide  Review of Systems   Review of Systems  All other systems reviewed and are negative.   Physical Exam Updated Vital Signs Ht 1.626 m (5\' 4" )   Wt 59.9 kg   BMI 22.67 kg/m   Physical Exam Vitals and nursing note reviewed.  Constitutional:      General: She is not in acute distress.    Appearance: She is well-developed.  HENT:     Head: Normocephalic and atraumatic.     Right Ear: External ear normal.     Left Ear: External ear normal.  Eyes:     General: No scleral icterus.       Right eye: No discharge.        Left eye: No discharge.     Conjunctiva/sclera: Conjunctivae normal.  Neck:     Trachea: No tracheal deviation.  Cardiovascular:     Rate and Rhythm: Normal rate and regular rhythm.  Pulmonary:     Effort: Pulmonary effort is normal. No respiratory distress.     Breath sounds: Normal breath sounds. No stridor. No wheezing or rales.  Abdominal:     General: Bowel sounds are normal. There is no distension.     Palpations: Abdomen is soft.     Tenderness: There is no abdominal tenderness. There is no guarding or rebound.  Musculoskeletal:        General: No tenderness.     Cervical back: Neck supple.  Skin:    General: Skin is warm and dry.     Findings: No rash.  Neurological:     General: No focal deficit present.     Mental Status: She is alert.     Cranial Nerves: No cranial nerve deficit (no facial droop, extraocular movements intact, no slurred speech).     Sensory: No sensory deficit.     Motor: No  abnormal muscle tone or seizure activity.     Coordination: Coordination normal.     Comments: Patient is able to tell me her name.  She is aware she is in the emergency room equal grip strength bilaterally, patient is able to lift both legs without difficulty, no facial droop     ED Results / Procedures / Treatments   Labs (all labs ordered are listed, but only abnormal results are displayed) Labs Reviewed  BASIC METABOLIC PANEL -  Abnormal; Notable for the following components:      Result Value   Chloride 95 (*)    Glucose, Bld 109 (*)    BUN 26 (*)    GFR calc non Af Amer 48 (*)    GFR calc Af Amer 55 (*)    All other components within normal limits  URINALYSIS, ROUTINE W REFLEX MICROSCOPIC - Abnormal; Notable for the following components:   APPearance HAZY (*)    Nitrite POSITIVE (*)    Leukocytes,Ua TRACE (*)    WBC, UA >50 (*)    All other components within normal limits  CBC    EKG EKG Interpretation  Date/Time:  Sunday May 13 2020 21:43:09 EDT Ventricular Rate:  72 PR Interval:    QRS Duration: 99 QT Interval:  482 QTC Calculation: 417 R Axis:   -54 Text Interpretation: Sinus bradycardia Paired ventricular premature complexes , new since last tracing Probable left atrial enlargement Left anterior fascicular block Abnormal R-wave progression, early transition Left ventricular hypertrophy Confirmed by Adarrius Graeff ([id number]) on 05/13/2020 10:08:29 PM   Radiology No results found.  Procedures Procedures (including critical care time)  Medications Ordered in ED Medications  cephALEXin (KEFLEX) capsule 250 mg (has no administration in time range)    ED Course  I have reviewed the triage vital signs and the nursing notes.  Pertinent labs & imaging results that were available during my care of the patient were reviewed by me and considered in my medical decision making (see chart for details).    MDM Rules/Calculators/A&P                          Pt  presented to the ED for possible confusion.  In the ED does not appear to be confused.  Neuro exam normal.  CBC and BMET normal.  UA pending.  Plan on dc back to nursing facility after ua.   Final Clinical Impression(s) / ED Diagnoses Final diagnoses:  Dementia without behavioral disturbance, unspecified dementia type (HCC)  Acute cystitis without hematuria    Rx / DC Orders ED Discharge Orders         Ordered    cephALEXin (KEFLEX) 250 MG capsule  2 times daily        09 /13/21 0007           11-08-1982, MD 05/13/20 2357 UA suggest uti.   Will dc home on abx      07/13/20, MD 05/14/20 0009

## 2020-05-14 LAB — URINALYSIS, ROUTINE W REFLEX MICROSCOPIC
Bacteria, UA: NONE SEEN
Bilirubin Urine: NEGATIVE
Glucose, UA: NEGATIVE mg/dL
Hgb urine dipstick: NEGATIVE
Ketones, ur: NEGATIVE mg/dL
Nitrite: POSITIVE — AB
Protein, ur: NEGATIVE mg/dL
Specific Gravity, Urine: 1.01 (ref 1.005–1.030)
WBC, UA: >50 WBC/hpf — ABNORMAL HIGH (ref 0–5)
pH: 5 (ref 5.0–8.0)

## 2020-05-14 MED ORDER — CEPHALEXIN 250 MG PO CAPS: 250.0000 mg | ORAL_CAPSULE | Freq: Two times a day (BID) | ORAL | 0 refills | Status: AC

## 2020-05-14 MED ORDER — CEPHALEXIN 250 MG PO CAPS
250.0000 mg | ORAL_CAPSULE | Freq: Once | ORAL | Status: AC
  Administered 2020-05-14: 250 mg via ORAL
  Filled 2020-05-14 (×2): qty 1

## 2020-05-14 NOTE — Discharge Instructions (Addendum)
Take the antibiotics as prescribed, follow up with your doctor next week

## 2020-05-24 ENCOUNTER — Other Ambulatory Visit (HOSPITAL_COMMUNITY)
Admission: RE | Admit: 2020-05-24 | Discharge: 2020-05-24 | Disposition: A | Discharge status: Home / Self Care | Payer: Medicare PPO | Source: Other Acute Inpatient Hospital | Attending: Family Medicine | Admitting: Family Medicine

## 2020-05-24 DIAGNOSIS — R8290 Unspecified abnormal findings in urine: Secondary | ICD-10-CM | POA: Diagnosis present

## 2020-05-24 DIAGNOSIS — R410 Disorientation, unspecified: Secondary | ICD-10-CM | POA: Insufficient documentation

## 2020-05-24 LAB — URINALYSIS, ROUTINE W REFLEX MICROSCOPIC
Bilirubin Urine: NEGATIVE
Glucose, UA: NEGATIVE mg/dL
Ketones, ur: NEGATIVE mg/dL
Nitrite: NEGATIVE
Protein, ur: NEGATIVE mg/dL
Specific Gravity, Urine: 1.011 (ref 1.005–1.030)
WBC, UA: >50 WBC/hpf — ABNORMAL HIGH (ref 0–5)
pH: 6 (ref 5.0–8.0)

## 2020-05-27 LAB — URINE CULTURE: Culture: >=100000 — AB

## 2020-06-15 ENCOUNTER — Emergency Department (HOSPITAL_COMMUNITY): Payer: Medicare PPO

## 2020-06-15 ENCOUNTER — Inpatient Hospital Stay (HOSPITAL_COMMUNITY)
Admission: EM | Admit: 2020-06-15 | Discharge: 2020-06-22 | DRG: 690 | Disposition: A | Discharge status: Skilled Nursing Facility | Payer: Medicare PPO | Source: Skilled Nursing Facility | Attending: Internal Medicine | Admitting: Internal Medicine

## 2020-06-15 ENCOUNTER — Other Ambulatory Visit (HOSPITAL_COMMUNITY): Payer: Medicare PPO

## 2020-06-15 ENCOUNTER — Encounter (HOSPITAL_COMMUNITY): Payer: Self-pay | Admitting: Emergency Medicine

## 2020-06-15 ENCOUNTER — Other Ambulatory Visit: Payer: Self-pay

## 2020-06-15 DIAGNOSIS — F419 Anxiety disorder, unspecified: Secondary | ICD-10-CM | POA: Diagnosis present

## 2020-06-15 DIAGNOSIS — Z79899 Other long term (current) drug therapy: Secondary | ICD-10-CM

## 2020-06-15 DIAGNOSIS — I10 Essential (primary) hypertension: Secondary | ICD-10-CM | POA: Diagnosis present

## 2020-06-15 DIAGNOSIS — B9620 Unspecified Escherichia coli [E. coli] as the cause of diseases classified elsewhere: Secondary | ICD-10-CM | POA: Diagnosis present

## 2020-06-15 DIAGNOSIS — N179 Acute kidney failure, unspecified: Secondary | ICD-10-CM | POA: Diagnosis present

## 2020-06-15 DIAGNOSIS — E860 Dehydration: Secondary | ICD-10-CM | POA: Diagnosis present

## 2020-06-15 DIAGNOSIS — S51819A Laceration without foreign body of unspecified forearm, initial encounter: Secondary | ICD-10-CM

## 2020-06-15 DIAGNOSIS — F0390 Unspecified dementia without behavioral disturbance: Secondary | ICD-10-CM | POA: Diagnosis present

## 2020-06-15 DIAGNOSIS — S32592A Other specified fracture of left pubis, initial encounter for closed fracture: Secondary | ICD-10-CM

## 2020-06-15 DIAGNOSIS — R296 Repeated falls: Secondary | ICD-10-CM | POA: Diagnosis present

## 2020-06-15 DIAGNOSIS — M80852A Other osteoporosis with current pathological fracture, left femur, initial encounter for fracture: Secondary | ICD-10-CM | POA: Diagnosis present

## 2020-06-15 DIAGNOSIS — Z23 Encounter for immunization: Secondary | ICD-10-CM

## 2020-06-15 DIAGNOSIS — H409 Unspecified glaucoma: Secondary | ICD-10-CM | POA: Diagnosis present

## 2020-06-15 DIAGNOSIS — N390 Urinary tract infection, site not specified: Secondary | ICD-10-CM | POA: Diagnosis not present

## 2020-06-15 DIAGNOSIS — Y92129 Unspecified place in nursing home as the place of occurrence of the external cause: Secondary | ICD-10-CM

## 2020-06-15 DIAGNOSIS — Z66 Do not resuscitate: Secondary | ICD-10-CM | POA: Diagnosis present

## 2020-06-15 DIAGNOSIS — W19XXXA Unspecified fall, initial encounter: Secondary | ICD-10-CM

## 2020-06-15 DIAGNOSIS — S32592S Other specified fracture of left pubis, sequela: Secondary | ICD-10-CM | POA: Diagnosis present

## 2020-06-15 DIAGNOSIS — Z885 Allergy status to narcotic agent status: Secondary | ICD-10-CM

## 2020-06-15 DIAGNOSIS — Z882 Allergy status to sulfonamides status: Secondary | ICD-10-CM

## 2020-06-15 DIAGNOSIS — S51811A Laceration without foreign body of right forearm, initial encounter: Secondary | ICD-10-CM | POA: Diagnosis present

## 2020-06-15 DIAGNOSIS — Z888 Allergy status to other drugs, medicaments and biological substances status: Secondary | ICD-10-CM

## 2020-06-15 DIAGNOSIS — K219 Gastro-esophageal reflux disease without esophagitis: Secondary | ICD-10-CM | POA: Diagnosis present

## 2020-06-15 DIAGNOSIS — Z20822 Contact with and (suspected) exposure to covid-19: Secondary | ICD-10-CM | POA: Diagnosis present

## 2020-06-15 MED ORDER — FENTANYL CITRATE (PF) 100 MCG/2ML IJ SOLN
25.0000 ug | Freq: Once | INTRAMUSCULAR | Status: AC
  Administered 2020-06-16: 25 ug via INTRAVENOUS
  Filled 2020-06-15: qty 2

## 2020-06-15 MED ORDER — TETANUS-DIPHTH-ACELL PERTUSSIS 5-2.5-18.5 LF-MCG/0.5 IM SUSP
0.5000 mL | Freq: Once | INTRAMUSCULAR | Status: AC
  Administered 2020-06-16: 0.5 mL via INTRAMUSCULAR
  Filled 2020-06-15: qty 0.5

## 2020-06-15 NOTE — ED Provider Notes (Signed)
[facility] EMERGENCY DEPARTMENT Provider Note   CSN: [id number] Arrival date & time: 06/15/20  2119  LEVEL 5 CAVEAT - DEMENTIA  History Chief Complaint  Patient presents with  . Hip Pain    left    Felicia Wells is a 86 y.o. female.  HPI 86 year old female presents with difficulty walking and fall.  Per report, the patient fell last night or early this morning.  Staff thought she was okay but throughout the day she has had difficulty ambulating and seems to complain of left-sided hip pain.  Has a right sided skin tear on her forearm.  Further history is extremely limited given the patient's dementia.  Past Medical History:  Diagnosis Date  . Anxiety   . Anxiety   . Eye problem   . GERD (gastroesophageal reflux disease)   . GI hemorrhage   . Glaucoma   . Hypertension   . Infections of kidney   . Osteoporosis     Patient Active Problem List   Diagnosis Date Noted  . Closed fracture of multiple pubic rami, left, initial encounter (HCC) 06/15/2020  . Dementia with behavioral disturbance (HCC) 04/11/2016  . Lower GI bleed 04/09/2016  . Diverticulosis large intestine w/o perforation or abscess w/bleeding   . Palliative care encounter   . Hematochezia 09/12/2015  . DNR (do not resuscitate) 09/12/2015  . Bleeding gastrointestinal   . CAP (community acquired pneumonia) 12/04/2012  . Hyponatremia 12/04/2012  . Muscular deconditioning 12/04/2012  . Acute delirium 12/04/2012  . Fracture of acetabulum (HCC) 07/19/2012  . Essential hypertension 07/19/2012  . Meniere's disease 07/19/2012  . Muscle weakness (generalized) 06/03/2011  . Difficulty in walking(719.7) 06/03/2011    Past Surgical History:  Procedure Laterality Date  . brain shunt    . CATARACT EXTRACTION    . DILATION AND CURETTAGE OF UTERUS    . EYE SURGERY    . FOOT SURGERY    . HERNIA REPAIR    . THYROID SURGERY    . TONSILLECTOMY       OB History   No obstetric history on file.     History  reviewed. No pertinent family history.  Social History   Tobacco Use  . Smoking status: Never Smoker  . Smokeless tobacco: Never Used  Substance Use Topics  . Alcohol use: No  . Drug use: No    Home Medications Prior to Admission medications   Medication Sig Start Date End Date Taking? Authorizing Provider  cholecalciferol (VITAMIN D) 1000 UNITS tablet Take 500 Units by mouth 2 (two) times daily.     [provider]  famotidine (PEPCID) 20 MG tablet Take 1 tablet (20 mg total) by mouth 2 (two) times daily. Patient taking differently: Take 20 mg by mouth daily.  09/14/15   Avon Gully, MD  furosemide (LASIX) 20 MG tablet Take 20 mg by mouth daily.    [provider]  hydrALAZINE (APRESOLINE) 10 MG tablet Take 10 mg by mouth 2 (two) times daily.    [provider]  meclizine (ANTIVERT) 25 MG tablet Take 25 mg by mouth 3 (three) times daily as needed for dizziness.    [provider]  Multiple Vitamins-Iron (MULTIVITAMINS WITH IRON) TABS tablet Take 1 tablet by mouth daily.    [provider]  multivitamin-lutein (OCUVITE-LUTEIN) CAPS capsule Take 1 capsule by mouth daily.    [provider]  Nebivolol HCl (BYSTOLIC) 20 MG TABS Take 20 mg by mouth 2 (two) times daily.  [provider]  omega-3 acid ethyl esters (LOVAZA) 1 g capsule Take 2 g by mouth daily.    [provider]  ondansetron (ZOFRAN) 4 MG tablet Take 4 mg by mouth 4 (four) times daily as needed for nausea or vomiting.  09/07/15   [provider]  potassium chloride (K-DUR) 10 MEQ tablet Take 1 tablet (10 mEq total) by mouth daily. 12/04/12   Kari Baars, MD  Propylene Glycol-Glycerin (SOOTHE) 0.6-0.6 % SOLN Place 1 drop into both eyes 4 (four) times daily.    [provider]  telmisartan (MICARDIS) 80 MG tablet Take 80 mg by mouth daily.    [provider]  timolol (BETIMOL) 0.5 % ophthalmic solution Place 1 drop into both  eyes daily.    [provider]    Allergies    Alendronate, Butazolidin [phenylbutazone], Codeine, Darvocet [propoxyphene n-acetaminophen], Ezetimibe-simvastatin, Meperidine, Morphine and related, Niacin, Oxybutynin chloride, Prednisone, Propoxyphene, Raloxifene, Risedronate, Simvastatin, Triamterene, Diltiazem hcl, Lansoprazole, Pravastatin, Sulfa antibiotics, and Teriparatide  Review of Systems   Review of Systems  Unable to perform ROS: Dementia    Physical Exam Updated Vital Signs BP (!) 168/86   Pulse (!) 58   Temp 97.7 F (36.5 C) (Oral)   Resp (!) 25   Ht  (1.676 m)   Wt 72.6 kg   SpO2 92%   BMI 25.82 kg/m   Physical Exam Vitals and nursing note reviewed.  Constitutional:      Appearance: She is well-developed.  HENT:     Head: Normocephalic and atraumatic.     Right Ear: External ear normal.     Left Ear: External ear normal.     Nose: Nose normal.  Eyes:     General:        Right eye: No discharge.        Left eye: No discharge.  Cardiovascular:     Rate and Rhythm: Normal rate and regular rhythm.     Pulses:          Radial pulses are 2+ on the right side.       Dorsalis pedis pulses are 2+ on the right side and 2+ on the left side.  Pulmonary:     Effort: Pulmonary effort is normal.  Abdominal:     General: There is no distension.  Musculoskeletal:     Right elbow: Normal range of motion. No tenderness.     Right forearm: Laceration (skin tear) present. No deformity or tenderness.     Right hip: Tenderness present.     Left hip: Tenderness present.     Right knee: Normal range of motion. No tenderness.     Left knee: Normal range of motion. No tenderness.     Comments: Patient often shouts in pain, hard to localize. Hard to move patient without causing pain, and thus unclear if her back is actually tender or not Moving both hips causes pain  Skin:    General: Skin is warm and dry.  Neurological:     Mental Status: She is alert. She is  disoriented.  Psychiatric:        Mood and Affect: Mood is not anxious.     ED Results / Procedures / Treatments   Labs (all labs ordered are listed, but only abnormal results are displayed) Labs Reviewed  RESPIRATORY PANEL BY RT PCR (FLU A&B, COVID)  COMPREHENSIVE METABOLIC PANEL  CBC WITH DIFFERENTIAL/PLATELET    EKG EKG Interpretation  Date/Time:  Friday June 15 2020 21:28:11 EDT Ventricular Rate:  77 PR Interval:    QRS Duration: 99 QT Interval:  455 QTC Calculation: 419 R Axis:   -61 Text Interpretation: Sinus rhythm Paired ventricular premature complexes Abnormal R-wave progression, early transition Inferior infarct, old Consider anterior infarct Confirmed by Pricilla Loveless [phone]) on 06/15/2020 10:23:08 PM   Radiology DG Chest 1 View  Result Date: 06/15/2020 CLINICAL DATA:  Possible patient fall.  Unable to ambulate today. EXAM: CHEST  1 VIEW COMPARISON:  Chest x-ray 06/21/2019 FINDINGS: The heart size and mediastinal contours are unchanged with cardiomegaly. No focal consolidation. No pulmonary edema. No pleural effusion. No pneumothorax. No acute osseous abnormality. Multilevel degenerative changes of the spine. IMPRESSION: No acute cardiopulmonary disease. Electronically Signed   By: Tish Frederickson M.D.   On: 06/15/2020 23:56   DG Thoracic Spine 2 View  Result Date: 06/15/2020 CLINICAL DATA:  Recent fall with chest pain, initial encounter EXAM: THORACIC SPINE 2 VIEWS COMPARISON:  06/21/2019 FINDINGS: Vertebral body height is well maintained. Mild osteophytic changes are seen. No paraspinal mass lesion is noted. Pedicles are within normal limits. IMPRESSION: Mild degenerative change without acute abnormality. Electronically Signed   By: Alcide Clever M.D.   On: 06/15/2020 23:22   DG Lumbar Spine 2-3 Views  Result Date: 06/15/2020 CLINICAL DATA:  Recent fall with low back pain EXAM: LUMBAR SPINE - 2-3 VIEW COMPARISON:  04/21/2011 FINDINGS: Five lumbar type  vertebral bodies are well visualized. Vertebral body height is well maintained. Disc space narrowing is noted throughout the lumbar spine. Degenerative changes are seen with mild retrolisthesis of L4 on L5. No paraspinal soft tissue abnormality is noted. The known pubic rami fractures on the left are again seen. IMPRESSION: Pubic rami fractures on the left. Multilevel degenerative change in the lumbar spine without acute abnormality. Electronically Signed   By: Alcide Clever M.D.   On: 06/15/2020 23:25   DG Elbow Complete Right  Result Date: 06/15/2020 CLINICAL DATA:  Recent fall with right elbow pain, initial encounter EXAM: RIGHT ELBOW - COMPLETE 3+ VIEW COMPARISON:  None. FINDINGS: There is no evidence of fracture, dislocation, or joint effusion. There is no evidence of arthropathy or other focal bone abnormality. Soft tissues are unremarkable. IMPRESSION: No acute abnormality noted. Electronically Signed   By: Alcide Clever M.D.   On: 06/15/2020 23:21   CT Head Wo Contrast  Result Date: 06/15/2020 CLINICAL DATA:  unknown per staff if patient fell. Staff reports that patient has not been able to ambulate today as normal and is complaining of left hip pain and patient has an abrasion/ski EXAM: CT HEAD WITHOUT CONTRAST CT CERVICAL SPINE WITHOUT CONTRAST TECHNIQUE: Multidetector CT imaging of the head and cervical spine was performed following the standard protocol without intravenous contrast. Multiplanar CT image reconstructions of the cervical spine were also generated. COMPARISON:  CT C-spine 10/20/2013, CT head 06/21/2019. FINDINGS: CT HEAD FINDINGS Brain: Cerebral ventricle sizes are concordant with the degree of cerebral volume loss. Patchy and confluent areas of decreased attenuation are noted throughout the deep and periventricular white matter of the cerebral hemispheres bilaterally, compatible with chronic microvascular ischemic disease. Incidentally noted left mid cranial fossa arachnoid cyst.  Similar-appearing densely calcified pineal gland. No evidence of large-territorial acute infarction. No parenchymal hemorrhage. No mass lesion. No extra-axial collection. No mass effect or midline shift. No hydrocephalus. Basilar cisterns are patent. Vascular: No hyperdense vessel. Atherosclerotic calcifications are present within the cavernous internal carotid arteries. Skull: No acute fracture or focal lesion.  Sinuses/Orbits: Paranasal sinuses and mastoid air cells are clear. Bilateral lens replacement. Orbits are unremarkable. Other: None. CT CERVICAL SPINE FINDINGS Alignment: Reversal of the normal cervical lordosis likely due to positioning and degenerative changes. Interval development of grade 1 anterolisthesis of C2 on C3 with no findings to suggest traumatic etiology. No skipped facets, no associated fracture. Similar-appearing grade 1 anterolisthesis of C4 on C5. Similar-appearing grade 1 anterolisthesis of C7 on T1. Skull base and vertebrae: Multilevel severe degenerative changes of the spine. No acute fracture. No primary bone lesion or focal pathologic process. Soft tissues and spinal canal: No prevertebral fluid or swelling. No visible canal hematoma. Disc levels:  Maintained. Upper chest: Unremarkable. Other: None. IMPRESSION: 1. No acute intracranial abnormality. 2. No acute displaced fracture or traumatic listhesis of the cervical spine. Electronically Signed   By: Tish Frederickson M.D.   On: 06/15/2020 23:46   CT Cervical Spine Wo Contrast  Result Date: 06/15/2020 CLINICAL DATA:  unknown per staff if patient fell. Staff reports that patient has not been able to ambulate today as normal and is complaining of left hip pain and patient has an abrasion/ski EXAM: CT HEAD WITHOUT CONTRAST CT CERVICAL SPINE WITHOUT CONTRAST TECHNIQUE: Multidetector CT imaging of the head and cervical spine was performed following the standard protocol without intravenous contrast. Multiplanar CT image reconstructions  of the cervical spine were also generated. COMPARISON:  CT C-spine 10/20/2013, CT head 06/21/2019. FINDINGS: CT HEAD FINDINGS Brain: Cerebral ventricle sizes are concordant with the degree of cerebral volume loss. Patchy and confluent areas of decreased attenuation are noted throughout the deep and periventricular white matter of the cerebral hemispheres bilaterally, compatible with chronic microvascular ischemic disease. Incidentally noted left mid cranial fossa arachnoid cyst. Similar-appearing densely calcified pineal gland. No evidence of large-territorial acute infarction. No parenchymal hemorrhage. No mass lesion. No extra-axial collection. No mass effect or midline shift. No hydrocephalus. Basilar cisterns are patent. Vascular: No hyperdense vessel. Atherosclerotic calcifications are present within the cavernous internal carotid arteries. Skull: No acute fracture or focal lesion. Sinuses/Orbits: Paranasal sinuses and mastoid air cells are clear. Bilateral lens replacement. Orbits are unremarkable. Other: None. CT CERVICAL SPINE FINDINGS Alignment: Reversal of the normal cervical lordosis likely due to positioning and degenerative changes. Interval development of grade 1 anterolisthesis of C2 on C3 with no findings to suggest traumatic etiology. No skipped facets, no associated fracture. Similar-appearing grade 1 anterolisthesis of C4 on C5. Similar-appearing grade 1 anterolisthesis of C7 on T1. Skull base and vertebrae: Multilevel severe degenerative changes of the spine. No acute fracture. No primary bone lesion or focal pathologic process. Soft tissues and spinal canal: No prevertebral fluid or swelling. No visible canal hematoma. Disc levels:  Maintained. Upper chest: Unremarkable. Other: None. IMPRESSION: 1. No acute intracranial abnormality. 2. No acute displaced fracture or traumatic listhesis of the cervical spine. Electronically Signed   By: Tish Frederickson M.D.   On: 06/15/2020 23:46   DG HIPS  BILAT WITH PELVIS 3-4 VIEWS  Result Date: 06/15/2020 CLINICAL DATA:  Recent fall with hip pain, initial encounter EXAM: DG HIP (WITH OR WITHOUT PELVIS) 4V BILAT COMPARISON:  07/18/2012 FINDINGS: There are fractures involving the superior and inferior pubic rami on the left adjacent to the pubic symphysis. No definitive sacral fracture is seen. Degenerative changes of the hip joints are noted. No acute fracture is seen in the proximal femurs. IMPRESSION: No hip fracture identified. Fractures through the superior and inferior pubic rami on the left. Electronically Signed   By:  Alcide CleverMark  Lukens M.D.   On: 06/15/2020 23:20    Procedures .Marland Kitchen.Laceration Repair  Date/Time: 06/16/2020 12:10 AM Performed by: Pricilla LovelessGoldston, Amadi Frady, MD Authorized by: Pricilla LovelessGoldston, Shawntavia Saunders, MD   Anesthesia (see MAR for exact dosages):    Anesthesia method:  None Laceration details:    Location:  Shoulder/arm   Shoulder/arm location:  R lower arm   Length (cm):  8 Repair type:    Repair type:  Simple Skin repair:    Repair method:  Steri-Strips   Number of Steri-Strips:  5 Approximation:    Approximation:  Loose Post-procedure details:    Dressing:  Open (no dressing)   Patient tolerance of procedure:  Tolerated well, no immediate complications   (including critical care time)  Medications Ordered in ED Medications  fentaNYL (SUBLIMAZE) injection 25 mcg (has no administration in time range)  Tdap (BOOSTRIX) injection 0.5 mL (has no administration in time range)    ED Course  I have reviewed the triage vital signs and the nursing notes.  Pertinent labs & imaging results that were available during my care of the patient were reviewed by me and considered in my medical decision making (see chart for details).    MDM Rules/Calculators/A&P                          Patient's x-rays have been reviewed by myself and show left pubic rami fractures.  She is not having good pain control.  I discussed with her son, he would not  like any aggressive measures but would like for her to be admitted for pain control and hopefully be sent to a higher level of care than [facility].  I discussed with hospitalist for admission.  I have repaired her skin tear with Steri-Strips. Final Clinical Impression(s) / ED Diagnoses Final diagnoses:  Closed fracture of multiple pubic rami, left, initial encounter (HCC)  Skin tear of forearm without complication, initial encounter    Rx / DC Orders ED Discharge Orders    None       Pricilla LovelessGoldston, Yida Hyams, MD 06/16/20 0011

## 2020-06-15 NOTE — ED Triage Notes (Signed)
Patient brought in by EMS from [facility]. Unknown per staff if patient fell. Staff reports that patient has not been able to ambulate today as normal and is complaining of left hip pain and patient has an abrasion/skin tear to her right arm from hitting a lamp. Patient does have dementia.

## 2020-06-16 DIAGNOSIS — N390 Urinary tract infection, site not specified: Secondary | ICD-10-CM | POA: Diagnosis present

## 2020-06-16 DIAGNOSIS — E860 Dehydration: Secondary | ICD-10-CM | POA: Diagnosis not present

## 2020-06-16 DIAGNOSIS — B9620 Unspecified Escherichia coli [E. coli] as the cause of diseases classified elsewhere: Secondary | ICD-10-CM | POA: Diagnosis not present

## 2020-06-16 DIAGNOSIS — S32592A Other specified fracture of left pubis, initial encounter for closed fracture: Secondary | ICD-10-CM | POA: Diagnosis not present

## 2020-06-16 DIAGNOSIS — N179 Acute kidney failure, unspecified: Secondary | ICD-10-CM | POA: Diagnosis not present

## 2020-06-16 DIAGNOSIS — K219 Gastro-esophageal reflux disease without esophagitis: Secondary | ICD-10-CM | POA: Diagnosis not present

## 2020-06-16 DIAGNOSIS — Z66 Do not resuscitate: Secondary | ICD-10-CM | POA: Diagnosis not present

## 2020-06-16 DIAGNOSIS — H409 Unspecified glaucoma: Secondary | ICD-10-CM | POA: Diagnosis not present

## 2020-06-16 DIAGNOSIS — F419 Anxiety disorder, unspecified: Secondary | ICD-10-CM | POA: Diagnosis not present

## 2020-06-16 DIAGNOSIS — F0390 Unspecified dementia without behavioral disturbance: Secondary | ICD-10-CM | POA: Diagnosis not present

## 2020-06-16 DIAGNOSIS — I10 Essential (primary) hypertension: Secondary | ICD-10-CM | POA: Diagnosis not present

## 2020-06-16 DIAGNOSIS — Z23 Encounter for immunization: Secondary | ICD-10-CM | POA: Diagnosis not present

## 2020-06-16 DIAGNOSIS — R296 Repeated falls: Secondary | ICD-10-CM | POA: Diagnosis not present

## 2020-06-16 DIAGNOSIS — W19XXXA Unspecified fall, initial encounter: Secondary | ICD-10-CM | POA: Diagnosis not present

## 2020-06-16 DIAGNOSIS — Z79899 Other long term (current) drug therapy: Secondary | ICD-10-CM | POA: Diagnosis not present

## 2020-06-16 DIAGNOSIS — Z888 Allergy status to other drugs, medicaments and biological substances status: Secondary | ICD-10-CM | POA: Diagnosis not present

## 2020-06-16 DIAGNOSIS — Y92129 Unspecified place in nursing home as the place of occurrence of the external cause: Secondary | ICD-10-CM | POA: Diagnosis not present

## 2020-06-16 DIAGNOSIS — Z885 Allergy status to narcotic agent status: Secondary | ICD-10-CM | POA: Diagnosis not present

## 2020-06-16 DIAGNOSIS — S51811A Laceration without foreign body of right forearm, initial encounter: Secondary | ICD-10-CM | POA: Diagnosis not present

## 2020-06-16 DIAGNOSIS — M80852A Other osteoporosis with current pathological fracture, left femur, initial encounter for fracture: Secondary | ICD-10-CM | POA: Diagnosis not present

## 2020-06-16 DIAGNOSIS — Z882 Allergy status to sulfonamides status: Secondary | ICD-10-CM | POA: Diagnosis not present

## 2020-06-16 DIAGNOSIS — Z20822 Contact with and (suspected) exposure to covid-19: Secondary | ICD-10-CM | POA: Diagnosis not present

## 2020-06-16 LAB — URINALYSIS, ROUTINE W REFLEX MICROSCOPIC
Bilirubin Urine: NEGATIVE
Glucose, UA: NEGATIVE mg/dL
Hgb urine dipstick: NEGATIVE
Ketones, ur: NEGATIVE mg/dL
Nitrite: NEGATIVE
Protein, ur: 30 mg/dL — AB
Specific Gravity, Urine: 1.011 (ref 1.005–1.030)
WBC, UA: >50 WBC/hpf — ABNORMAL HIGH (ref 0–5)
pH: 7 (ref 5.0–8.0)

## 2020-06-16 LAB — COMPREHENSIVE METABOLIC PANEL
ALT: 21 U/L (ref 0–44)
AST: 30 U/L (ref 15–41)
Albumin: 3.9 g/dL (ref 3.5–5.0)
Alkaline Phosphatase: 81 U/L (ref 38–126)
Anion gap: 16 — ABNORMAL HIGH (ref 5–15)
BUN: 23 mg/dL (ref 8–23)
CO2: 29 mmol/L (ref 22–32)
Calcium: 9.6 mg/dL (ref 8.9–10.3)
Chloride: 95 mmol/L — ABNORMAL LOW (ref 98–111)
Creatinine, Ser: 1.06 mg/dL — ABNORMAL HIGH (ref 0.44–1.00)
GFR, Estimated: 43 mL/min — ABNORMAL LOW (ref >60)
Glucose, Bld: 146 mg/dL — ABNORMAL HIGH (ref 70–99)
Potassium: 3.6 mmol/L (ref 3.5–5.1)
Sodium: 140 mmol/L (ref 135–145)
Total Bilirubin: 0.9 mg/dL (ref 0.3–1.2)
Total Protein: 7.4 g/dL (ref 6.5–8.1)

## 2020-06-16 LAB — CBC WITH DIFFERENTIAL/PLATELET
Abs Immature Granulocytes: 0.18 10*3/uL — ABNORMAL HIGH (ref 0.00–0.07)
Basophils Absolute: 0.1 10*3/uL (ref 0.0–0.1)
Basophils Relative: 1 %
Eosinophils Absolute: 0 10*3/uL (ref 0.0–0.5)
Eosinophils Relative: 0 %
HCT: 38.8 % (ref 36.0–46.0)
Hemoglobin: 12.5 g/dL (ref 12.0–15.0)
Immature Granulocytes: 1 %
Lymphocytes Relative: 6 %
Lymphs Abs: 1 10*3/uL (ref 0.7–4.0)
MCH: 30.9 pg (ref 26.0–34.0)
MCHC: 32.2 g/dL (ref 30.0–36.0)
MCV: 96 fL (ref 80.0–100.0)
Monocytes Absolute: 0.9 10*3/uL (ref 0.1–1.0)
Monocytes Relative: 6 %
Neutro Abs: 13.7 10*3/uL — ABNORMAL HIGH (ref 1.7–7.7)
Neutrophils Relative %: 86 %
Platelets: 215 10*3/uL (ref 150–400)
RBC: 4.04 MIL/uL (ref 3.87–5.11)
RDW: 14.6 % (ref 11.5–15.5)
WBC: 16 10*3/uL — ABNORMAL HIGH (ref 4.0–10.5)
nRBC: 0 % (ref 0.0–0.2)

## 2020-06-16 LAB — RESPIRATORY PANEL BY RT PCR (FLU A&B, COVID)
Influenza A by PCR: NEGATIVE
Influenza B by PCR: NEGATIVE
SARS Coronavirus 2 by RT PCR: NEGATIVE

## 2020-06-16 MED ORDER — ACETAMINOPHEN 325 MG PO TABS: 650.0000 mg | ORAL_TABLET | Freq: Four times a day (QID) | ORAL | Status: DC | PRN

## 2020-06-16 MED ORDER — ONDANSETRON HCL 4 MG PO TABS: 4.0000 mg | ORAL_TABLET | Freq: Four times a day (QID) | ORAL | Status: DC | PRN

## 2020-06-16 MED ORDER — FAMOTIDINE 20 MG PO TABS
20.0000 mg | ORAL_TABLET | Freq: Every day | ORAL | Status: DC
  Administered 2020-06-19 – 2020-06-21 (×3): 20 mg via ORAL
  Filled 2020-06-16 (×4): qty 1

## 2020-06-16 MED ORDER — MORPHINE SULFATE (PF) 4 MG/ML IV SOLN
4.0000 mg | INTRAVENOUS | Status: DC | PRN
  Administered 2020-06-16: 4 mg via INTRAVENOUS
  Filled 2020-06-16: qty 1

## 2020-06-16 MED ORDER — IRBESARTAN 300 MG PO TABS
300.0000 mg | ORAL_TABLET | Freq: Every day | ORAL | Status: DC
  Filled 2020-06-16 (×2): qty 1
  Filled 2020-06-16: qty 2

## 2020-06-16 MED ORDER — HEPARIN SODIUM (PORCINE) 5000 UNIT/ML IJ SOLN
5000.0000 [IU] | Freq: Three times a day (TID) | INTRAMUSCULAR | Status: DC
  Administered 2020-06-16 – 2020-06-21 (×16): 5000 [IU] via SUBCUTANEOUS
  Filled 2020-06-16 (×16): qty 1

## 2020-06-16 MED ORDER — HYDRALAZINE HCL 10 MG PO TABS
10.0000 mg | ORAL_TABLET | Freq: Two times a day (BID) | ORAL | Status: DC
  Administered 2020-06-19 – 2020-06-21 (×6): 10 mg via ORAL
  Filled 2020-06-16 (×13): qty 1

## 2020-06-16 MED ORDER — TAB-A-VITE/IRON PO TABS
1.0000 | ORAL_TABLET | Freq: Every day | ORAL | Status: DC
  Administered 2020-06-19 – 2020-06-21 (×3): 1 via ORAL
  Filled 2020-06-16 (×6): qty 1

## 2020-06-16 MED ORDER — SODIUM CHLORIDE 0.9 % IV SOLN: INTRAVENOUS | Status: DC

## 2020-06-16 MED ORDER — VITAMIN D 25 MCG (1000 UNIT) PO TABS
500.0000 [IU] | ORAL_TABLET | Freq: Two times a day (BID) | ORAL | Status: DC
  Administered 2020-06-19 – 2020-06-21 (×6): 500 [IU] via ORAL
  Filled 2020-06-16 (×8): qty 1

## 2020-06-16 MED ORDER — MORPHINE SULFATE (PF) 2 MG/ML IV SOLN
1.0000 mg | INTRAVENOUS | Status: DC | PRN
  Administered 2020-06-16 – 2020-06-19 (×6): 1 mg via INTRAVENOUS
  Filled 2020-06-16 (×6): qty 1

## 2020-06-16 MED ORDER — FUROSEMIDE 20 MG PO TABS
20.0000 mg | ORAL_TABLET | Freq: Every day | ORAL | Status: DC
  Filled 2020-06-16: qty 1

## 2020-06-16 MED ORDER — OXYCODONE HCL 5 MG PO TABS: 5.0000 mg | ORAL_TABLET | ORAL | Status: DC | PRN

## 2020-06-16 MED ORDER — ACETAMINOPHEN 650 MG RE SUPP: 650.0000 mg | Freq: Four times a day (QID) | RECTAL | Status: DC | PRN

## 2020-06-16 MED ORDER — NEBIVOLOL HCL 10 MG PO TABS
10.0000 mg | ORAL_TABLET | Freq: Two times a day (BID) | ORAL | Status: DC
  Administered 2020-06-19 – 2020-06-21 (×6): 10 mg via ORAL
  Filled 2020-06-16 (×12): qty 1

## 2020-06-16 MED ORDER — TRAZODONE HCL 50 MG PO TABS: 25.0000 mg | ORAL_TABLET | Freq: Every evening | ORAL | Status: DC | PRN

## 2020-06-16 MED ORDER — ONDANSETRON HCL 4 MG/2ML IJ SOLN
4.0000 mg | Freq: Four times a day (QID) | INTRAMUSCULAR | Status: DC | PRN
  Administered 2020-06-16: 4 mg via INTRAVENOUS
  Filled 2020-06-16: qty 2

## 2020-06-16 NOTE — Progress Notes (Signed)
Subjective: Patient admitted this morning, see detailed H&P by Dr Netta Neat 86 year old female with history of osteoporosis, hypertension, glaucoma, GERD, anxiety came to ED with complaints of hip pain. Patient demented does not provide any history. Patient reportedly fell on 06/14/2020 night. Patient had ambulatory dysfunction after that. In the ED imaging studies showed pubic rami fracture on the left.  Vitals:   06/16/20 0946 06/16/20 1344  BP: 110/65 106/60  Pulse: (!) 59 63  Resp: 17 16  Temp: (!) 97.5 F (36.4 C) (!) 97.5 F (36.4 C)  SpO2: 98% 99%      A/P Left pubic rami fracture Hypertension Dementia  Continue Tylenol, oxycodone as needed morphine. PT evaluation We'll likely go back to facility once pain well controlled.    Meredeth Ide [facility] [phone]

## 2020-06-16 NOTE — Progress Notes (Signed)
Initial Nutrition Assessment  RD working remotely.  DOCUMENTATION CODES:   Not applicable  INTERVENTION:   - Liberalize diet to Regular  - Continue MVI daily  - Ensure Enlive po BID, each supplement provides 350 kcal and 20 grams of protein  NUTRITION DIAGNOSIS:   Increased nutrient needs related to hip fracture as evidenced by estimated needs.  GOAL:   Patient will meet greater than or equal to 90% of their needs  MONITOR:   PO intake, Supplement acceptance, Weight trends  REASON FOR ASSESSMENT:   Malnutrition Screening Tool    ASSESSMENT:   86 year old female who presented to the ED with hip pain. PMH of osteoporosis, HTN, glaucoma, GERD, anxiety, dementia. Pt found to have left pubic rami fracture.   Pt currently on a Heart Healthy diet with 0% meal completions recorded. Discussed diet liberalization with MD who agreed. Verbal with readback order placed for Regular diet.  Unable to reach pt via phone call to room. Pt with dementia and unable to provide history per notes.  Weight of 160 lbs (72.6 kg) on admission appears stated rather than measured. Pt's weight was recorded as 59.9 kg on 07/10/17, 07/21/17, and 05/13/20. It is difficult to determine pt's true weight at this time. RD will order oral nutrition supplements to aid pt in meeting kcal and protein needs during admission.  Medications reviewed and include: cholecalciferol, pepcid, lasix, MVI with iron  Labs reviewed.  NUTRITION - FOCUSED PHYSICAL EXAM:  Unable to complete at this time. RD working remotely.  Diet Order:   Diet Order            Diet regular Room service appropriate? Yes; Fluid consistency: Thin  Diet effective now                 EDUCATION NEEDS:   No education needs have been identified at this time  Skin:  Skin Assessment: Reviewed RN Assessment  Last BM:  no documented BM  Height:   Ht Readings from Last 1 Encounters:  06/15/20 5\' 6"  (1.676 m)    Weight:   Wt  Readings from Last 1 Encounters:  06/15/20 72.6 kg    BMI:  Body mass index is 25.82 kg/m.  Estimated Nutritional Needs:   Kcal:  1350-1550  Protein:  70-85 grams  Fluid:  1.4-1.6 L    06/17/20, MS, RD, LDN Inpatient Clinical Dietitian Please see [facility] for contact information.

## 2020-06-16 NOTE — Evaluation (Signed)
Physical Therapy Evaluation Patient Details Name: Felicia Wells MRN: [id number] DOB: [birth year] Today's Date: 06/16/2020   History of Present Illness  86 y.o. female, with history of osteoporosis, hypertension, glaucoma, GERD, anxiety, and more presents to the ED with a chief complaint of hip pain.  Patient is demented and is not able to provide any history.  Per report patient fell the night of 10/14 - 10/15.  Staff at [facility] but she was okay but then noticed that she was having ambulatory dysfunction today.  Staff reported that patient was complaining of left-sided hip pain.  She was transported to the ER for further evaluation of his hip pain.   X-ray of lumbar spine shows pubic rami fractures on the left, and multilevel degenerative change in the lumbar spine without acute abnormality. Ortho consult pending.   Clinical Impression  Pt admitted with above diagnosis.  Pt currently with functional limitations due to the deficits listed below (see PT Problem List). Pt will benefit from skilled PT to increase their independence and safety with mobility to allow discharge to the venue listed below. Recommend SNF, pt total assist today for bed mobility, however likely worsened cognition d/t meds.   Will continue to follow in acute setting.      Follow Up Recommendations SNF    Equipment Recommendations  None recommended by PT    Recommendations for Other Services       Precautions / Restrictions Precautions Precautions: Fall Restrictions Other Position/Activity Restrictions: ortho consult not yet done, no WBing orders currently      Mobility  Bed Mobility Overal bed mobility: Needs Assistance Bed Mobility: Sit to Supine;Supine to Sit;Rolling Rolling: Total assist   Supine to sit: Total assist;+2 for physical assistance;+2 for safety/equipment Sit to supine: Total assist;+2 for safety/equipment;+2 for physical assistance   General bed mobility comments: assist to roll for pad  change. pt requiring assist with trunk and LEs in both directions, unable to self assist however not resisting  Transfers                 General transfer comment: NT d/t pt decr ability to follow commands and ortho consult/WBing status  pending  Ambulation/Gait                Stairs            Wheelchair Mobility    Modified Rankin (Stroke Patients Only)       Balance Overall balance assessment: Needs assistance;History of Falls Sitting-balance support: Feet supported;No upper extremity supported Sitting balance-Leahy Scale: Poor Sitting balance - Comments: max to mod assist to maintain midline. Postural control: Posterior lean     Standing balance comment: NT                             Pertinent Vitals/Pain Pain Assessment: Faces Faces Pain Scale: No hurt Pain Intervention(s): Monitored during session;Repositioned    Home Living Family/patient expects to be discharged to:: Skilled nursing facility                      Prior Function           Comments: per chart pt was ambulatory at [facility]     Hand Dominance        Extremity/Trunk Assessment   Upper Extremity Assessment Upper Extremity Assessment: Generalized weakness;Difficult to assess due to impaired cognition    Lower Extremity Assessment Lower Extremity Assessment: Generalized  weakness;Difficult to assess due to impaired cognition (bil LEs P/AAROM grossly WFL)       Communication   Communication: Expressive difficulties (likely d/t meds, grossly nonverbal at time of PT eval)  Cognition Arousal/Alertness: Awake/alert Behavior During Therapy: Restless Overall Cognitive Status: History of cognitive impairments - at baseline                                 General Comments: pt nonverbal  but eyes open, possibly d/t painmeds; pt is bil mittens and constantly fidgeting      General Comments      Exercises     Assessment/Plan     PT Assessment Patient needs continued PT services  PT Problem List Decreased mobility;Decreased safety awareness;Decreased activity tolerance;Pain;Decreased knowledge of use of DME;Decreased balance;Decreased cognition       PT Treatment Interventions DME instruction;Therapeutic exercise;Functional mobility training;Therapeutic activities;Patient/family education;Gait training;Balance training    PT Goals (Current goals can be found in the Care Plan section)  Acute Rehab PT Goals Patient Stated Goal: unable to state PT Goal Formulation: Patient unable to participate in goal setting Time For Goal Achievement: 06/30/20 Potential to Achieve Goals: Fair    Frequency Min 3X/week   Barriers to discharge        Co-evaluation               AM-PAC PT "6 Clicks" Mobility  Outcome Measure Help needed turning from your back to your side while in a flat bed without using bedrails?: Total Help needed moving from lying on your back to sitting on the side of a flat bed without using bedrails?: Total Help needed moving to and from a bed to a chair (including a wheelchair)?: Total Help needed standing up from a chair using your arms (e.g., wheelchair or bedside chair)?: Total Help needed to walk in hospital room?: Total Help needed climbing 3-5 steps with a railing? : Total 6 Click Score: 6    End of Session   Activity Tolerance: Other (comment) (limited by cognition, pending ortho consult/no WBing orders) Patient left: in bed;with call bell/phone within reach;with restraints reapplied;with bed alarm set (mittens) Nurse Communication: Mobility status PT Visit Diagnosis: Other abnormalities of gait and mobility (R26.89);History of falling (Z91.81)    Time: 0488-8916 PT Time Calculation (min) (ACUTE ONLY): 16 min   Charges:   PT Evaluation $PT Eval Low Complexity: 1 Low          Kimara Bencomo, PT  Acute Rehab Dept ([facility]) [phone] Pager  [phone]  06/16/2020   [facility] 06/16/2020, 1:31 PM

## 2020-06-16 NOTE — H&P (Addendum)
TRH H&P    Patient Demographics:    Felicia Wells, is a 86 y.o. female  MRN: [id number]  DOB - 1918/12/06  Admit Date - 06/15/2020  Referring MD/NP/PA: Criss Alvine  Outpatient Primary MD for the patient is Kari Baars, MD  Patient coming from: [facility]  Chief complaint- hip pain   HPI:    Felicia Wells  is a 86 y.o. female, with history of osteoporosis, hypertension, glaucoma, GERD, anxiety, and more presents to the ED with a chief complaint of hip pain.  Patient is demented and is not able to provide any history.  Per report patient fell the night of 10/14 - 10/15.  Staff at [facility] but she was okay but then noticed that she was having ambulatory dysfunction today.  Staff reported that patient was complaining of left-sided hip pain.  She was transported to the ER for further evaluation of his hip pain.  At the time of my exam patient says that she is not in any pain at that moment, but she is not able to provide any history about anything prior to my exam.  Patient is pleasantly demented.  In the ED Temperature 97.7, heart rate 58-1 04, respiratory rate 25, blood pressure 160/86, satting at 92% 25 mcg of fentanyl controlled her pain Chemistry panel shows a hypochloremia at 95, creatinine at her baseline 1.06, anion gap mildly elevated at 16 Hematology reveals a leukocytosis at 16, hemoglobin 12.5 Imaging: X-ray of right elbow shows no acute abnormality -this study was done because there is a skin tear on the right arm X-ray of lumbar spine shows pubic rami fractures on the left, and multilevel degenerative change in the lumbar spine without acute abnormality X-ray thoracic spine shows mild degenerative change without acute abnormality Chest x-ray shows no acute cardiopulmonary disease, no acute osseous abnormality CT head without contrast and CT cervical spine -no acute intracranial abnormality, no  acute displaced fracture or traumatic listhesis of the cervical spine Tdap given in ER   Review of systems:    Review of Systems  Unable to perform ROS: Dementia      Past History of the following :    Past Medical History:  Diagnosis Date   Anxiety    Anxiety    Eye problem    GERD (gastroesophageal reflux disease)    GI hemorrhage    Glaucoma    Hypertension    Infections of kidney    Osteoporosis       Past Surgical History:  Procedure Laterality Date   brain shunt     CATARACT EXTRACTION     DILATION AND CURETTAGE OF UTERUS     EYE SURGERY     FOOT SURGERY     HERNIA REPAIR     THYROID SURGERY     TONSILLECTOMY        Social History:      Social History   Tobacco Use   Smoking status: Never Smoker   Smokeless tobacco: Never Used  Substance Use Topics   Alcohol use: No  Family History :    History reviewed. No pertinent family history.    Home Medications:   Prior to Admission medications   Medication Sig Start Date End Date Taking? Authorizing Provider  cholecalciferol (VITAMIN D) 1000 UNITS tablet Take 500 Units by mouth 2 (two) times daily.     [provider]  famotidine (PEPCID) 20 MG tablet Take 1 tablet (20 mg total) by mouth 2 (two) times daily. Patient taking differently: Take 20 mg by mouth daily.  09/14/15   Avon Gully, MD  furosemide (LASIX) 20 MG tablet Take 20 mg by mouth daily.    [provider]  hydrALAZINE (APRESOLINE) 10 MG tablet Take 10 mg by mouth 2 (two) times daily.    [provider]  meclizine (ANTIVERT) 25 MG tablet Take 25 mg by mouth 3 (three) times daily as needed for dizziness.    [provider]  Multiple Vitamins-Iron (MULTIVITAMINS WITH IRON) TABS tablet Take 1 tablet by mouth daily.    [provider]  multivitamin-lutein (OCUVITE-LUTEIN) CAPS capsule Take 1 capsule by mouth daily.    [provider]  Nebivolol HCl (BYSTOLIC) 20  MG TABS Take 20 mg by mouth 2 (two) times daily.    [provider]  omega-3 acid ethyl esters (LOVAZA) 1 g capsule Take 2 g by mouth daily.    [provider]  ondansetron (ZOFRAN) 4 MG tablet Take 4 mg by mouth 4 (four) times daily as needed for nausea or vomiting.  09/07/15   [provider]  potassium chloride (K-DUR) 10 MEQ tablet Take 1 tablet (10 mEq total) by mouth daily. 12/04/12   Kari Baars, MD  Propylene Glycol-Glycerin (SOOTHE) 0.6-0.6 % SOLN Place 1 drop into both eyes 4 (four) times daily.    [provider]  telmisartan (MICARDIS) 80 MG tablet Take 80 mg by mouth daily.    [provider]  timolol (BETIMOL) 0.5 % ophthalmic solution Place 1 drop into both eyes daily.    [provider]     Allergies:     Allergies  Allergen Reactions   Alendronate Other (See Comments)    Alendronic Acid Per MAR Reaction:  Cramping    Butazolidin [Phenylbutazone] Swelling   Codeine Nausea And Vomiting   Darvocet [Propoxyphene N-Acetaminophen] Nausea And Vomiting   Ezetimibe-Simvastatin Other (See Comments)    Reaction:  Numbness    Meperidine Nausea And Vomiting   Morphine And Related Other (See Comments)    Reaction:  Unknown    Niacin Nausea And Vomiting and Other (See Comments)    Reaction:  Bloating    Oxybutynin Chloride Other (See Comments)    Reaction:  Confusion    Prednisone Other (See Comments)    Reaction:  Confusion    Propoxyphene Nausea And Vomiting   Raloxifene Other (See Comments)    Reaction:  Cramping    Risedronate Other (See Comments)    Reaction:  Dizziness    Simvastatin Other (See Comments)    Reaction:  Bloating    Triamterene Nausea And Vomiting   Diltiazem Hcl Rash   Lansoprazole Rash   Pravastatin Rash   Sulfa Antibiotics Rash   Teriparatide Palpitations     Physical Exam:   Vitals  Blood pressure (!) 181/93, pulse 70, temperature 97.7 F (36.5 C), temperature source  Oral, resp. rate (!) 25, height  (1.676 m), weight 72.6 kg, SpO2 94 %.  1.  General: Patient lying supine in bed, pleasantly muttering to self  2. Psychiatric: Alert, disoriented to place time and context, but does know self  3. Neurologic: Cranial nerves II through XII are grossly intact, moves both upper extremities voluntarily, will not move lower extremities is hard to discern if this is just due to pain which it likely is  4. HEENMT:  Head is atraumatic, normocephalic, pupils reactive to light, neck is cachectic, mucous membranes are moist, trachea is midline  5. Respiratory : Diminished breath sounds in the lower lung fields bilaterally, no wheezes rhonchi or crackles  6. Cardiovascular : Heart rate is normal, rhythm is regular, subtle systolic murmur, no rubs or gallops  7. Gastrointestinal:  Abdomen is soft, nondistended, nontender to palpation  8. Skin:  Multiple lentigines and actinic keratoses, no acute changes  9.Musculoskeletal:  Venous stasis changes of the lower extremities, peripheral pulses palpated, no acute deformity    Data Review:    CBC Recent Labs  Lab 06/16/20 0002  WBC 16.0*  HGB 12.5  HCT 38.8  PLT 215  MCV 96.0  MCH 30.9  MCHC 32.2  RDW 14.6  LYMPHSABS 1.0  MONOABS 0.9  EOSABS 0.0  BASOSABS 0.1   ------------------------------------------------------------------------------------------------------------------  Results for orders placed or performed during the hospital encounter of 06/15/20 (from the past 48 hour(s))  Comprehensive metabolic panel     Status: Abnormal   Collection Time: 06/16/20 12:02 AM  Result Value Ref Range   Sodium 140 135 - 145 mmol/L   Potassium 3.6 3.5 - 5.1 mmol/L   Chloride 95 (L) 98 - 111 mmol/L   CO2 29 22 - 32 mmol/L   Glucose, Bld 146 (H) 70 - 99 mg/dL    Comment: Glucose reference range applies only to samples taken after fasting for at least 8 hours.   BUN 23 8 - 23 mg/dL   Creatinine, Ser  6.96 (H) 0.44 - 1.00 mg/dL   Calcium 9.6 8.9 - 78.9 mg/dL   Total Protein 7.4 6.5 - 8.1 g/dL   Albumin 3.9 3.5 - 5.0 g/dL   AST 30 15 - 41 U/L   ALT 21 0 - 44 U/L   Alkaline Phosphatase 81 38 - 126 U/L   Total Bilirubin 0.9 0.3 - 1.2 mg/dL   GFR, Estimated 43 (L) >60 mL/min   Anion gap 16 (H) 5 - 15    Comment: Performed at [facility], [address] [place], [place] [address]  CBC with Differential     Status: Abnormal   Collection Time: 06/16/20 12:02 AM  Result Value Ref Range   WBC 16.0 (H) 4.0 - 10.5 K/uL   RBC 4.04 3.87 - 5.11 MIL/uL   Hemoglobin 12.5 12.0 - 15.0 g/dL   HCT 75.1 36 - 46 %   MCV 96.0 80.0 - 100.0 fL   MCH 30.9 26.0 - 34.0 pg   MCHC 32.2 30.0 - 36.0 g/dL   RDW 02.5 85.2 - 77.8 %   Platelets 215 150 - 400 K/uL   nRBC 0.0 0.0 - 0.2 %   Neutrophils Relative % 86 %   Neutro Abs 13.7 (H) 1.7 - 7.7 K/uL   Lymphocytes Relative 6 %   Lymphs Abs 1.0 0.7 - 4.0 K/uL   Monocytes Relative 6 %   Monocytes Absolute 0.9 0.1 - 1.0 K/uL   Eosinophils Relative 0 %   Eosinophils Absolute 0.0 0.0 - 0.5 K/uL   Basophils Relative 1 %   Basophils Absolute 0.1 0.0 - 0.1 K/uL   Immature Granulocytes 1 %  Abs Immature Granulocytes 0.18 (H) 0.00 - 0.07 K/uL    Comment: Performed at [facility], [address], [place], [place] [address]  Respiratory Panel by RT PCR (Flu A&B, Covid) - Nasopharyngeal Swab     Status: None   Collection Time: 06/16/20 12:15 AM   Specimen: Nasopharyngeal Swab  Result Value Ref Range   SARS Coronavirus 2 by RT PCR NEGATIVE NEGATIVE    Comment: (NOTE) SARS-CoV-2 target nucleic acids are NOT DETECTED.  The SARS-CoV-2 RNA is generally detectable in upper respiratoy specimens during the acute phase of infection. The lowest concentration of SARS-CoV-2 viral copies this assay can detect is 131 copies/mL. A negative result does not preclude SARS-Cov-2 infection and should not be used as the sole basis for treatment or other patient management  decisions. A negative result may occur with  improper specimen collection/handling, submission of specimen other than nasopharyngeal swab, presence of viral mutation(s) within the areas targeted by this assay, and inadequate number of viral copies (<131 copies/mL). A negative result must be combined with clinical observations, patient history, and epidemiological information. The expected result is Negative.  Fact Sheet for Patients:  https://www.moore.com/  Fact Sheet for Healthcare Providers:  https://www.young.biz/  This test is no t yet approved or cleared by the [place] FDA and  has been authorized for detection and/or diagnosis of SARS-CoV-2 by FDA under an Emergency Use Authorization (EUA). This EUA will remain  in effect (meaning this test can be used) for the duration of the COVID-19 declaration under Section 564(b)(1) of the Act, 21 U.S.C. section 360bbb-3(b)(1), unless the authorization is terminated or revoked sooner.     Influenza A by PCR NEGATIVE NEGATIVE   Influenza B by PCR NEGATIVE NEGATIVE    Comment: (NOTE) The Xpert Xpress SARS-CoV-2/FLU/RSV assay is intended as an aid in  the diagnosis of influenza from Nasopharyngeal swab specimens and  should not be used as a sole basis for treatment. Nasal washings and  aspirates are unacceptable for Xpert Xpress SARS-CoV-2/FLU/RSV  testing.  Fact Sheet for Patients: https://www.moore.com/  Fact Sheet for Healthcare Providers: https://www.young.biz/  This test is not yet approved or cleared by the [place] FDA and  has been authorized for detection and/or diagnosis of SARS-CoV-2 by  FDA under an Emergency Use Authorization (EUA). This EUA will remain  in effect (meaning this test can be used) for the duration of the  Covid-19 declaration under Section 564(b)(1) of the Act, 21  U.S.C. section 360bbb-3(b)(1), unless the  authorization is  terminated or revoked. Performed at [facility], [address] [place], [place] [address]     Chemistries  Recent Labs  Lab 06/16/20 0002  NA 140  K 3.6  CL 95*  CO2 29  GLUCOSE 146*  BUN 23  CREATININE 1.06*  CALCIUM 9.6  AST 30  ALT 21  ALKPHOS 81  BILITOT 0.9   ------------------------------------------------------------------------------------------------------------------  ------------------------------------------------------------------------------------------------------------------ GFR: Estimated Creatinine Clearance: 29.5 mL/min (A) (by C-G formula based on SCr of 1.06 mg/dL (H)). Liver Function Tests: Recent Labs  Lab 06/16/20 0002  AST 30  ALT 21  ALKPHOS 81  BILITOT 0.9  PROT 7.4  ALBUMIN 3.9   No results for input(s): LIPASE, AMYLASE in the last 168 hours. No results for input(s): AMMONIA in the last 168 hours. Coagulation Profile: No results for input(s): INR, PROTIME in the last 168 hours. Cardiac Enzymes: No results for input(s): CKTOTAL, CKMB, CKMBINDEX, TROPONINI in the last 168 hours. BNP (last 3 results) No results  for input(s): PROBNP in the last 8760 hours. HbA1C: No results for input(s): HGBA1C in the last 72 hours. CBG: No results for input(s): GLUCAP in the last 168 hours. Lipid Profile: No results for input(s): CHOL, HDL, LDLCALC, TRIG, CHOLHDL, LDLDIRECT in the last 72 hours. Thyroid Function Tests: No results for input(s): TSH, T4TOTAL, FREET4, T3FREE, THYROIDAB in the last 72 hours. Anemia Panel: No results for input(s): VITAMINB12, FOLATE, FERRITIN, TIBC, IRON, RETICCTPCT in the last 72 hours.  --------------------------------------------------------------------------------------------------------------- Urine analysis:    Component Value Date/Time   COLORURINE AMBER (A) 05/24/2020 1200   APPEARANCEUR CLOUDY (A) 05/24/2020 1200   LABSPEC 1.011 05/24/2020 1200   PHURINE 6.0 05/24/2020 1200   GLUCOSEU  NEGATIVE 05/24/2020 1200   HGBUR SMALL (A) 05/24/2020 1200   BILIRUBINUR NEGATIVE 05/24/2020 1200   KETONESUR NEGATIVE 05/24/2020 1200   PROTEINUR NEGATIVE 05/24/2020 1200   UROBILINOGEN 0.2 07/14/2013 1910   NITRITE NEGATIVE 05/24/2020 1200   LEUKOCYTESUR LARGE (A) 05/24/2020 1200      Imaging Results:    DG Chest 1 View  Result Date: 06/15/2020 CLINICAL DATA:  Possible patient fall.  Unable to ambulate today. EXAM: CHEST  1 VIEW COMPARISON:  Chest x-ray 06/21/2019 FINDINGS: The heart size and mediastinal contours are unchanged with cardiomegaly. No focal consolidation. No pulmonary edema. No pleural effusion. No pneumothorax. No acute osseous abnormality. Multilevel degenerative changes of the spine. IMPRESSION: No acute cardiopulmonary disease. Electronically Signed   By: Tish Frederickson M.D.   On: 06/15/2020 23:56   DG Thoracic Spine 2 View  Result Date: 06/15/2020 CLINICAL DATA:  Recent fall with chest pain, initial encounter EXAM: THORACIC SPINE 2 VIEWS COMPARISON:  06/21/2019 FINDINGS: Vertebral body height is well maintained. Mild osteophytic changes are seen. No paraspinal mass lesion is noted. Pedicles are within normal limits. IMPRESSION: Mild degenerative change without acute abnormality. Electronically Signed   By: Alcide Clever M.D.   On: 06/15/2020 23:22   DG Lumbar Spine 2-3 Views  Result Date: 06/15/2020 CLINICAL DATA:  Recent fall with low back pain EXAM: LUMBAR SPINE - 2-3 VIEW COMPARISON:  04/21/2011 FINDINGS: Five lumbar type vertebral bodies are well visualized. Vertebral body height is well maintained. Disc space narrowing is noted throughout the lumbar spine. Degenerative changes are seen with mild retrolisthesis of L4 on L5. No paraspinal soft tissue abnormality is noted. The known pubic rami fractures on the left are again seen. IMPRESSION: Pubic rami fractures on the left. Multilevel degenerative change in the lumbar spine without acute abnormality. Electronically  Signed   By: Alcide Clever M.D.   On: 06/15/2020 23:25   DG Elbow Complete Right  Result Date: 06/15/2020 CLINICAL DATA:  Recent fall with right elbow pain, initial encounter EXAM: RIGHT ELBOW - COMPLETE 3+ VIEW COMPARISON:  None. FINDINGS: There is no evidence of fracture, dislocation, or joint effusion. There is no evidence of arthropathy or other focal bone abnormality. Soft tissues are unremarkable. IMPRESSION: No acute abnormality noted. Electronically Signed   By: Alcide Clever M.D.   On: 06/15/2020 23:21   CT Head Wo Contrast  Result Date: 06/15/2020 CLINICAL DATA:  unknown per staff if patient fell. Staff reports that patient has not been able to ambulate today as normal and is complaining of left hip pain and patient has an abrasion/ski EXAM: CT HEAD WITHOUT CONTRAST CT CERVICAL SPINE WITHOUT CONTRAST TECHNIQUE: Multidetector CT imaging of the head and cervical spine was performed following the standard protocol without intravenous contrast. Multiplanar CT image reconstructions of the  cervical spine were also generated. COMPARISON:  CT C-spine 10/20/2013, CT head 06/21/2019. FINDINGS: CT HEAD FINDINGS Brain: Cerebral ventricle sizes are concordant with the degree of cerebral volume loss. Patchy and confluent areas of decreased attenuation are noted throughout the deep and periventricular white matter of the cerebral hemispheres bilaterally, compatible with chronic microvascular ischemic disease. Incidentally noted left mid cranial fossa arachnoid cyst. Similar-appearing densely calcified pineal gland. No evidence of large-territorial acute infarction. No parenchymal hemorrhage. No mass lesion. No extra-axial collection. No mass effect or midline shift. No hydrocephalus. Basilar cisterns are patent. Vascular: No hyperdense vessel. Atherosclerotic calcifications are present within the cavernous internal carotid arteries. Skull: No acute fracture or focal lesion. Sinuses/Orbits: Paranasal sinuses and  mastoid air cells are clear. Bilateral lens replacement. Orbits are unremarkable. Other: None. CT CERVICAL SPINE FINDINGS Alignment: Reversal of the normal cervical lordosis likely due to positioning and degenerative changes. Interval development of grade 1 anterolisthesis of C2 on C3 with no findings to suggest traumatic etiology. No skipped facets, no associated fracture. Similar-appearing grade 1 anterolisthesis of C4 on C5. Similar-appearing grade 1 anterolisthesis of C7 on T1. Skull base and vertebrae: Multilevel severe degenerative changes of the spine. No acute fracture. No primary bone lesion or focal pathologic process. Soft tissues and spinal canal: No prevertebral fluid or swelling. No visible canal hematoma. Disc levels:  Maintained. Upper chest: Unremarkable. Other: None. IMPRESSION: 1. No acute intracranial abnormality. 2. No acute displaced fracture or traumatic listhesis of the cervical spine. Electronically Signed   By: Tish FredericksonMorgane  Naveau M.D.   On: 06/15/2020 23:46   CT Cervical Spine Wo Contrast  Result Date: 06/15/2020 CLINICAL DATA:  unknown per staff if patient fell. Staff reports that patient has not been able to ambulate today as normal and is complaining of left hip pain and patient has an abrasion/ski EXAM: CT HEAD WITHOUT CONTRAST CT CERVICAL SPINE WITHOUT CONTRAST TECHNIQUE: Multidetector CT imaging of the head and cervical spine was performed following the standard protocol without intravenous contrast. Multiplanar CT image reconstructions of the cervical spine were also generated. COMPARISON:  CT C-spine 10/20/2013, CT head 06/21/2019. FINDINGS: CT HEAD FINDINGS Brain: Cerebral ventricle sizes are concordant with the degree of cerebral volume loss. Patchy and confluent areas of decreased attenuation are noted throughout the deep and periventricular white matter of the cerebral hemispheres bilaterally, compatible with chronic microvascular ischemic disease. Incidentally noted left mid  cranial fossa arachnoid cyst. Similar-appearing densely calcified pineal gland. No evidence of large-territorial acute infarction. No parenchymal hemorrhage. No mass lesion. No extra-axial collection. No mass effect or midline shift. No hydrocephalus. Basilar cisterns are patent. Vascular: No hyperdense vessel. Atherosclerotic calcifications are present within the cavernous internal carotid arteries. Skull: No acute fracture or focal lesion. Sinuses/Orbits: Paranasal sinuses and mastoid air cells are clear. Bilateral lens replacement. Orbits are unremarkable. Other: None. CT CERVICAL SPINE FINDINGS Alignment: Reversal of the normal cervical lordosis likely due to positioning and degenerative changes. Interval development of grade 1 anterolisthesis of C2 on C3 with no findings to suggest traumatic etiology. No skipped facets, no associated fracture. Similar-appearing grade 1 anterolisthesis of C4 on C5. Similar-appearing grade 1 anterolisthesis of C7 on T1. Skull base and vertebrae: Multilevel severe degenerative changes of the spine. No acute fracture. No primary bone lesion or focal pathologic process. Soft tissues and spinal canal: No prevertebral fluid or swelling. No visible canal hematoma. Disc levels:  Maintained. Upper chest: Unremarkable. Other: None. IMPRESSION: 1. No acute intracranial abnormality. 2. No acute displaced fracture or  traumatic listhesis of the cervical spine. Electronically Signed   By: Tish Frederickson M.D.   On: 06/15/2020 23:46   DG HIPS BILAT WITH PELVIS 3-4 VIEWS  Result Date: 06/15/2020 CLINICAL DATA:  Recent fall with hip pain, initial encounter EXAM: DG HIP (WITH OR WITHOUT PELVIS) 4V BILAT COMPARISON:  07/18/2012 FINDINGS: There are fractures involving the superior and inferior pubic rami on the left adjacent to the pubic symphysis. No definitive sacral fracture is seen. Degenerative changes of the hip joints are noted. No acute fracture is seen in the proximal femurs.  IMPRESSION: No hip fracture identified. Fractures through the superior and inferior pubic rami on the left. Electronically Signed   By: Alcide Clever M.D.   On: 06/15/2020 23:20    My personal review of EKG: Rhythm NSR with PVC, Rate 77 /min, QTc 419 ,no Acute ST changes   Assessment & Plan:    Active Problems:   Closed fracture of multiple pubic rami, left, initial encounter (HCC)   1. Left pubic rami fracture 1. Likely to be nonsurgical defer to day team for Ortho consult 2. PT consulted 3. Pain scale with Tylenol, oxycodone, morphine 4. Continue to monitor 2. Hypertension 1. Continue home medications including nebivolol, hydralazine, and ARB 2. Continue to monitor 3. Dementia 1. At baseline patient is disoriented to place, time, context 2. Continue to monitor 4. Leukocytosis 1. Likely acute phase reactant in the setting of recent fracture and fall 2. Trend in the a.m. 3. Afebrile, no signs of infection Dispo - patient likely to go back to [place] when pain can be adequately controlled with PO medication   DVT Prophylaxis-Heparin- SCDs   AM Labs Ordered, also please review Full Orders  Family Communication: No family at bedside, no call due to the late hour  Code Status: DNR  Admission status: Observation/Inpatient :The appropriate admission status for this patient is INPATIENT. Inpatient status is judged to be reasonable and necessary in order to provide the required intensity of service to ensure the patient's safety. The patient's presenting symptoms, physical exam findings, and initial radiographic and laboratory data in the context of their chronic comorbidities is felt to place them at high risk for further clinical deterioration. Furthermore, it is not anticipated that the patient will be medically stable for discharge from the hospital within 2 midnights of admission. The following factors support the admission status of inpatient.     The patient's presenting  symptoms include left hip pain. The initial radiographic and laboratory data are worrisome because of left pubic rami fractures. The chronic co-morbidities include hypertension, dementia, osteoporosis.       * I certify that at the point of admission it is my clinical judgment that the patient will require inpatient hospital care spanning beyond 2 midnights from the point of admission due to high intensity of service, high risk for further deterioration and high frequency of surveillance required.*  Time spent in minutes : 64   Braylee Lal B Zierle-Ghosh DO

## 2020-06-16 NOTE — ED Notes (Signed)
[facility] called for transport. 

## 2020-06-17 DIAGNOSIS — S32592S Other specified fracture of left pubis, sequela: Secondary | ICD-10-CM | POA: Diagnosis not present

## 2020-06-17 DIAGNOSIS — Z882 Allergy status to sulfonamides status: Secondary | ICD-10-CM | POA: Diagnosis not present

## 2020-06-17 DIAGNOSIS — Z23 Encounter for immunization: Secondary | ICD-10-CM | POA: Diagnosis present

## 2020-06-17 DIAGNOSIS — Z20822 Contact with and (suspected) exposure to covid-19: Secondary | ICD-10-CM | POA: Diagnosis present

## 2020-06-17 DIAGNOSIS — H409 Unspecified glaucoma: Secondary | ICD-10-CM | POA: Diagnosis present

## 2020-06-17 DIAGNOSIS — Z66 Do not resuscitate: Secondary | ICD-10-CM | POA: Diagnosis present

## 2020-06-17 DIAGNOSIS — W19XXXA Unspecified fall, initial encounter: Secondary | ICD-10-CM | POA: Diagnosis present

## 2020-06-17 DIAGNOSIS — Z885 Allergy status to narcotic agent status: Secondary | ICD-10-CM | POA: Diagnosis not present

## 2020-06-17 DIAGNOSIS — Z888 Allergy status to other drugs, medicaments and biological substances status: Secondary | ICD-10-CM | POA: Diagnosis not present

## 2020-06-17 DIAGNOSIS — Z7189 Other specified counseling: Secondary | ICD-10-CM | POA: Diagnosis not present

## 2020-06-17 DIAGNOSIS — M80852A Other osteoporosis with current pathological fracture, left femur, initial encounter for fracture: Secondary | ICD-10-CM | POA: Diagnosis present

## 2020-06-17 DIAGNOSIS — K219 Gastro-esophageal reflux disease without esophagitis: Secondary | ICD-10-CM | POA: Diagnosis present

## 2020-06-17 DIAGNOSIS — F0390 Unspecified dementia without behavioral disturbance: Secondary | ICD-10-CM | POA: Diagnosis present

## 2020-06-17 DIAGNOSIS — S32592A Other specified fracture of left pubis, initial encounter for closed fracture: Secondary | ICD-10-CM | POA: Diagnosis not present

## 2020-06-17 DIAGNOSIS — E876 Hypokalemia: Secondary | ICD-10-CM | POA: Diagnosis not present

## 2020-06-17 DIAGNOSIS — S51811A Laceration without foreign body of right forearm, initial encounter: Secondary | ICD-10-CM | POA: Diagnosis present

## 2020-06-17 DIAGNOSIS — N390 Urinary tract infection, site not specified: Secondary | ICD-10-CM | POA: Diagnosis present

## 2020-06-17 DIAGNOSIS — Y92129 Unspecified place in nursing home as the place of occurrence of the external cause: Secondary | ICD-10-CM | POA: Diagnosis not present

## 2020-06-17 DIAGNOSIS — N179 Acute kidney failure, unspecified: Secondary | ICD-10-CM | POA: Diagnosis present

## 2020-06-17 DIAGNOSIS — I10 Essential (primary) hypertension: Secondary | ICD-10-CM | POA: Diagnosis present

## 2020-06-17 DIAGNOSIS — F419 Anxiety disorder, unspecified: Secondary | ICD-10-CM | POA: Diagnosis present

## 2020-06-17 DIAGNOSIS — B9620 Unspecified Escherichia coli [E. coli] as the cause of diseases classified elsewhere: Secondary | ICD-10-CM | POA: Diagnosis present

## 2020-06-17 DIAGNOSIS — Z515 Encounter for palliative care: Secondary | ICD-10-CM | POA: Diagnosis not present

## 2020-06-17 DIAGNOSIS — Z79899 Other long term (current) drug therapy: Secondary | ICD-10-CM | POA: Diagnosis not present

## 2020-06-17 DIAGNOSIS — E860 Dehydration: Secondary | ICD-10-CM | POA: Diagnosis present

## 2020-06-17 DIAGNOSIS — R296 Repeated falls: Secondary | ICD-10-CM | POA: Diagnosis present

## 2020-06-17 LAB — CBC
HCT: 35.8 % — ABNORMAL LOW (ref 36.0–46.0)
Hemoglobin: 11.1 g/dL — ABNORMAL LOW (ref 12.0–15.0)
MCH: 30.7 pg (ref 26.0–34.0)
MCHC: 31 g/dL (ref 30.0–36.0)
MCV: 99.2 fL (ref 80.0–100.0)
Platelets: 169 10*3/uL (ref 150–400)
RBC: 3.61 MIL/uL — ABNORMAL LOW (ref 3.87–5.11)
RDW: 14.8 % (ref 11.5–15.5)
WBC: 10.1 10*3/uL (ref 4.0–10.5)
nRBC: 0 % (ref 0.0–0.2)

## 2020-06-17 LAB — BASIC METABOLIC PANEL
Anion gap: 9 (ref 5–15)
BUN: 33 mg/dL — ABNORMAL HIGH (ref 8–23)
CO2: 30 mmol/L (ref 22–32)
Calcium: 8.3 mg/dL — ABNORMAL LOW (ref 8.9–10.3)
Chloride: 103 mmol/L (ref 98–111)
Creatinine, Ser: 1.47 mg/dL — ABNORMAL HIGH (ref 0.44–1.00)
GFR, Estimated: 29 mL/min — ABNORMAL LOW (ref >60)
Glucose, Bld: 89 mg/dL (ref 70–99)
Potassium: 4 mmol/L (ref 3.5–5.1)
Sodium: 142 mmol/L (ref 135–145)

## 2020-06-17 MED ORDER — SODIUM CHLORIDE 0.9 % IV SOLN
1.0000 g | INTRAVENOUS | Status: DC
  Administered 2020-06-17 – 2020-06-19 (×3): 1 g via INTRAVENOUS
  Filled 2020-06-17 (×2): qty 1
  Filled 2020-06-17: qty 0.97

## 2020-06-17 NOTE — Progress Notes (Signed)
[facility]  PROGRESS NOTE  Felicia Wells WNI:[id number] DOB: April 16, 1919 DOA: 06/15/2020 PCP: Kari Baars, MD   Brief HPI:   86 year old female with history of osteoporosis, hypertension, glaucoma, GERD, anxiety came to ED with complaints of hip pain. Patient demented does not provide any history. Patient reportedly fell on 06/14/2020 night. Patient had ambulatory dysfunction after that. In the ED imaging studies showed pubic rami fracture on the left.    Subjective   Patient seen and examined, she is more alert today.  UA obtained yesterday was abnormal, urine culture has been obtained.   Assessment/Plan:     1. Left pubic rami fracture-pain control with morphine as needed, weightbearing as tolerated.  PT consulted. 2. UTI-patient has abnormal UA, urine culture obtained yesterday.  WBC is down to 10,000.  She is afebrile.  Her mental status changes may be due to underlying UTI.  Will start IV ceftriaxone 1 g every 24 hours.  Follow urine culture results. 3. Altered mental status-likely has underlying dementia, exacerbated by opiates given during hospitalization for pubic rami fracture along with UTI.  Dose of morphine has been reduced to 1 mg every 3 hours as needed.  Started on IV antibiotics as above.  Follow urine culture results. 4. Hypertension-continue hydralazine, will hold Lasix due to development of AKI. 5. Acute kidney injury-patient's creatinine went from 1.06-1.47, likely poor p.o. intake/dehydration.  Patient was somnolent, did not drink much fluids.  Started on gentle IV hydration with normal saline at 75 mill per hour.  Lasix will be on hold.  Follow BMP in am.     COVID-19 Labs  No results for input(s): DDIMER, FERRITIN, LDH, CRP in the last 72 hours.  Lab Results  Component Value Date   SARSCOV2NAA NEGATIVE 06/16/2020     Scheduled medications:   . cholecalciferol  500 Units Oral BID  . famotidine  20 mg Oral Daily  . furosemide  20 mg Oral  Daily  . heparin  5,000 Units Subcutaneous Q8H  . hydrALAZINE  10 mg Oral BID  . irbesartan  300 mg Oral Daily  . multivitamins with iron  1 tablet Oral Daily  . nebivolol  10 mg Oral BID    SpO2: 96 % O2 Flow Rate (L/min): 2 L/min    CBC: Recent Labs  Lab 06/16/20 0002 06/17/20 0413  WBC 16.0* 10.1  NEUTROABS 13.7*  --   HGB 12.5 11.1*  HCT 38.8 35.8*  MCV 96.0 99.2  PLT 215 169    Basic Metabolic Panel: Recent Labs  Lab 06/16/20 0002 06/17/20 0413  NA 140 142  K 3.6 4.0  CL 95* 103  CO2 29 30  GLUCOSE 146* 89  BUN 23 33*  CREATININE 1.06* 1.47*  CALCIUM 9.6 8.3*     Liver Function Tests: Recent Labs  Lab 06/16/20 0002  AST 30  ALT 21  ALKPHOS 81  BILITOT 0.9  PROT 7.4  ALBUMIN 3.9     Antibiotics: Anti-infectives (From admission, onward)   Start     Dose/Rate Route Frequency Ordered Stop   06/17/20 0930  cefTRIAXone (ROCEPHIN) 1 g in sodium chloride 0.9 % 100 mL IVPB        1 g 200 mL/hr over 30 Minutes Intravenous Every 24 hours 06/17/20 0843         DVT prophylaxis: Heparin  Code Status: DNR  Family Communication: Discussed with patient's daughter-in-law at bedside on 06/16/2020    Status is: Inpatient  Dispo: The patient is from:  Skilled nursing facility         Anticipated d/c is to: Skilled nursing facility              Anticipated d/c date is: 06/18/2020              Patient currently not medically stable for discharge  Barrier to discharge-ongoing treatment for UTI, pain control for pelvic fracture          Consultants:    Procedures:     Objective   Vitals:   06/16/20 0946 06/16/20 1344 06/16/20 2117 06/17/20 0554  BP: 110/65 106/60 (!) 91/58 100/61  Pulse: (!) 59 63 (!) 56 (!) 59  Resp: 17 16 15 14   Temp: (!) 97.5 F (36.4 C) (!) 97.5 F (36.4 C) 98 F (36.7 C) 98 F (36.7 C)  TempSrc:  Axillary Oral Oral  SpO2: 98% 99% 96% 96%  Weight:      Height:        Intake/Output Summary (Last 24  hours) at 06/17/2020 0848 Last data filed at 06/17/2020 0554 Gross per 24 hour  Intake 600.55 ml  Output 290 ml  Net 310.55 ml    10/15 1901 - 10/17 0700 In: 600.6 [I.V.:600.6] Out: 290 [Urine:290]  Filed Weights   06/15/20 2129  Weight: 72.6 kg    Physical Examination:    General: Appears in no acute distress  Cardiovascular: S1-S2, regular, no murmur auscultated  Respiratory: Clear to auscultation bilaterally, no wheezing or crackles auscultated  Abdomen: Abdomen is soft, nontender, no organomegaly  Extremities: No edema in the lower extremities  Neurologic: Alert, pleasantly confused, no focal deficit noted    Data Reviewed:   Recent Results (from the past 240 hour(s))  Respiratory Panel by RT PCR (Flu A&B, Covid) - Nasopharyngeal Swab     Status: None   Collection Time: 06/16/20 12:15 AM   Specimen: Nasopharyngeal Swab  Result Value Ref Range Status   SARS Coronavirus 2 by RT PCR NEGATIVE NEGATIVE Final    Comment: (NOTE) SARS-CoV-2 target nucleic acids are NOT DETECTED.  The SARS-CoV-2 RNA is generally detectable in upper respiratoy specimens during the acute phase of infection. The lowest concentration of SARS-CoV-2 viral copies this assay can detect is 131 copies/mL. A negative result does not preclude SARS-Cov-2 infection and should not be used as the sole basis for treatment or other patient management decisions. A negative result may occur with  improper specimen collection/handling, submission of specimen other than nasopharyngeal swab, presence of viral mutation(s) within the areas targeted by this assay, and inadequate number of viral copies (<131 copies/mL). A negative result must be combined with clinical observations, patient history, and epidemiological information. The expected result is Negative.  Fact Sheet for Patients:  https://www.moore.com/https://www.fda.gov/media/142436/download  Fact Sheet for Healthcare Providers:   https://www.young.biz/https://www.fda.gov/media/142435/download  This test is no t yet approved or cleared by the [place] FDA and  has been authorized for detection and/or diagnosis of SARS-CoV-2 by FDA under an Emergency Use Authorization (EUA). This EUA will remain  in effect (meaning this test can be used) for the duration of the COVID-19 declaration under Section 564(b)(1) of the Act, 21 U.S.C. section 360bbb-3(b)(1), unless the authorization is terminated or revoked sooner.     Influenza A by PCR NEGATIVE NEGATIVE Final   Influenza B by PCR NEGATIVE NEGATIVE Final    Comment: (NOTE) The Xpert Xpress SARS-CoV-2/FLU/RSV assay is intended as an aid in  the diagnosis of influenza from Nasopharyngeal swab specimens and  should not be used as a sole basis for treatment. Nasal washings and  aspirates are unacceptable for Xpert Xpress SARS-CoV-2/FLU/RSV  testing.  Fact Sheet for Patients: https://www.moore.com/  Fact Sheet for Healthcare Providers: https://www.young.biz/  This test is not yet approved or cleared by the [place] FDA and  has been authorized for detection and/or diagnosis of SARS-CoV-2 by  FDA under an Emergency Use Authorization (EUA). This EUA will remain  in effect (meaning this test can be used) for the duration of the  Covid-19 declaration under Section 564(b)(1) of the Act, 21  U.S.C. section 360bbb-3(b)(1), unless the authorization is  terminated or revoked. Performed at [facility], [address], [place], [place] [address]     No results for input(s): LIPASE, AMYLASE in the last 168 hours. No results for input(s): AMMONIA in the last 168 hours.  Cardiac Enzymes: No results for input(s): CKTOTAL, CKMB, CKMBINDEX, TROPONINI in the last 168 hours. BNP (last 3 results) No results for input(s): BNP in the last 8760 hours.  ProBNP (last 3 results) No results for input(s): PROBNP in the last 8760 hours.  Studies:  DG Chest 1  View  Result Date: 06/15/2020 CLINICAL DATA:  Possible patient fall.  Unable to ambulate today. EXAM: CHEST  1 VIEW COMPARISON:  Chest x-ray 06/21/2019 FINDINGS: The heart size and mediastinal contours are unchanged with cardiomegaly. No focal consolidation. No pulmonary edema. No pleural effusion. No pneumothorax. No acute osseous abnormality. Multilevel degenerative changes of the spine. IMPRESSION: No acute cardiopulmonary disease. Electronically Signed   By: Tish Frederickson M.D.   On: 06/15/2020 23:56   DG Thoracic Spine 2 View  Result Date: 06/15/2020 CLINICAL DATA:  Recent fall with chest pain, initial encounter EXAM: THORACIC SPINE 2 VIEWS COMPARISON:  06/21/2019 FINDINGS: Vertebral body height is well maintained. Mild osteophytic changes are seen. No paraspinal mass lesion is noted. Pedicles are within normal limits. IMPRESSION: Mild degenerative change without acute abnormality. Electronically Signed   By: Alcide Clever M.D.   On: 06/15/2020 23:22   DG Lumbar Spine 2-3 Views  Result Date: 06/15/2020 CLINICAL DATA:  Recent fall with low back pain EXAM: LUMBAR SPINE - 2-3 VIEW COMPARISON:  04/21/2011 FINDINGS: Five lumbar type vertebral bodies are well visualized. Vertebral body height is well maintained. Disc space narrowing is noted throughout the lumbar spine. Degenerative changes are seen with mild retrolisthesis of L4 on L5. No paraspinal soft tissue abnormality is noted. The known pubic rami fractures on the left are again seen. IMPRESSION: Pubic rami fractures on the left. Multilevel degenerative change in the lumbar spine without acute abnormality. Electronically Signed   By: Alcide Clever M.D.   On: 06/15/2020 23:25   DG Elbow Complete Right  Result Date: 06/15/2020 CLINICAL DATA:  Recent fall with right elbow pain, initial encounter EXAM: RIGHT ELBOW - COMPLETE 3+ VIEW COMPARISON:  None. FINDINGS: There is no evidence of fracture, dislocation, or joint effusion. There is no evidence  of arthropathy or other focal bone abnormality. Soft tissues are unremarkable. IMPRESSION: No acute abnormality noted. Electronically Signed   By: Alcide Clever M.D.   On: 06/15/2020 23:21   CT Head Wo Contrast  Result Date: 06/15/2020 CLINICAL DATA:  unknown per staff if patient fell. Staff reports that patient has not been able to ambulate today as normal and is complaining of left hip pain and patient has an abrasion/ski EXAM: CT HEAD WITHOUT CONTRAST CT CERVICAL SPINE WITHOUT CONTRAST TECHNIQUE: Multidetector CT imaging of the head and  cervical spine was performed following the standard protocol without intravenous contrast. Multiplanar CT image reconstructions of the cervical spine were also generated. COMPARISON:  CT C-spine 10/20/2013, CT head 06/21/2019. FINDINGS: CT HEAD FINDINGS Brain: Cerebral ventricle sizes are concordant with the degree of cerebral volume loss. Patchy and confluent areas of decreased attenuation are noted throughout the deep and periventricular white matter of the cerebral hemispheres bilaterally, compatible with chronic microvascular ischemic disease. Incidentally noted left mid cranial fossa arachnoid cyst. Similar-appearing densely calcified pineal gland. No evidence of large-territorial acute infarction. No parenchymal hemorrhage. No mass lesion. No extra-axial collection. No mass effect or midline shift. No hydrocephalus. Basilar cisterns are patent. Vascular: No hyperdense vessel. Atherosclerotic calcifications are present within the cavernous internal carotid arteries. Skull: No acute fracture or focal lesion. Sinuses/Orbits: Paranasal sinuses and mastoid air cells are clear. Bilateral lens replacement. Orbits are unremarkable. Other: None. CT CERVICAL SPINE FINDINGS Alignment: Reversal of the normal cervical lordosis likely due to positioning and degenerative changes. Interval development of grade 1 anterolisthesis of C2 on C3 with no findings to suggest traumatic etiology.  No skipped facets, no associated fracture. Similar-appearing grade 1 anterolisthesis of C4 on C5. Similar-appearing grade 1 anterolisthesis of C7 on T1. Skull base and vertebrae: Multilevel severe degenerative changes of the spine. No acute fracture. No primary bone lesion or focal pathologic process. Soft tissues and spinal canal: No prevertebral fluid or swelling. No visible canal hematoma. Disc levels:  Maintained. Upper chest: Unremarkable. Other: None. IMPRESSION: 1. No acute intracranial abnormality. 2. No acute displaced fracture or traumatic listhesis of the cervical spine. Electronically Signed   By: Tish Frederickson M.D.   On: 06/15/2020 23:46   CT Cervical Spine Wo Contrast  Result Date: 06/15/2020 CLINICAL DATA:  unknown per staff if patient fell. Staff reports that patient has not been able to ambulate today as normal and is complaining of left hip pain and patient has an abrasion/ski EXAM: CT HEAD WITHOUT CONTRAST CT CERVICAL SPINE WITHOUT CONTRAST TECHNIQUE: Multidetector CT imaging of the head and cervical spine was performed following the standard protocol without intravenous contrast. Multiplanar CT image reconstructions of the cervical spine were also generated. COMPARISON:  CT C-spine 10/20/2013, CT head 06/21/2019. FINDINGS: CT HEAD FINDINGS Brain: Cerebral ventricle sizes are concordant with the degree of cerebral volume loss. Patchy and confluent areas of decreased attenuation are noted throughout the deep and periventricular white matter of the cerebral hemispheres bilaterally, compatible with chronic microvascular ischemic disease. Incidentally noted left mid cranial fossa arachnoid cyst. Similar-appearing densely calcified pineal gland. No evidence of large-territorial acute infarction. No parenchymal hemorrhage. No mass lesion. No extra-axial collection. No mass effect or midline shift. No hydrocephalus. Basilar cisterns are patent. Vascular: No hyperdense vessel. Atherosclerotic  calcifications are present within the cavernous internal carotid arteries. Skull: No acute fracture or focal lesion. Sinuses/Orbits: Paranasal sinuses and mastoid air cells are clear. Bilateral lens replacement. Orbits are unremarkable. Other: None. CT CERVICAL SPINE FINDINGS Alignment: Reversal of the normal cervical lordosis likely due to positioning and degenerative changes. Interval development of grade 1 anterolisthesis of C2 on C3 with no findings to suggest traumatic etiology. No skipped facets, no associated fracture. Similar-appearing grade 1 anterolisthesis of C4 on C5. Similar-appearing grade 1 anterolisthesis of C7 on T1. Skull base and vertebrae: Multilevel severe degenerative changes of the spine. No acute fracture. No primary bone lesion or focal pathologic process. Soft tissues and spinal canal: No prevertebral fluid or swelling. No visible canal hematoma. Disc levels:  Maintained.  Upper chest: Unremarkable. Other: None. IMPRESSION: 1. No acute intracranial abnormality. 2. No acute displaced fracture or traumatic listhesis of the cervical spine. Electronically Signed   By: Tish Frederickson M.D.   On: 06/15/2020 23:46   DG HIPS BILAT WITH PELVIS 3-4 VIEWS  Result Date: 06/15/2020 CLINICAL DATA:  Recent fall with hip pain, initial encounter EXAM: DG HIP (WITH OR WITHOUT PELVIS) 4V BILAT COMPARISON:  07/18/2012 FINDINGS: There are fractures involving the superior and inferior pubic rami on the left adjacent to the pubic symphysis. No definitive sacral fracture is seen. Degenerative changes of the hip joints are noted. No acute fracture is seen in the proximal femurs. IMPRESSION: No hip fracture identified. Fractures through the superior and inferior pubic rami on the left. Electronically Signed   By: Alcide Clever M.D.   On: 06/15/2020 23:20       Jodean Valade S Aprile Dickenson   [facility] If 7PM-7AM, please contact night-coverage at [email or web address], Office  [phone]   06/17/2020, 8:48 AM  LOS:  0 days

## 2020-06-18 LAB — BASIC METABOLIC PANEL
Anion gap: 10 (ref 5–15)
BUN: 40 mg/dL — ABNORMAL HIGH (ref 8–23)
CO2: 28 mmol/L (ref 22–32)
Calcium: 8.5 mg/dL — ABNORMAL LOW (ref 8.9–10.3)
Chloride: 107 mmol/L (ref 98–111)
Creatinine, Ser: 1.53 mg/dL — ABNORMAL HIGH (ref 0.44–1.00)
GFR, Estimated: 28 mL/min — ABNORMAL LOW (ref >60)
Glucose, Bld: 103 mg/dL — ABNORMAL HIGH (ref 70–99)
Potassium: 4.6 mmol/L (ref 3.5–5.1)
Sodium: 145 mmol/L (ref 135–145)

## 2020-06-18 MED ORDER — LORAZEPAM 2 MG/ML IJ SOLN
0.5000 mg | INTRAMUSCULAR | Status: DC | PRN
  Administered 2020-06-19 – 2020-06-20 (×2): 0.5 mg via INTRAMUSCULAR
  Filled 2020-06-18 (×2): qty 1

## 2020-06-18 MED ORDER — LORAZEPAM 0.5 MG PO TABS
0.5000 mg | ORAL_TABLET | ORAL | Status: DC | PRN
  Administered 2020-06-18 – 2020-06-20 (×4): 0.5 mg via ORAL
  Filled 2020-06-18 (×4): qty 1

## 2020-06-18 NOTE — TOC Initial Note (Signed)
Transition of Care [facility]) - Initial/Assessment Note    Patient Details  Name: Felicia Wells MRN: [id number] Date of Birth: 09-Feb-1919  [facility]) CM/SW Contact:    Amada Jupiter, LCSW Phone Number: 06/18/2020, 1:27 PM  Clinical Narrative:                 Attempted to speak with pt, however, she is unresponsive to me.  Moaning in bed with eyes closed and nursing notes significant dementia/ confusion.   Contacted pt's son, Ellaina Schuler, who reports that pt "had been really good until the last couple of months".  He notes pt showed in increase in aggressive behaviors and was "angry all the time... combative".  He questions if this may have been caused by a UTI.  She has had 3 known falls and this is the 2nd time she has suffered a fracture.  Son has spoken with the ALF ([facility] in [place]) and all are agreed that they can no longer meet her care needs and he wants to pursue SNF.  Will begin process.  Expected Discharge Plan: Skilled Nursing Facility Barriers to Discharge: Continued Medical Work up   Patient Goals and CMS Choice     Choice offered to / list presented to : Adult Children  Expected Discharge Plan and Services Expected Discharge Plan: Skilled Nursing Facility In-house Referral: Clinical Social Work   Post Acute Care Choice: Skilled Nursing Facility Living arrangements for the past 2 months: Assisted Living Facility                                      Prior Living Arrangements/Services Living arrangements for the past 2 months: Assisted Living Facility Lives with:: Facility Resident Patient language and need for interpreter reviewed:: Yes Do you feel safe going back to the place where you live?: No   son does not feel that facility can meet pt's current care needs - facility is agreed with son  Need for Family Participation in Patient Care: No (Comment) Care giver support system in place?: No (comment)   Criminal Activity/Legal Involvement  Pertinent to Current Situation/Hospitalization: No - Comment as needed  Activities of Daily Living Home Assistive Devices/Equipment: Other (Comment) (unknown) ADL Screening (condition at time of admission) Patient's cognitive ability adequate to safely complete daily activities?: No Is the patient deaf or have difficulty hearing?: No Does the patient have difficulty seeing, even when wearing glasses/contacts?: No Does the patient have difficulty concentrating, remembering, or making decisions?: Yes Patient able to express need for assistance with ADLs?: Yes Does the patient have difficulty dressing or bathing?: Yes Independently performs ADLs?: No Communication: Independent Dressing (OT): Dependent Is this a change from baseline?: Pre-admission baseline Grooming: Dependent Is this a change from baseline?: Pre-admission baseline Feeding: Needs assistance Is this a change from baseline?: Pre-admission baseline Bathing: Dependent Is this a change from baseline?: Pre-admission baseline Toileting: Dependent Is this a change from baseline?: Pre-admission baseline In/Out Bed: Dependent Is this a change from baseline?: Pre-admission baseline Walks in Home: Dependent Is this a change from baseline?: Pre-admission baseline Does the patient have difficulty walking or climbing stairs?: Yes Weakness of Legs: Both Weakness of Arms/Hands: None  Permission Sought/Granted Permission sought to share information with : Family Supports    Share Information with NAME: son, Nusaiba Guallpa @ [phone]           Emotional Assessment Appearance:: Appears stated  age Attitude/Demeanor/Rapport: Unable to Assess Affect (typically observed): Unable to Assess   Alcohol / Substance Use: Not Applicable Psych Involvement: No (comment)  Admission diagnosis:  Fall [W19.XXXA] Closed fracture of multiple pubic rami, left, initial encounter (HCC) [S32.592A] Skin tear of forearm without complication, initial  encounter [S51.819A] UTI (urinary tract infection) [N39.0] Patient Active Problem List   Diagnosis Date Noted  . UTI (urinary tract infection) 06/17/2020  . Closed fracture of multiple pubic rami, left, initial encounter (HCC) 06/15/2020  . Dementia with behavioral disturbance (HCC) 04/11/2016  . Lower GI bleed 04/09/2016  . Diverticulosis large intestine w/o perforation or abscess w/bleeding   . Palliative care encounter   . Hematochezia 09/12/2015  . DNR (do not resuscitate) 09/12/2015  . Bleeding gastrointestinal   . CAP (community acquired pneumonia) 12/04/2012  . Hyponatremia 12/04/2012  . Muscular deconditioning 12/04/2012  . Acute delirium 12/04/2012  . Fracture of acetabulum (HCC) 07/19/2012  . Essential hypertension 07/19/2012  . Meniere's disease 07/19/2012  . Muscle weakness (generalized) 06/03/2011  . Difficulty in walking(719.7) 06/03/2011   PCP:  Kari Baars, MD Pharmacy:   [facility] [place], [place] - [address] [place] [address] Phone: [phone] Fax: [phone]  [facility] - [place], [place] [address] [place] [address] Phone: [phone] Fax: [phone]     Social Determinants of Health (SDOH) Interventions    Readmission Risk Interventions No flowsheet data found.

## 2020-06-18 NOTE — Progress Notes (Signed)
Physical Therapy Treatment Patient Details Name: Felicia Wells MRN: [id number] DOB: [birth year] Today's Date: 06/18/2020    History of Present Illness 86 y.o. female, with history of osteoporosis, hypertension, glaucoma, GERD, anxiety, and more presents to the ED with a chief complaint of hip pain.  Patient is demented and is not able to provide any history.  Per report patient fell the night of 10/14 - 10/15.  Staff at [facility] but she was okay but then noticed that she was having ambulatory dysfunction today.  Staff reported that patient was complaining of left-sided hip pain.  She was transported to the ER for further evaluation of his hip pain.   X-ray of lumbar spine shows pubic rami fractures on the left, and multilevel degenerative change in the lumbar spine without acute abnormality    PT Comments    Pt continues to require extensive assist for bed mobility. She is more alert and making eye contact today. Verbally perseverates at times. Assisted RN with UE positioning for placement of steri-srips and dressing on R UE laceration. Recommend SNF unless [facility] able to provide full assist.   Follow Up Recommendations  SNF (vs return to ALF if able to provide assist )     Equipment Recommendations  None recommended by PT    Recommendations for Other Services       Precautions / Restrictions Restrictions Weight Bearing Restrictions: No    Mobility  Bed Mobility Overal bed mobility: Needs Assistance Bed Mobility: Supine to Sit;Sit to Supine     Supine to sit: Total assist;+2 for physical assistance;+2 for safety/equipment Sit to supine: Total assist;+2 for safety/equipment;+2 for physical assistance   General bed mobility comments: bed pad utilized to assist scooting and to elevate and lower trunk  Transfers                    Ambulation/Gait                 Stairs             Wheelchair Mobility    Modified Rankin (Stroke Patients Only)        Balance     Sitting balance-Leahy Scale: Zero Sitting balance - Comments: limited by ?pain Postural control: Posterior lean                                  Cognition Arousal/Alertness: Awake/alert Behavior During Therapy: Restless Overall Cognitive Status: History of cognitive impairments - at baseline                                 General Comments: alert, making eye contact today. smiles briefly      Exercises      General Comments        Pertinent Vitals/Pain Pain Assessment: Faces Pain Location: unable to state  Pain Descriptors / Indicators: Grimacing Pain Intervention(s): Monitored during session;Repositioned;Premedicated before session    Home Living                      Prior Function            PT Goals (current goals can now be found in the care plan section) Acute Rehab PT Goals Patient Stated Goal: unable to state PT Goal Formulation: Patient unable to participate in goal setting Time For Goal Achievement: 06/30/20  Potential to Achieve Goals: Fair Progress towards PT goals: Not progressing toward goals - comment (pain and cognition)    Frequency    Min 2X/week      PT Plan Current plan remains appropriate    Co-evaluation              AM-PAC PT "6 Clicks" Mobility   Outcome Measure  Help needed turning from your back to your side while in a flat bed without using bedrails?: Total Help needed moving from lying on your back to sitting on the side of a flat bed without using bedrails?: Total Help needed moving to and from a bed to a chair (including a wheelchair)?: Total Help needed standing up from a chair using your arms (e.g., wheelchair or bedside chair)?: Total Help needed to walk in hospital room?: Total Help needed climbing 3-5 steps with a railing? : Total 6 Click Score: 6    End of Session   Activity Tolerance: Other (comment) (limited by cognition) Patient left: in bed;with  call bell/phone within reach;with bed alarm set Nurse Communication: Mobility status PT Visit Diagnosis: Other abnormalities of gait and mobility (R26.89);History of falling (Z91.81)     Time: 6010-9323 PT Time Calculation (min) (ACUTE ONLY): 20 min  Charges:  $Therapeutic Activity: 8-22 mins                     Delice Bison, PT  Acute Rehab Dept [facility]) [phone] Pager [phone]  06/18/2020    [facility] 06/18/2020, 3:56 PM

## 2020-06-18 NOTE — NC FL2 (Signed)
[place] MEDICAID FL2 LEVEL OF CARE SCREENING TOOL     IDENTIFICATION  Patient Name: [place] Birthdate: 1918/12/06 Sex: female Admission Date (Current Location): 06/15/2020  [facility] and [place] Number:  Producer, television/film/video and Address:  [facility],  [address] [place], [place] [address]      Provider Number: [id number]  Attending Physician Name and Address:  Meredeth Ide, MD  Relative Name and Phone Number:  son, Ritaj Dullea @ [phone]    Current Level of Care: Hospital Recommended Level of Care: Skilled Nursing Facility Prior Approval Number:    Date Approved/Denied:   PASRR Number:    Discharge Plan: SNF    Current Diagnoses: Patient Active Problem List   Diagnosis Date Noted  . UTI (urinary tract infection) 06/17/2020  . Closed fracture of multiple pubic rami, left, initial encounter (HCC) 06/15/2020  . Dementia with behavioral disturbance (HCC) 04/11/2016  . Lower GI bleed 04/09/2016  . Diverticulosis large intestine w/o perforation or abscess w/bleeding   . Palliative care encounter   . Hematochezia 09/12/2015  . DNR (do not resuscitate) 09/12/2015  . Bleeding gastrointestinal   . CAP (community acquired pneumonia) 12/04/2012  . Hyponatremia 12/04/2012  . Muscular deconditioning 12/04/2012  . Acute delirium 12/04/2012  . Fracture of acetabulum (HCC) 07/19/2012  . Essential hypertension 07/19/2012  . Meniere's disease 07/19/2012  . Muscle weakness (generalized) 06/03/2011  . Difficulty in walking(719.7) 06/03/2011    Orientation RESPIRATION BLADDER Height & Weight        Normal Incontinent Weight: 160 lb (72.6 kg) Height:  5\' 6"  (167.6 cm)  BEHAVIORAL SYMPTOMS/MOOD NEUROLOGICAL BOWEL NUTRITION STATUS      Incontinent    AMBULATORY STATUS COMMUNICATION OF NEEDS Skin   Extensive Assist Does not communicate Normal                       Personal Care Assistance Level of Assistance  Bathing, Dressing, Feeding Bathing  Assistance: Maximum assistance Feeding assistance: Limited assistance Dressing Assistance: Maximum assistance     Functional Limitations Info             SPECIAL CARE FACTORS FREQUENCY  PT (By licensed PT), OT (By licensed OT)     PT Frequency: 5x/wk OT Frequency: 5x/wk            Contractures Contractures Info: Not present    Additional Factors Info  Code Status, Allergies Code Status Info: DNR Allergies Info: see MAR           Current Medications (06/18/2020):  This is the current hospital active medication list Current Facility-Administered Medications  Medication Dose Route Frequency Provider Last Rate Last Admin  . 0.9 %  sodium chloride infusion   Intravenous Continuous 06/20/2020, MD 100 mL/hr at 06/18/20 06/20/20 Rate Change at 06/18/20 06/20/20  . acetaminophen (TYLENOL) tablet 650 mg  650 mg Oral Q6H PRN Zierle-Ghosh, Asia B, DO       Or  . acetaminophen (TYLENOL) suppository 650 mg  650 mg Rectal Q6H PRN Zierle-Ghosh, Asia B, DO      . cefTRIAXone (ROCEPHIN) 1 g in sodium chloride 0.9 % 100 mL IVPB  1 g Intravenous Q24H 1761, MD 200 mL/hr at 06/18/20 0824 1 g at 06/18/20 0824  . cholecalciferol (VITAMIN D3) tablet 500 Units  500 Units Oral BID Zierle-Ghosh, Asia B, DO      . famotidine (PEPCID) tablet 20 mg  20 mg Oral Daily Zierle-Ghosh,  Asia B, DO      . heparin injection 5,000 Units  5,000 Units Subcutaneous Q8H Zierle-Ghosh, Asia B, DO   5,000 Units at 06/18/20 0448  . hydrALAZINE (APRESOLINE) tablet 10 mg  10 mg Oral BID Zierle-Ghosh, Asia B, DO      . morphine 2 MG/ML injection 1 mg  1 mg Intravenous Q3H PRN Meredeth Ide, MD   1 mg at 06/18/20 1224  . multivitamins with iron tablet 1 tablet  1 tablet Oral Daily Zierle-Ghosh, Asia B, DO      . nebivolol (BYSTOLIC) tablet 10 mg  10 mg Oral BID Zierle-Ghosh, Asia B, DO      . ondansetron (ZOFRAN) tablet 4 mg  4 mg Oral Q6H PRN Zierle-Ghosh, Asia B, DO       Or  . ondansetron (ZOFRAN) injection 4 mg   4 mg Intravenous Q6H PRN Zierle-Ghosh, Asia B, DO   4 mg at 06/16/20 0135  . oxyCODONE (Oxy IR/ROXICODONE) immediate release tablet 5 mg  5 mg Oral Q4H PRN Zierle-Ghosh, Asia B, DO      . traZODone (DESYREL) tablet 25 mg  25 mg Oral QHS PRN Zierle-Ghosh, Asia B, DO         Discharge Medications: Please see discharge summary for a list of discharge medications.  Relevant Imaging Results:  Relevant Lab Results:   Additional Information SS# [id number]  Amada Jupiter, LCSW

## 2020-06-18 NOTE — Plan of Care (Signed)

## 2020-06-18 NOTE — Progress Notes (Addendum)
[facility]  PROGRESS NOTE  Felicia Wells EXB:[id number] DOB: Jan 06, 1919 DOA: 06/15/2020 PCP: Kari Baars, MD   Brief HPI:   86 year old female with history of osteoporosis, hypertension, glaucoma, GERD, anxiety came to ED with complaints of hip pain. Patient demented does not provide any history. Patient reportedly fell on 06/14/2020 night. Patient had ambulatory dysfunction after that. In the ED imaging studies showed pubic rami fracture on the left.    Subjective   Patient seen and examined, she is more alert however continues to be confused.  Urine culture growing gram-negative rods.  Patient is already on ceftriaxone.   Assessment/Plan:     1. Left pubic rami fracture-pain control with morphine as needed, weightbearing as tolerated.  PT consulted. 2. UTI-patient has abnormal UA, urine culture obtained yesterday.  WBC is down to 10,000.  She is afebrile.  Her mental status changes may be due to underlying UTI.  Patient started on ceftriaxone, urine culture growing gram-negative rods.  Will start IV ceftriaxone 1 g every 24 hours.  Follow urine culture results. 3. Altered mental status-likely has underlying dementia, exacerbated by opiates given during hospitalization for pubic rami fracture along with UTI.  Dose of morphine has been reduced to 1 mg every 3 hours as needed.  Started on IV antibiotics as above.  Follow urine culture results. 4. Hypertension-continue hydralazine, will hold Lasix due to development of AKI. 5. Acute kidney injury-patient's creatinine went from 1.06-1.53, likely poor p.o. intake/dehydration.  Patient was somnolent, did not drink much fluids.  Patient was started on normal saline at 75 mill per hour.  Will increase rate of normal saline to 100 mL/h.  Follow BMP in am.       COVID-19 Labs  No results for input(s): DDIMER, FERRITIN, LDH, CRP in the last 72 hours.  Lab Results  Component Value Date   SARSCOV2NAA NEGATIVE 06/16/2020      Scheduled medications:   . cholecalciferol  500 Units Oral BID  . famotidine  20 mg Oral Daily  . heparin  5,000 Units Subcutaneous Q8H  . hydrALAZINE  10 mg Oral BID  . multivitamins with iron  1 tablet Oral Daily  . nebivolol  10 mg Oral BID    SpO2: (!) 89 % O2 Flow Rate (L/min): 2 L/min    CBC: Recent Labs  Lab 06/16/20 0002 06/17/20 0413  WBC 16.0* 10.1  NEUTROABS 13.7*  --   HGB 12.5 11.1*  HCT 38.8 35.8*  MCV 96.0 99.2  PLT 215 169    Basic Metabolic Panel: Recent Labs  Lab 06/16/20 0002 06/17/20 0413 06/18/20 0353  NA 140 142 145  K 3.6 4.0 4.6  CL 95* 103 107  CO2 29 30 28   GLUCOSE 146* 89 103*  BUN 23 33* 40*  CREATININE 1.06* 1.47* 1.53*  CALCIUM 9.6 8.3* 8.5*     Liver Function Tests: Recent Labs  Lab 06/16/20 0002  AST 30  ALT 21  ALKPHOS 81  BILITOT 0.9  PROT 7.4  ALBUMIN 3.9     Antibiotics: Anti-infectives (From admission, onward)   Start     Dose/Rate Route Frequency Ordered Stop   06/17/20 0930  cefTRIAXone (ROCEPHIN) 1 g in sodium chloride 0.9 % 100 mL IVPB        1 g 200 mL/hr over 30 Minutes Intravenous Every 24 hours 06/17/20 0843         DVT prophylaxis: Heparin  Code Status: DNR  Family Communication: Discussed with patient's daughter-in-law at bedside  on 06/16/2020    Status is: Inpatient  Dispo: The patient is from: Skilled nursing facility         Anticipated d/c is to: Skilled nursing facility              Anticipated d/c date is: 06/18/2020              Patient currently not medically stable for discharge  Barrier to discharge-ongoing treatment for UTI, pain control for pelvic fracture          Consultants:    Procedures:     Objective   Vitals:   06/17/20 0554 06/17/20 1454 06/17/20 2327 06/18/20 0516  BP: 100/61 109/71 (!) 157/82 (!) 143/82  Pulse: (!) 59 69 72 76  Resp: 14 18 16 16   Temp: 98 F (36.7 C) (!) 97.5 F (36.4 C) 98 F (36.7 C) 98.8 F (37.1 C)  TempSrc: Oral      SpO2: 96% 94% 91% (!) 89%  Weight:      Height:        Intake/Output Summary (Last 24 hours) at 06/18/2020 0843 Last data filed at 06/18/2020 0516 Gross per 24 hour  Intake 1924.47 ml  Output 300 ml  Net 1624.47 ml    10/16 1901 - 10/18 0700 In: 2525 [P.O.:80; I.V.:2345] Out: 500 [Urine:500]  Filed Weights   06/15/20 2129  Weight: 72.6 kg    Physical Examination:    General-appears in no acute distress  Heart-S1-S2, regular, no murmur auscultated  Lungs-clear to auscultation bilaterally, no wheezing or crackles auscultated  Abdomen-soft, nontender, no organomegaly  Extremities-no edema in the lower extremities  Neuro-alert, pleasantly confused    Data Reviewed:   Recent Results (from the past 240 hour(s))  Respiratory Panel by RT PCR (Flu A&B, Covid) - Nasopharyngeal Swab     Status: None   Collection Time: 06/16/20 12:15 AM   Specimen: Nasopharyngeal Swab  Result Value Ref Range Status   SARS Coronavirus 2 by RT PCR NEGATIVE NEGATIVE Final    Comment: (NOTE) SARS-CoV-2 target nucleic acids are NOT DETECTED.  The SARS-CoV-2 RNA is generally detectable in upper respiratoy specimens during the acute phase of infection. The lowest concentration of SARS-CoV-2 viral copies this assay can detect is 131 copies/mL. A negative result does not preclude SARS-Cov-2 infection and should not be used as the sole basis for treatment or other patient management decisions. A negative result may occur with  improper specimen collection/handling, submission of specimen other than nasopharyngeal swab, presence of viral mutation(s) within the areas targeted by this assay, and inadequate number of viral copies (<131 copies/mL). A negative result must be combined with clinical observations, patient history, and epidemiological information. The expected result is Negative.  Fact Sheet for Patients:  06/18/20  Fact Sheet for Healthcare  Providers:  https://www.moore.com/  This test is no t yet approved or cleared by the https://www.young.biz/ FDA and  has been authorized for detection and/or diagnosis of SARS-CoV-2 by FDA under an Emergency Use Authorization (EUA). This EUA will remain  in effect (meaning this test can be used) for the duration of the COVID-19 declaration under Section 564(b)(1) of the Act, 21 U.S.C. section 360bbb-3(b)(1), unless the authorization is terminated or revoked sooner.     Influenza A by PCR NEGATIVE NEGATIVE Final   Influenza B by PCR NEGATIVE NEGATIVE Final    Comment: (NOTE) The Xpert Xpress SARS-CoV-2/FLU/RSV assay is intended as an aid in  the diagnosis of influenza from Nasopharyngeal swab specimens  and  should not be used as a sole basis for treatment. Nasal washings and  aspirates are unacceptable for Xpert Xpress SARS-CoV-2/FLU/RSV  testing.  Fact Sheet for Patients: https://www.moore.com/  Fact Sheet for Healthcare Providers: https://www.young.biz/  This test is not yet approved or cleared by the [place] FDA and  has been authorized for detection and/or diagnosis of SARS-CoV-2 by  FDA under an Emergency Use Authorization (EUA). This EUA will remain  in effect (meaning this test can be used) for the duration of the  Covid-19 declaration under Section 564(b)(1) of the Act, 21  U.S.C. section 360bbb-3(b)(1), unless the authorization is  terminated or revoked. Performed at [facility], [address] [place], [place] [address]   Culture, Urine     Status: Abnormal (Preliminary result)   Collection Time: 06/16/20  3:30 PM   Specimen: Urine, Catheterized  Result Value Ref Range Status   Specimen Description   Final    URINE, CATHETERIZED Performed at [facility], [address], [place], [place] [address]    Special Requests   Final    URINE, RANDOM Performed at [facility],  [address], [place], [place] [address]    Culture (A)  Final    >=100,000 COLONIES/mL GRAM NEGATIVE RODS SUSCEPTIBILITIES TO FOLLOW Performed at [facility], [address], [place], [place] [address]    Report Status PENDING  Incomplete    No results for input(s): LIPASE, AMYLASE in the last 168 hours. No results for input(s): AMMONIA in the last 168 hours.  Cardiac Enzymes: No results for input(s): CKTOTAL, CKMB, CKMBINDEX, TROPONINI in the last 168 hours. BNP (last 3 results) No results for input(s): BNP in the last 8760 hours.  ProBNP (last 3 results) No results for input(s): PROBNP in the last 8760 hours.  Studies:  No results found.     Meredeth Ide   [facility] If 7PM-7AM, please contact night-coverage at [email or web address], Office  [phone]   06/18/2020, 8:43 AM  LOS: 1 day

## 2020-06-19 DIAGNOSIS — Z515 Encounter for palliative care: Secondary | ICD-10-CM

## 2020-06-19 DIAGNOSIS — Z7189 Other specified counseling: Secondary | ICD-10-CM

## 2020-06-19 LAB — BASIC METABOLIC PANEL
Anion gap: 9 (ref 5–15)
BUN: 30 mg/dL — ABNORMAL HIGH (ref 8–23)
CO2: 27 mmol/L (ref 22–32)
Calcium: 8.6 mg/dL — ABNORMAL LOW (ref 8.9–10.3)
Chloride: 111 mmol/L (ref 98–111)
Creatinine, Ser: 0.97 mg/dL (ref 0.44–1.00)
GFR, Estimated: 48 mL/min — ABNORMAL LOW (ref >60)
Glucose, Bld: 110 mg/dL — ABNORMAL HIGH (ref 70–99)
Potassium: 3.6 mmol/L (ref 3.5–5.1)
Sodium: 147 mmol/L — ABNORMAL HIGH (ref 135–145)

## 2020-06-19 LAB — URINE CULTURE: Culture: >=100000 — AB

## 2020-06-19 MED ORDER — ACETAMINOPHEN 160 MG/5ML PO SOLN
650.0000 mg | Freq: Four times a day (QID) | ORAL | Status: DC
  Administered 2020-06-19 – 2020-06-21 (×4): 650 mg via ORAL
  Filled 2020-06-19 (×6): qty 20.3

## 2020-06-19 MED ORDER — FENTANYL CITRATE (PF) 100 MCG/2ML IJ SOLN: 12.5000 ug | INTRAMUSCULAR | Status: DC | PRN

## 2020-06-19 MED ORDER — CEPHALEXIN 500 MG PO CAPS
500.0000 mg | ORAL_CAPSULE | Freq: Two times a day (BID) | ORAL | Status: AC
  Administered 2020-06-20 – 2020-06-21 (×4): 500 mg via ORAL
  Filled 2020-06-19 (×4): qty 1

## 2020-06-19 NOTE — Plan of Care (Signed)

## 2020-06-19 NOTE — Progress Notes (Signed)
[facility]  PROGRESS NOTE  Felicia Wells ASN:[id number] DOB: 1919-05-11 DOA: 06/15/2020 PCP: Kari Baars, MD   Brief HPI:   86 year old female with history of osteoporosis, hypertension, glaucoma, GERD, anxiety came to ED with complaints of hip pain. Patient demented does not provide any history. Patient reportedly fell on 06/14/2020 night. Patient had ambulatory dysfunction after that. In the ED imaging studies showed pubic rami fracture on the left.    Subjective   Patient seen and examined, she is alert however continues to be confused.  P.o. intake is adequate.   Assessment/Plan:     1. Left pubic rami fracture-pain control with morphine as needed, weightbearing as tolerated.  PT consulted. 2. UTI-patient has abnormal UA, urine culture obtained yesterday.  WBC is down to 10,000.  She is afebrile.  Her mental status changes may be due to underlying UTI.  Patient started on ceftriaxone, urine culture growing E. Coli, sensitive to cefazolin.  Patient started on Keflex. 3. Altered mental status-slightly improved, likely has underlying dementia, exacerbated by opiates given during hospitalization for pubic rami fracture along with UTI.  Dose of morphine has been reduced to 1 mg every 3 hours as needed.  Started on antibiotics as above.  4. Hypertension-continue hydralazine, will hold Lasix due to development of AKI. 5. Acute kidney injury-patient's creatinine went from 1.06-1.53, likely poor p.o. intake/dehydration.  Patient was somnolent, did not drink much fluids.  Normal saline was changed to 100 mL/h, will check BMP today.   6. Goals of care-discussed with patient's daughter regarding poor prognosis considering worsening renal function, UTI, pelvic fracture.  She is agreeable for palliative care consultation.  Will consult palliative care for goals of care discussion.     COVID-19 Labs  No results for input(s): DDIMER, FERRITIN, LDH, CRP in the last 72 hours.  Lab  Results  Component Value Date   SARSCOV2NAA NEGATIVE 06/16/2020     Scheduled medications:   . [START ON 06/20/2020] cephALEXin  500 mg Oral Q12H  . cholecalciferol  500 Units Oral BID  . famotidine  20 mg Oral Daily  . heparin  5,000 Units Subcutaneous Q8H  . hydrALAZINE  10 mg Oral BID  . multivitamins with iron  1 tablet Oral Daily  . nebivolol  10 mg Oral BID    SpO2: 90 % O2 Flow Rate (L/min): 2 L/min    CBC: Recent Labs  Lab 06/16/20 0002 06/17/20 0413  WBC 16.0* 10.1  NEUTROABS 13.7*  --   HGB 12.5 11.1*  HCT 38.8 35.8*  MCV 96.0 99.2  PLT 215 169    Basic Metabolic Panel: Recent Labs  Lab 06/16/20 0002 06/17/20 0413 06/18/20 0353  NA 140 142 145  K 3.6 4.0 4.6  CL 95* 103 107  CO2 29 30 28   GLUCOSE 146* 89 103*  BUN 23 33* 40*  CREATININE 1.06* 1.47* 1.53*  CALCIUM 9.6 8.3* 8.5*     Liver Function Tests: Recent Labs  Lab 06/16/20 0002  AST 30  ALT 21  ALKPHOS 81  BILITOT 0.9  PROT 7.4  ALBUMIN 3.9     Antibiotics: Anti-infectives (From admission, onward)   Start     Dose/Rate Route Frequency Ordered Stop   06/20/20 1000  cephALEXin (KEFLEX) capsule 500 mg        500 mg Oral Every 12 hours 06/19/20 1319 06/22/20 0959   06/17/20 0930  cefTRIAXone (ROCEPHIN) 1 g in sodium chloride 0.9 % 100 mL IVPB  Status:  Discontinued  1 g 200 mL/hr over 30 Minutes Intravenous Every 24 hours 06/17/20 0843 06/19/20 1319       DVT prophylaxis: Heparin  Code Status: DNR  Family Communication: Discussed with patient's daughter-in-law at bedside on 06/16/2020    Status is: Inpatient  Dispo: The patient is from: Skilled nursing facility         Anticipated d/c is to: Skilled nursing facility              Anticipated d/c date is: 06/21/2020              Patient currently not medically stable for discharge  Barrier to discharge-ongoing treatment for UTI, pain control for pelvic fracture, palliative care discussion        Consultants:    Procedures:     Objective   Vitals:   06/18/20 0516 06/18/20 1307 06/18/20 2119 06/19/20 0628  BP: (!) 143/82 (!) 145/80 110/84 (!) 162/103  Pulse: 76 88 99 61  Resp: 16 17 19 18   Temp: 98.8 F (37.1 C) 98.1 F (36.7 C) 98.7 F (37.1 C) 97.6 F (36.4 C)  TempSrc:  Axillary Oral Oral  SpO2: (!) 89% 96% 94% 90%  Weight:      Height:        Intake/Output Summary (Last 24 hours) at 06/19/2020 1353 Last data filed at 06/19/2020 1300 Gross per 24 hour  Intake 1770.66 ml  Output 150 ml  Net 1620.66 ml    10/17 1901 - 10/19 0700 In: 2128 [P.O.:90; I.V.:1938] Out: 200 [Urine:200]  Filed Weights   06/15/20 2129  Weight: 72.6 kg    Physical Examination:    General-appears in no acute distress  Heart-S1-S2, regular, no murmur auscultated  Lungs-clear to auscultation bilaterally, no wheezing or crackles auscultated  Abdomen-soft, nontender, no organomegaly  Extremities-no edema in the lower extremities  Neuro-alert, pleasantly confused    Data Reviewed:   Recent Results (from the past 240 hour(s))  Respiratory Panel by RT PCR (Flu A&B, Covid) - Nasopharyngeal Swab     Status: None   Collection Time: 06/16/20 12:15 AM   Specimen: Nasopharyngeal Swab  Result Value Ref Range Status   SARS Coronavirus 2 by RT PCR NEGATIVE NEGATIVE Final    Comment: (NOTE) SARS-CoV-2 target nucleic acids are NOT DETECTED.  The SARS-CoV-2 RNA is generally detectable in upper respiratoy specimens during the acute phase of infection. The lowest concentration of SARS-CoV-2 viral copies this assay can detect is 131 copies/mL. A negative result does not preclude SARS-Cov-2 infection and should not be used as the sole basis for treatment or other patient management decisions. A negative result may occur with  improper specimen collection/handling, submission of specimen other than nasopharyngeal swab, presence of viral mutation(s) within the areas  targeted by this assay, and inadequate number of viral copies (<131 copies/mL). A negative result must be combined with clinical observations, patient history, and epidemiological information. The expected result is Negative.  Fact Sheet for Patients:  06/18/20  Fact Sheet for Healthcare Providers:  https://www.moore.com/  This test is no t yet approved or cleared by the https://www.young.biz/ FDA and  has been authorized for detection and/or diagnosis of SARS-CoV-2 by FDA under an Emergency Use Authorization (EUA). This EUA will remain  in effect (meaning this test can be used) for the duration of the COVID-19 declaration under Section 564(b)(1) of the Act, 21 U.S.C. section 360bbb-3(b)(1), unless the authorization is terminated or revoked sooner.     Influenza A by PCR NEGATIVE  NEGATIVE Final   Influenza B by PCR NEGATIVE NEGATIVE Final    Comment: (NOTE) The Xpert Xpress SARS-CoV-2/FLU/RSV assay is intended as an aid in  the diagnosis of influenza from Nasopharyngeal swab specimens and  should not be used as a sole basis for treatment. Nasal washings and  aspirates are unacceptable for Xpert Xpress SARS-CoV-2/FLU/RSV  testing.  Fact Sheet for Patients: https://www.moore.com/  Fact Sheet for Healthcare Providers: https://www.young.biz/  This test is not yet approved or cleared by the [place] FDA and  has been authorized for detection and/or diagnosis of SARS-CoV-2 by  FDA under an Emergency Use Authorization (EUA). This EUA will remain  in effect (meaning this test can be used) for the duration of the  Covid-19 declaration under Section 564(b)(1) of the Act, 21  U.S.C. section 360bbb-3(b)(1), unless the authorization is  terminated or revoked. Performed at [facility], [address], [place], [place] [address]   Culture, Urine     Status: Abnormal   Collection Time: 06/16/20  3:30 PM    Specimen: Urine, Catheterized  Result Value Ref Range Status   Specimen Description   Final    URINE, CATHETERIZED Performed at [facility], [address] [place], [place] [address]    Special Requests   Final    URINE, RANDOM Performed at [facility], [address] [place], [place] [address]    Culture >=100,000 COLONIES/mL ESCHERICHIA COLI (A)  Final   Report Status 06/19/2020 FINAL  Final   Organism ID, Bacteria ESCHERICHIA COLI (A)  Final      Susceptibility   Escherichia coli - MIC*    AMPICILLIN >=32 RESISTANT Resistant     CEFAZOLIN <=4 SENSITIVE Sensitive     CEFTRIAXONE <=0.25 SENSITIVE Sensitive     CIPROFLOXACIN >=4 RESISTANT Resistant     GENTAMICIN <=1 SENSITIVE Sensitive     IMIPENEM <=0.25 SENSITIVE Sensitive     NITROFURANTOIN 32 SENSITIVE Sensitive     TRIMETH/SULFA <=20 SENSITIVE Sensitive     AMPICILLIN/SULBACTAM 4 SENSITIVE Sensitive     PIP/TAZO <=4 SENSITIVE Sensitive     * >=100,000 COLONIES/mL ESCHERICHIA COLI        Khalib Fendley S Raider Valbuena   [facility] If 7PM-7AM, please contact night-coverage at [email or web address], Office  [phone]   06/19/2020, 1:53 PM  LOS: 2 days

## 2020-06-19 NOTE — [place] FL2 (Signed)
[place] MEDICAID FL2 LEVEL OF CARE SCREENING TOOL     IDENTIFICATION  Patient Name: [place] Birthdate: 1919/07/10 Sex: female Admission Date (Current Location): 06/15/2020  [facility] and [place] Number:  Producer, television/film/video and Address:  [facility],  [address] [place], [place] [address]      Provider Number: [id number]  Attending Physician Name and Address:  Meredeth Ide, MD  Relative Name and Phone Number:  son, Myrian Botello @ [phone]    Current Level of Care: Hospital Recommended Level of Care: Skilled Nursing Facility Prior Approval Number:    Date Approved/Denied:   PASRR Number: [id number]  Discharge Plan: SNF    Current Diagnoses: Patient Active Problem List   Diagnosis Date Noted  . UTI (urinary tract infection) 06/17/2020  . Closed fracture of multiple pubic rami, left, initial encounter (HCC) 06/15/2020  . Dementia with behavioral disturbance (HCC) 04/11/2016  . Lower GI bleed 04/09/2016  . Diverticulosis large intestine w/o perforation or abscess w/bleeding   . Palliative care encounter   . Hematochezia 09/12/2015  . DNR (do not resuscitate) 09/12/2015  . Bleeding gastrointestinal   . CAP (community acquired pneumonia) 12/04/2012  . Hyponatremia 12/04/2012  . Muscular deconditioning 12/04/2012  . Acute delirium 12/04/2012  . Fracture of acetabulum (HCC) 07/19/2012  . Essential hypertension 07/19/2012  . Meniere's disease 07/19/2012  . Muscle weakness (generalized) 06/03/2011  . Difficulty in walking(719.7) 06/03/2011    Orientation RESPIRATION BLADDER Height & Weight        Normal Incontinent Weight: 160 lb (72.6 kg) Height:  5\' 6"  (167.6 cm)  BEHAVIORAL SYMPTOMS/MOOD NEUROLOGICAL BOWEL NUTRITION STATUS      Incontinent    AMBULATORY STATUS COMMUNICATION OF NEEDS Skin   Extensive Assist Does not communicate Normal                       Personal Care Assistance Level of Assistance  Bathing, Dressing, Feeding  Bathing Assistance: Maximum assistance Feeding assistance: Limited assistance Dressing Assistance: Maximum assistance     Functional Limitations Info             SPECIAL CARE FACTORS FREQUENCY  PT (By licensed PT), OT (By licensed OT)     PT Frequency: 5x/wk OT Frequency: 5x/wk            Contractures Contractures Info: Not present    Additional Factors Info  Code Status, Allergies Code Status Info: DNR Allergies Info: see MAR           Current Medications (06/19/2020):  This is the current hospital active medication list Current Facility-Administered Medications  Medication Dose Route Frequency Provider Last Rate Last Admin  . 0.9 %  sodium chloride infusion   Intravenous Continuous 06/21/2020, MD 100 mL/hr at 06/19/20 0646 New Bag at 06/19/20 0646  . acetaminophen (TYLENOL) tablet 650 mg  650 mg Oral Q6H PRN Zierle-Ghosh, Asia B, DO       Or  . acetaminophen (TYLENOL) suppository 650 mg  650 mg Rectal Q6H PRN Zierle-Ghosh, Asia B, DO      . cefTRIAXone (ROCEPHIN) 1 g in sodium chloride 0.9 % 100 mL IVPB  1 g Intravenous Q24H 06/21/20, MD 200 mL/hr at 06/18/20 0824 1 g at 06/18/20 0824  . cholecalciferol (VITAMIN D3) tablet 500 Units  500 Units Oral BID Zierle-Ghosh, Asia B, DO      . famotidine (PEPCID) tablet 20 mg  20 mg Oral Daily Zierle-Ghosh, 06/20/20  B, DO      . heparin injection 5,000 Units  5,000 Units Subcutaneous Q8H Zierle-Ghosh, Asia B, DO   5,000 Units at 06/19/20 0542  . hydrALAZINE (APRESOLINE) tablet 10 mg  10 mg Oral BID Zierle-Ghosh, Asia B, DO      . LORazepam (ATIVAN) tablet 0.5 mg  0.5 mg Oral Q4H PRN Meredeth Ide, MD   0.5 mg at 06/18/20 1435   Or  . LORazepam (ATIVAN) injection 0.5 mg  0.5 mg Intramuscular Q4H PRN Meredeth Ide, MD   0.5 mg at 06/19/20 0150  . morphine 2 MG/ML injection 1 mg  1 mg Intravenous Q3H PRN Meredeth Ide, MD   1 mg at 06/19/20 0539  . multivitamins with iron tablet 1 tablet  1 tablet Oral Daily Zierle-Ghosh,  Asia B, DO      . nebivolol (BYSTOLIC) tablet 10 mg  10 mg Oral BID Zierle-Ghosh, Asia B, DO      . ondansetron (ZOFRAN) tablet 4 mg  4 mg Oral Q6H PRN Zierle-Ghosh, Asia B, DO       Or  . ondansetron (ZOFRAN) injection 4 mg  4 mg Intravenous Q6H PRN Zierle-Ghosh, Asia B, DO   4 mg at 06/16/20 0135  . oxyCODONE (Oxy IR/ROXICODONE) immediate release tablet 5 mg  5 mg Oral Q4H PRN Zierle-Ghosh, Asia B, DO      . traZODone (DESYREL) tablet 25 mg  25 mg Oral QHS PRN Zierle-Ghosh, Asia B, DO         Discharge Medications: Please see discharge summary for a list of discharge medications.  Relevant Imaging Results:  Relevant Lab Results:   Additional Information SS# [id number]  Amada Jupiter, LCSW

## 2020-06-19 NOTE — Consult Note (Signed)
Consultation Note Date: 06/19/2020   Patient Name: Felicia GasserVirginia P Wells  DOB: Sep 12, 1918  MRN: [id number]  Age / Sex: 86 y.o., female  PCP: Kari BaarsHawkins, Edward, MD Referring Physician: Meredeth IdeLama, Gagan S, MD  Reason for Consultation: Establishing goals of care  HPI/Patient Profile: 86 y.o. female  with past medical history of osteoporosis, hypertension, glaucoma, GERD, anxiety, resident of assisted living facility admitted on 06/15/2020 with hip pain and changes in mental status who was subsequently found to have pubic rami fracture.  She is also currently being treated for UTI and has been noted to have altered mental status as well as AKI.  Palliative consulted for goals of care.  Clinical Assessment and Goals of Care: Negative care consult received.  Chart reviewed including personal review of pertinent labs and imaging.  I saw and examined Ms. Felicia Wells today.  She was somnolent throughout encounter and did not respond to verbal or tactile stimulation.  Bedside care team reports that she is either somnolent or agitated.  I called and left a message for patient's son, Felicia Wells.  I then called and was able to reach her daughter, Felicia Wells.  Felicia Wells and I discussed the continued decline that family has noted cognition, and functional status with her dementia has progressed.  Facility and has had significant decline lately.  She has had 2 recent falls.  We discussed clinical course as well as wishes moving forward in regard to advanced directives.  Concepts specific to code status and care plan this hospitalization discussed.  We discussed difference between a aggressive medical intervention path and a palliative, comfort focused care path.  Values and goals of care important to patient and family were attempted to be elicited.  Concept of Hospice and Palliative Care were briefly discussed.  Felicia Wells reports understanding that her  mother is critically ill and may not recover from current illness.  If it appears that this will be terminal event, family is open to consideration for focus on comfort and dignity with consideration for hospice services.  We discussed plan for adjusting medications to see if this improves her mental status and reassessing her situation again tomorrow.  Questions and concerns addressed.   PMT will continue to support holistically.   SUMMARY OF RECOMMENDATIONS   -DNR/DNI - Pain: Reported to either be agitated or somnolent.  We will plan for addition of scheduled Tylenol 4 times daily to see if this better controls her pain while allowing her to utilize less narcotics.  Also, with her current renal function, will plan to transition from IV morphine to as needed fentanyl as this would be more appropriate choice with her renal impairment and also a shorter acting so may be less likely to add to somnolence. - Lethargy: plan to avoid potentially sedating medications as much as possible.   - Continue current interventions and plan to reassess her situation again tomorrow.   -Follow-up meeting with daughter scheduled for 11:30 AM tomorrow  Code Status/Advance Care Planning:  DNR   Symptom Management:   As above  Palliative Prophylaxis:   Bowel Regimen, Delirium Protocol and Frequent Pain Assessment  Psycho-social/Spiritual:   Desire for further Chaplaincy support:no  Additional Recommendations: Education on Hospice  Prognosis:   Guarded  Discharge Planning: To Be Determined      Primary Diagnoses: Present on Admission: . Closed fracture of multiple pubic rami, left, initial encounter (HCC) . UTI (urinary tract infection)   I have reviewed the medical record, interviewed the patient and family, and examined the patient. The following aspects are pertinent.  Past Medical History:  Diagnosis Date  . Anxiety   . Anxiety   . Eye problem   . GERD (gastroesophageal reflux  disease)   . GI hemorrhage   . Glaucoma   . Hypertension   . Infections of kidney   . Osteoporosis    Social History   Socioeconomic History  . Marital status: Married    Spouse name: Not on file  . Number of children: Not on file  . Years of education: Not on file  . Highest education level: Not on file  Occupational History  . Not on file  Tobacco Use  . Smoking status: Never Smoker  . Smokeless tobacco: Never Used  Substance and Sexual Activity  . Alcohol use: No  . Drug use: No  . Sexual activity: Never  Other Topics Concern  . Not on file  Social History Narrative  . Not on file   Social Determinants of Health   Financial Resource Strain:   . Difficulty of Paying Living Expenses: Not on file  Food Insecurity:   . Worried About Programme researcher, broadcasting/film/video in the Last Year: Not on file  . Ran Out of Food in the Last Year: Not on file  Transportation Needs:   . Lack of Transportation (Medical): Not on file  . Lack of Transportation (Non-Medical): Not on file  Physical Activity:   . Days of Exercise per Week: Not on file  . Minutes of Exercise per Session: Not on file  Stress:   . Feeling of Stress : Not on file  Social Connections:   . Frequency of Communication with Friends and Family: Not on file  . Frequency of Social Gatherings with Friends and Family: Not on file  . Attends Religious Services: Not on file  . Active Member of Clubs or Organizations: Not on file  . Attends Banker Meetings: Not on file  . Marital Status: Not on file   History reviewed. No pertinent family history. Scheduled Meds: . acetaminophen (TYLENOL) oral liquid 160 mg/5 mL  650 mg Oral Q6H  . [START ON 06/20/2020] cephALEXin  500 mg Oral Q12H  . cholecalciferol  500 Units Oral BID  . famotidine  20 mg Oral Daily  . heparin  5,000 Units Subcutaneous Q8H  . hydrALAZINE  10 mg Oral BID  . multivitamins with iron  1 tablet Oral Daily  . nebivolol  10 mg Oral BID   Continuous  Infusions: . sodium chloride 100 mL/hr at 06/19/20 0646   PRN Meds:.fentaNYL (SUBLIMAZE) injection, LORazepam **OR** LORazepam, ondansetron **OR** ondansetron (ZOFRAN) IV, oxyCODONE, traZODone Medications Prior to Admission:  Prior to Admission medications   Medication Sig Start Date End Date Taking? Authorizing Provider  Artificial Tear Solution (SOOTHE XP) SOLN Place 1 drop into both eyes 4 (four) times daily.   Yes [provider]  cholecalciferol (VITAMIN D) 1000 UNITS tablet Take 500 Units by mouth daily.    Yes [provider]  CRANBERRY PO Take  1 tablet by mouth daily.   Yes [provider]  Ensure (ENSURE) Take 237 mLs by mouth 4 (four) times daily.   Yes [provider]  famotidine (PEPCID) 20 MG tablet Take 1 tablet (20 mg total) by mouth 2 (two) times daily. 09/14/15  Yes Avon Gully, MD  furosemide (LASIX) 20 MG tablet Take 20 mg by mouth 2 (two) times daily.    Yes [provider]  hydrALAZINE (APRESOLINE) 10 MG tablet Take 10 mg by mouth 2 (two) times daily.   Yes [provider]  mirtazapine (REMERON) 7.5 MG tablet Take 7.5 mg by mouth every evening.  06/12/20  Yes [provider]  Multiple Vitamins-Iron (MULTIVITAMINS WITH IRON) TABS tablet Take 1 tablet by mouth daily.   Yes [provider]  multivitamin-lutein (OCUVITE-LUTEIN) CAPS capsule Take 1 capsule by mouth daily.   Yes [provider]  nebivolol (BYSTOLIC) 10 MG tablet Take 10 mg by mouth daily.   Yes [provider]  omega-3 acid ethyl esters (LOVAZA) 1 g capsule Take 2 g by mouth daily.   Yes [provider]  potassium chloride (K-DUR) 10 MEQ tablet Take 1 tablet (10 mEq total) by mouth daily. 12/04/12  Yes Kari Baars, MD  sertraline (ZOLOFT) 25 MG tablet Take 25 mg by mouth daily. 06/07/20  Yes [provider]  telmisartan (MICARDIS) 20 MG tablet Take 20 mg by mouth daily.   Yes [provider]    timolol (TIMOPTIC) 0.5 % ophthalmic solution Place 1 drop into both eyes daily.  05/16/20  Yes [provider]  meclizine (ANTIVERT) 25 MG tablet Take 25 mg by mouth 3 (three) times daily as needed for dizziness.    [provider]   Allergies  Allergen Reactions  . Alendronate Other (See Comments)    Alendronic Acid Per MAR Reaction:  Cramping   . Butazolidin [Phenylbutazone] Swelling  . Codeine Nausea And Vomiting  . Darvocet [Propoxyphene N-Acetaminophen] Nausea And Vomiting  . Ezetimibe-Simvastatin Other (See Comments)    Reaction:  Numbness   . Meperidine Nausea And Vomiting  . Morphine And Related Other (See Comments)    Reaction:  Unknown   . Niacin Nausea And Vomiting and Other (See Comments)    Reaction:  Bloating   . Oxybutynin Chloride Other (See Comments)    Reaction:  Confusion   . Prednisone Other (See Comments)    Reaction:  Confusion   . Propoxyphene Nausea And Vomiting  . Raloxifene Other (See Comments)    Reaction:  Cramping   . Risedronate Other (See Comments)    Reaction:  Dizziness   . Simvastatin Other (See Comments)    Reaction:  Bloating   . Triamterene Nausea And Vomiting  . Diltiazem Hcl Rash  . Lansoprazole Rash  . Pravastatin Rash  . Sulfa Antibiotics Rash  . Teriparatide Palpitations   Review of Systems Unable to obtain  Physical Exam  General: Somnolent, critically ill-appearing HEENT: No bruits, no goiter, no JVD Heart: Regular rate and rhythm. No murmur appreciated. Lungs: Good air movement, clear Abdomen: Soft, nontender, nondistended, positive bowel sounds.  Ext: No significant edema Skin: Warm and dry   Vital Signs: BP (!) 160/85   Pulse 76   Temp 98.2 F (36.8 C)   Resp 14   Ht 5\' 6"  (1.676 m)   Wt 72.6 kg   SpO2 96%   BMI 25.82 kg/m  Pain Scale: 0-10 POSS *See Group Information*: S-Acceptable,Sleep, easy to arouse Pain Score:  Asleep   SpO2: SpO2: 96 % O2 Device:SpO2: 96 % O2 Flow Rate: .O2 Flow  Rate (L/min): 2 L/min  IO: Intake/output summary:   Intake/Output Summary (Last 24 hours) at 06/19/2020 2150 Last data filed at 06/19/2020 1812 Gross per 24 hour  Intake 1850.66 ml  Output 300 ml  Net 1550.66 ml    LBM: Last BM Date:  (unknown) Baseline Weight: Weight: 72.6 kg Most recent weight: Weight: 72.6 kg     Palliative Assessment/Data:   Flowsheet Rows     Most Recent Value  Intake Tab  Referral Department Hospitalist  Unit at Time of Referral Med/Surg Unit  Palliative Care Primary Diagnosis Sepsis/Infectious Disease  Date Notified 06/19/20  Palliative Care Type Return patient Palliative Care  Reason for referral Clarify Goals of Care  Date of Admission 06/15/20  Date first seen by Palliative Care 06/19/20  # of days Palliative referral response time 0 Day(s)  # of days IP prior to Palliative referral 4  Clinical Assessment  Palliative Performance Scale Score 10%  Psychosocial & Spiritual Assessment  Palliative Care Outcomes  Patient/Family meeting held? No      Time In: 1420 Time Out: 1540 Time Total: 80 Greater than 50%  of this time was spent counseling and coordinating care related to the above assessment and plan.  Signed by: Romie Minus, MD   Please contact Palliative Medicine Team phone at [phone] for questions and concerns.  For individual provider: See Loretha Stapler

## 2020-06-20 NOTE — Care Management Important Message (Signed)
Important Message  Patient Details IM Letter given to the Patient Name: Felicia Wells MRN: [id number] Date of Birth: 18-Dec-1918   Medicare Important Message Given:  Yes     Caren Macadam 06/20/2020, 11:08 AM

## 2020-06-20 NOTE — Progress Notes (Addendum)
PROGRESS NOTE    [place]  JJO:[id number] DOB: [birth year] DOA: 06/15/2020 PCP: Kari Baars, MD  Brief Narrative:86 year old female with history of osteoporosis, hypertension, glaucoma, GERD, anxiety came to ED with complaints of hip pain. Patient demented does not provide any history. Patient reportedly fell on 06/14/2020 night. Patient had ambulatory dysfunction after that. In the ED imaging studies showed pubic rami fracture on the left.  Assessment & Plan:   Active Problems:   Closed fracture of multiple pubic rami, left, initial encounter (HCC)   UTI (urinary tract infection)    #1 left pubic Ramey fracture treat with Tylenol minimize narcotics.  #2 UTI E. coli sensitive to cefazolin on Keflex  #3 worsening mental status in the setting of dementia likely due to UTI recent hospitalization and recent fracture.  #4 history of hypertension on hydralazine.  Lasix on hold due to AKI.  #5 goals of care family considering sending patient back to memory care with hospice.  Awaiting final decision and waiting to hear back from memory care and PT evaluation.  #6 AKI resolved.  Due to decreased p.o. intake and dehydration on slow IV fluids.      Nutrition Problem: Increased nutrient needs Etiology: hip fracture     Signs/Symptoms: estimated needs    Interventions: Ensure Enlive (each supplement provides 350kcal and 20 grams of protein), MVI, Liberalize Diet  Estimated body mass index is 25.82 kg/m as calculated from the following:   Height as of this encounter: 5\' 6"  (1.676 m).   Weight as of this encounter: 72.6 kg.  DVT prophylaxis: Heparin Code Status: DNR  family Communication: None Disposition Plan:  Status is: Inpatient Dispo: The patient is from: SNF              Anticipated d/c is to: SNF              Anticipated d/c date is: 1 day              Patient currently is medically stable to d/c.   Consultants: Palliative care  Procedures:  None Antimicrobials: Keflex  Subjective:  Patient resting in bed appears to be confused but more awake Objective: Vitals:   06/20/20 0545 06/20/20 0900 06/20/20 0945 06/20/20 1339  BP: (!) 159/110 (!) 189/117 (!) 168/102 (!) 165/94  Pulse: (!) 52 (!) 59 60 (!) 56  Resp: 16 16 16 20   Temp: (!) 96.4 F (35.8 C) (!) 97 F (36.1 C) 97.8 F (36.6 C) 97.7 F (36.5 C)  TempSrc: Axillary Oral Oral Oral  SpO2: 99% 99%  100%  Weight:      Height:        Intake/Output Summary (Last 24 hours) at 06/20/2020 1421 Last data filed at 06/20/2020 0900 Gross per 24 hour  Intake 699.4 ml  Output 600 ml  Net 99.4 ml   Filed Weights   06/15/20 2129  Weight: 72.6 kg    Examination:  General exam: Appears calm and comfortable  Respiratory system: Clear to auscultation. Respiratory effort normal. Cardiovascular system: S1 & S2 heard, RRR. No JVD, murmurs, rubs, gallops or clicks. No pedal edema. Gastrointestinal system: Abdomen is nondistended, soft and nontender. No organomegaly or masses felt. Normal bowel sounds heard. Central nervous system: Alert and oriented. No focal neurological deficits. Extremities: 1+ edema to bilateral lower extremity. Skin: No rashes, lesions or ulcers Psychiatry: Confused   Data Reviewed: I have personally reviewed following labs and imaging studies  CBC: Recent Labs  Lab 06/16/20 0002 06/17/20  0413  WBC 16.0* 10.1  NEUTROABS 13.7*  --   HGB 12.5 11.1*  HCT 38.8 35.8*  MCV 96.0 99.2  PLT 215 169   Basic Metabolic Panel: Recent Labs  Lab 06/16/20 0002 06/17/20 0413 06/18/20 0353 06/19/20 1631  NA 140 142 145 147*  K 3.6 4.0 4.6 3.6  CL 95* 103 107 111  CO2 29 30 28 27   GLUCOSE 146* 89 103* 110*  BUN 23 33* 40* 30*  CREATININE 1.06* 1.47* 1.53* 0.97  CALCIUM 9.6 8.3* 8.5* 8.6*   GFR: Estimated Creatinine Clearance: 32.2 mL/min (by C-G formula based on SCr of 0.97 mg/dL). Liver Function Tests: Recent Labs  Lab 06/16/20 0002  AST 30   ALT 21  ALKPHOS 81  BILITOT 0.9  PROT 7.4  ALBUMIN 3.9   No results for input(s): LIPASE, AMYLASE in the last 168 hours. No results for input(s): AMMONIA in the last 168 hours. Coagulation Profile: No results for input(s): INR, PROTIME in the last 168 hours. Cardiac Enzymes: No results for input(s): CKTOTAL, CKMB, CKMBINDEX, TROPONINI in the last 168 hours. BNP (last 3 results) No results for input(s): PROBNP in the last 8760 hours. HbA1C: No results for input(s): HGBA1C in the last 72 hours. CBG: No results for input(s): GLUCAP in the last 168 hours. Lipid Profile: No results for input(s): CHOL, HDL, LDLCALC, TRIG, CHOLHDL, LDLDIRECT in the last 72 hours. Thyroid Function Tests: No results for input(s): TSH, T4TOTAL, FREET4, T3FREE, THYROIDAB in the last 72 hours. Anemia Panel: No results for input(s): VITAMINB12, FOLATE, FERRITIN, TIBC, IRON, RETICCTPCT in the last 72 hours. Sepsis Labs: No results for input(s): PROCALCITON, LATICACIDVEN in the last 168 hours.  Recent Results (from the past 240 hour(s))  Respiratory Panel by RT PCR (Flu A&B, Covid) - Nasopharyngeal Swab     Status: None   Collection Time: 06/16/20 12:15 AM   Specimen: Nasopharyngeal Swab  Result Value Ref Range Status   SARS Coronavirus 2 by RT PCR NEGATIVE NEGATIVE Final    Comment: (NOTE) SARS-CoV-2 target nucleic acids are NOT DETECTED.  The SARS-CoV-2 RNA is generally detectable in upper respiratoy specimens during the acute phase of infection. The lowest concentration of SARS-CoV-2 viral copies this assay can detect is 131 copies/mL. A negative result does not preclude SARS-Cov-2 infection and should not be used as the sole basis for treatment or other patient management decisions. A negative result may occur with  improper specimen collection/handling, submission of specimen other than nasopharyngeal swab, presence of viral mutation(s) within the areas targeted by this assay, and inadequate  number of viral copies (<131 copies/mL). A negative result must be combined with clinical observations, patient history, and epidemiological information. The expected result is Negative.  Fact Sheet for Patients:  06/18/20  Fact Sheet for Healthcare Providers:  https://www.moore.com/  This test is no t yet approved or cleared by the https://www.young.biz/ FDA and  has been authorized for detection and/or diagnosis of SARS-CoV-2 by FDA under an Emergency Use Authorization (EUA). This EUA will remain  in effect (meaning this test can be used) for the duration of the COVID-19 declaration under Section 564(b)(1) of the Act, 21 U.S.C. section 360bbb-3(b)(1), unless the authorization is terminated or revoked sooner.     Influenza A by PCR NEGATIVE NEGATIVE Final   Influenza B by PCR NEGATIVE NEGATIVE Final    Comment: (NOTE) The Xpert Xpress SARS-CoV-2/FLU/RSV assay is intended as an aid in  the diagnosis of influenza from Nasopharyngeal swab specimens and  should not be used as a sole basis for treatment. Nasal washings and  aspirates are unacceptable for Xpert Xpress SARS-CoV-2/FLU/RSV  testing.  Fact Sheet for Patients: https://www.moore.com/  Fact Sheet for Healthcare Providers: https://www.young.biz/  This test is not yet approved or cleared by the [place] FDA and  has been authorized for detection and/or diagnosis of SARS-CoV-2 by  FDA under an Emergency Use Authorization (EUA). This EUA will remain  in effect (meaning this test can be used) for the duration of the  Covid-19 declaration under Section 564(b)(1) of the Act, 21  U.S.C. section 360bbb-3(b)(1), unless the authorization is  terminated or revoked. Performed at [facility], [address] [place], [place] [address]   Culture, Urine     Status: Abnormal   Collection Time: 06/16/20  3:30 PM   Specimen: Urine, Catheterized   Result Value Ref Range Status   Specimen Description   Final    URINE, CATHETERIZED Performed at [facility], [address], [place], [place] [address]    Special Requests   Final    URINE, RANDOM Performed at [facility], [address] [place], [place] [address]    Culture >=100,000 COLONIES/mL ESCHERICHIA COLI (A)  Final   Report Status 06/19/2020 FINAL  Final   Organism ID, Bacteria ESCHERICHIA COLI (A)  Final      Susceptibility   Escherichia coli - MIC*    AMPICILLIN >=32 RESISTANT Resistant     CEFAZOLIN <=4 SENSITIVE Sensitive     CEFTRIAXONE <=0.25 SENSITIVE Sensitive     CIPROFLOXACIN >=4 RESISTANT Resistant     GENTAMICIN <=1 SENSITIVE Sensitive     IMIPENEM <=0.25 SENSITIVE Sensitive     NITROFURANTOIN 32 SENSITIVE Sensitive     TRIMETH/SULFA <=20 SENSITIVE Sensitive     AMPICILLIN/SULBACTAM 4 SENSITIVE Sensitive     PIP/TAZO <=4 SENSITIVE Sensitive     * >=100,000 COLONIES/mL ESCHERICHIA COLI         Radiology Studies: No results found.      Scheduled Meds:  acetaminophen (TYLENOL) oral liquid 160 mg/5 mL  650 mg Oral Q6H   cephALEXin  500 mg Oral Q12H   cholecalciferol  500 Units Oral BID   famotidine  20 mg Oral Daily   heparin  5,000 Units Subcutaneous Q8H   hydrALAZINE  10 mg Oral BID   multivitamins with iron  1 tablet Oral Daily   nebivolol  10 mg Oral BID   Continuous Infusions:  sodium chloride 100 mL/hr at 06/20/20 0516     LOS: 3 days     Alwyn Ren, MD 06/20/2020, 2:21 PM

## 2020-06-20 NOTE — Progress Notes (Signed)
Daily Progress Note   Patient Name: Felicia Wells       Date: 06/20/2020 DOB: Sep 13, 1918  Age: 86 y.o. MRN#: [id number] Attending Physician: Alwyn Ren, MD Primary Care Physician: Kari Baars, MD Admit Date: 06/15/2020  Reason for Consultation/Follow-up: Establishing goals of care  Subjective: I called and was able to reach patient's daughter, Felicia Wells.  We discussed clinical course overnight and the fact that Ms. Brecht appears to be more awake today.  We also discussed that, while she is more interactive today, she has continued to have decline in nutrition, cognition, and functional status even prior to this admission.  We reviewed options for care moving forward including trial of rehab at skilled facility.  Felicia Wells reports that she and Nadine Counts had discussed this option, however, Felicia Wells is concerned that it would be similar to when they attempted trial of rehab for her father couple of years ago.  He ended up transitioning to skilled facility but then was unable to participate in rehab due to how weak he was and they eventually needed to transition him back to memory care unit with hospice services.  Felicia Wells reports that she thinks that her mother may be best served by working to transition back to [facility] care unit with the assistance of hospice if this is a possibility.  She was in agreement with me discussing further with LCSW to see if they can reach out to [facility] and see if this is a possibility.  Length of Stay: 3  Current Medications: Scheduled Meds:  . acetaminophen (TYLENOL) oral liquid 160 mg/5 mL  650 mg Oral Q6H  . cephALEXin  500 mg Oral Q12H  . cholecalciferol  500 Units Oral BID  . famotidine  20 mg Oral Daily  . heparin  5,000 Units Subcutaneous Q8H    . hydrALAZINE  10 mg Oral BID  . multivitamins with iron  1 tablet Oral Daily  . nebivolol  10 mg Oral BID    Continuous Infusions: . sodium chloride 100 mL/hr at 06/20/20 0516    PRN Meds: fentaNYL (SUBLIMAZE) injection, LORazepam **OR** LORazepam, ondansetron **OR** ondansetron (ZOFRAN) IV, oxyCODONE, traZODone  Physical Exam        General: Sleeping but arouses easily, still confused to her situation but certainly more conversive today. HEENT: No bruits, no goiter, no  JVD Heart: Regular rate and rhythm. No murmur appreciated. Lungs: Good air movement, clear Abdomen: Soft, nontender, nondistended, positive bowel sounds.  Ext: No significant edema Skin: Warm and dry Neuro: Eyes open, answer some questions, still does not follow commands  Vital Signs: BP (!) 189/117 (BP Location: Left Arm) Comment: Notified RN   Pulse (!) 59   Temp (!) 97 F (36.1 C) (Oral)   Resp 16   Ht 5\' 6"  (1.676 m)   Wt 72.6 kg   SpO2 99%   BMI 25.82 kg/m  SpO2: SpO2: 99 % O2 Device: O2 Device: Nasal Cannula O2 Flow Rate: O2 Flow Rate (L/min): 2 L/min  Intake/output summary:   Intake/Output Summary (Last 24 hours) at 06/20/2020 1241 Last data filed at 06/20/2020 0900 Gross per 24 hour  Intake 1414.44 ml  Output 650 ml  Net 764.44 ml   LBM: Last BM Date:  (unknown) Baseline Weight: Weight: 72.6 kg Most recent weight: Weight: 72.6 kg       Palliative Assessment/Data:    Flowsheet Rows     Most Recent Value  Intake Tab  Referral Department Hospitalist  Unit at Time of Referral Med/Surg Unit  Palliative Care Primary Diagnosis Sepsis/Infectious Disease  Date Notified 06/19/20  Palliative Care Type Return patient Palliative Care  Reason for referral Clarify Goals of Care  Date of Admission 06/15/20  Date first seen by Palliative Care 06/19/20  # of days Palliative referral response time 0 Day(s)  # of days IP prior to Palliative referral 4  Clinical Assessment  Palliative  Performance Scale Score 10%  Psychosocial & Spiritual Assessment  Palliative Care Outcomes  Patient/Family meeting held? No      Patient Active Problem List   Diagnosis Date Noted  . UTI (urinary tract infection) 06/17/2020  . Closed fracture of multiple pubic rami, left, initial encounter (HCC) 06/15/2020  . Dementia with behavioral disturbance (HCC) 04/11/2016  . Lower GI bleed 04/09/2016  . Diverticulosis large intestine w/o perforation or abscess w/bleeding   . Palliative care encounter   . Hematochezia 09/12/2015  . DNR (do not resuscitate) 09/12/2015  . Bleeding gastrointestinal   . CAP (community acquired pneumonia) 12/04/2012  . Hyponatremia 12/04/2012  . Muscular deconditioning 12/04/2012  . Acute delirium 12/04/2012  . Fracture of acetabulum (HCC) 07/19/2012  . Essential hypertension 07/19/2012  . Meniere's disease 07/19/2012  . Muscle weakness (generalized) 06/03/2011  . Difficulty in walking(719.7) 06/03/2011    Palliative Care Assessment & Plan   Patient Profile: 86 y.o. female  with past medical history of osteoporosis, hypertension, glaucoma, GERD, anxiety, resident of assisted living facility admitted on 06/15/2020 with hip pain and changes in mental status who was subsequently found to have pubic rami fracture.  She is also currently being treated for UTI and has been noted to have altered mental status as well as AKI.  Palliative consulted for goals of care.  Recommendations/Plan:  DNR/DNI  Reviewed MOST form with patient's daughter.  She reports that it is up-to-date with desire for DNR, comfort measures, determine antibiotic use at time infection occurs, no IV fluids, no feeding tube.  Pain/mental status: Improved today.  Continue same with scheduled Tylenol and judicious use of as needed opioids.  Discussed options moving forward of transition to [facility] for trial of rehab versus consideration for transition back to memory care unit at [place] with  hospice services if they are able to take her back.  [place] reports that she will discuss with her brother Felicia Wells  but she feels that transition back to memory care unit with hospice services is likely in her mother's best interest.  She reports that her father was in a similar situation a couple of years ago and they transitioned him for trial of rehab but then ended up electing hospice benefits shortly after.  I talked with LCSW who will reach out to daughter to further discuss once she clarifies with Chip Boer if memory care unit with hospice is a viable option.   Code Status:    Code Status Orders  (From admission, onward)         Start     Ordered   06/16/20 0131  Do not attempt resuscitation (DNR)  Continuous       Question Answer Comment  In the event of cardiac or respiratory ARREST Do not call a "code blue"   In the event of cardiac or respiratory ARREST Do not perform Intubation, CPR, defibrillation or ACLS   In the event of cardiac or respiratory ARREST Use medication by any route, position, wound care, and other measures to relive pain and suffering. May use oxygen, suction and manual treatment of airway obstruction as needed for comfort.      06/16/20 0130        Code Status History    Date Active Date Inactive Code Status Order ID Comments User Context   04/12/2016 2200 04/15/2016 1712 DNR [id number]  Ozella Rocks, MD ED   04/09/2016 1338 04/11/2016 1427 DNR [id number]  Henderson Cloud, MD Inpatient   09/12/2015 0830 09/14/2015 1741 DNR [id number]  Houston Siren, MD Inpatient   09/12/2015 0446 09/12/2015 0830 DNR [id number]  Ward, Layla Maw, DO ED   07/18/2012 2257 07/22/2012 1801 Full Code [id number]  Carylon Perches, MD ED   Advance Care Planning Activity    Advance Directive Documentation     Most Recent Value  Type of Advance Directive Out of facility DNR (pink MOST or yellow form)  Pre-existing out of facility DNR order (yellow form or pink MOST form) Physician notified to  receive inpatient order  [patient is  DNR this admission]  "MOST" Form in Place? --       Prognosis:   < 6 months most likely  Discharge Planning:  Skilled Nursing Facility for rehab with Palliative care service follow-up vs memory care with hospice support  Care plan was discussed with daughter, Vanetta Shawl  Thank you for allowing the Palliative Medicine Team to assist in the care of this patient.   Time In: 1120 Time Out: 1200 Total Time 40 Prolonged Time Billed No      Greater than 50%  of this time was spent counseling and coordinating care related to the above assessment and plan.  Romie Minus, MD  Please contact Palliative Medicine Team phone at [phone] for questions and concerns.

## 2020-06-20 NOTE — Progress Notes (Signed)
Physical Therapy Treatment Patient Details Name: Felicia Wells MRN: [id number] DOB: 01/07/1919 Today's Date: 06/20/2020    History of Present Illness 86 y.o. female, with history of osteoporosis, hypertension, glaucoma, GERD, anxiety, and more presents to the ED with a chief complaint of hip pain.  Patient is demented and is not able to provide any history.  Per report patient fell the night of 10/14 - 10/15.  Staff at [facility] but she was okay but then noticed that she was having ambulatory dysfunction today.  Staff reported that patient was complaining of left-sided hip pain.  She was transported to the ER for further evaluation of his hip pain.   X-ray of lumbar spine shows pubic rami fractures on the left, and multilevel degenerative change in the lumbar spine without acute abnormality    PT Comments    TOC team requested physical therapy work with pt to assess physical mobility needs to determine d/c plan.  Pt does initiate movement however continues to require increased assist for bed mobility and standing.  Pt also with left lateral lean today in sitting and had difficulty sitting EOB so trunk support provided.  Pt returned to supine and repositioned; pt quickly fell asleep once back in bed.  Upon d/c, recommend +2 available if OOB due to assist level and variable cognition.    Follow Up Recommendations  Supervision/Assistance - 24 hour;SNF (can return to ALF if they can provide current assist, otherwise may need SNF)     Equipment Recommendations  None recommended by PT    Recommendations for Other Services       Precautions / Restrictions Precautions Precautions: Fall Restrictions Weight Bearing Restrictions: No Other Position/Activity Restrictions: no restrictions placed in orders    Mobility  Bed Mobility Overal bed mobility: Needs Assistance Bed Mobility: Supine to Sit;Sit to Supine     Supine to sit: Max assist;+2 for physical assistance Sit to supine: Max  assist;+2 for physical assistance   General bed mobility comments: pt initiates movement and provided time to perform however requested and also required assist to complete bed mobility  Transfers Overall transfer level: Needs assistance Equipment used: 2 person hand held assist Transfers: Sit to/from Stand Sit to Stand: Mod assist;+2 physical assistance         General transfer comment: pt does not have official WBing status however did assist to standing to assess abilities, pt was assisting as able although still required at least mod assist for stability and weakness, only tolerated less than 30 seconds  Ambulation/Gait                 Stairs             Wheelchair Mobility    Modified Rankin (Stroke Patients Only)       Balance Overall balance assessment: Needs assistance;History of Falls Sitting-balance support: Single extremity supported;Feet supported Sitting balance-Leahy Scale: Zero Sitting balance - Comments: pt with left lateral lean, unable to correct with cues and assist Postural control: Left lateral lean Standing balance support: Bilateral upper extremity supported Standing balance-Leahy Scale: Zero Standing balance comment: requires UE support and increased external assist                            Cognition Arousal/Alertness: Awake/alert Behavior During Therapy: Restless Overall Cognitive Status: History of cognitive impairments - at baseline  General Comments: alert, cues for keeping eyes open today, pt states she wants to go do "pickle" or "something easy"      Exercises      General Comments        Pertinent Vitals/Pain Pain Assessment: Faces Faces Pain Scale: No hurt Pain Intervention(s): Repositioned;Monitored during session (fell asleep upon return to bed)    Home Living                      Prior Function            PT Goals (current goals can now  be found in the care plan section) Progress towards PT goals: Progressing toward goals    Frequency    Min 2X/week      PT Plan Current plan remains appropriate    Co-evaluation              AM-PAC PT "6 Clicks" Mobility   Outcome Measure  Help needed turning from your back to your side while in a flat bed without using bedrails?: A Lot Help needed moving from lying on your back to sitting on the side of a flat bed without using bedrails?: A Lot Help needed moving to and from a bed to a chair (including a wheelchair)?: A Lot Help needed standing up from a chair using your arms (e.g., wheelchair or bedside chair)?: A Lot Help needed to walk in hospital room?: Total Help needed climbing 3-5 steps with a railing? : Total 6 Click Score: 10    End of Session Equipment Utilized During Treatment: Gait belt Activity Tolerance: Patient tolerated treatment well Patient left: in bed;with call bell/phone within reach;with bed alarm set Nurse Communication: Mobility status PT Visit Diagnosis: Other abnormalities of gait and mobility (R26.89);History of falling (Z91.81)     Time: 8563-1497 PT Time Calculation (min) (ACUTE ONLY): 17 min  Charges:  $Therapeutic Activity: 8-22 mins                    Thomasene Mohair PT, DPT Acute Rehabilitation Services Pager: [phone] Office: [phone]  Maida Sale E 06/20/2020, 3:27 PM

## 2020-06-20 NOTE — TOC Progression Note (Signed)
Transition of Care [facility]) - Progression Note    Patient Details  Name: Felicia Wells MRN: [id number] Date of Birth: 1919-05-27  Transition of Care [facility]) CM/SW Contact  Amada Jupiter, LCSW Phone Number: 06/20/2020, 2:56 PM  Clinical Narrative:    Have spoken with pt's son and daughter this afternoon following discussions with Dr. Neale Burly, Dr. Ashley Royalty and liaisons at [facility].  All aware that I had been working on SNF placement, however, discussion began around possible return to [facility] into the memory care division with Hospice services to follow.  Son and daughter would prefer this option as they feel SNF would only be a short term "fix".  Family is definitely agreed with referral for ongoing Hospice services and request a referral to [facility] regardless of dc location.   At this time, have requested that PT see pt again to determine current level of assistance.  [facility] having concerns with PT eval that noted need for 2+ assistance.  Plan to follow up with family and treatment team tomorrow to make a final decision on dc destination.   Expected Discharge Plan: Skilled Nursing Facility Barriers to Discharge: Continued Medical Work up  Expected Discharge Plan and Services Expected Discharge Plan: Skilled Nursing Facility In-house Referral: Clinical Social Work   Post Acute Care Choice: Skilled Nursing Facility Living arrangements for the past 2 months: Assisted Living Facility                                       Social Determinants of Health (SDOH) Interventions    Readmission Risk Interventions No flowsheet data found.

## 2020-06-20 NOTE — Progress Notes (Signed)
Patient less aggressive than prior night but continues to attempt to pull at lines and devices. Patient took hs p.o. medications.

## 2020-06-21 LAB — SARS CORONAVIRUS 2 BY RT PCR (HOSPITAL ORDER, PERFORMED IN ~~LOC~~ HOSPITAL LAB): SARS Coronavirus 2: NEGATIVE

## 2020-06-21 MED ORDER — BOOST / RESOURCE BREEZE PO LIQD CUSTOM: 1.0000 | ORAL | Status: DC

## 2020-06-21 MED ORDER — KATE FARMS STANDARD 1.4 PO LIQD
325.0000 mL | Freq: Every day | ORAL | Status: DC
  Filled 2020-06-21: qty 325

## 2020-06-21 MED ORDER — NEBIVOLOL HCL 10 MG PO TABS: 10.0000 mg | ORAL_TABLET | Freq: Two times a day (BID) | ORAL | 0 refills | Status: DC

## 2020-06-21 MED ORDER — SERTRALINE HCL 25 MG PO TABS
25.0000 mg | ORAL_TABLET | Freq: Every day | ORAL | Status: DC
  Administered 2020-06-21: 25 mg via ORAL
  Filled 2020-06-21: qty 1

## 2020-06-21 MED ORDER — IRBESARTAN 75 MG PO TABS
75.0000 mg | ORAL_TABLET | Freq: Every day | ORAL | Status: DC
  Administered 2020-06-21: 75 mg via ORAL
  Filled 2020-06-21: qty 1

## 2020-06-21 MED ORDER — HYDRALAZINE HCL 25 MG PO TABS: 25.0000 mg | ORAL_TABLET | Freq: Four times a day (QID) | ORAL | 11 refills | Status: DC

## 2020-06-21 MED ORDER — HYDRALAZINE HCL 20 MG/ML IJ SOLN
10.0000 mg | Freq: Once | INTRAMUSCULAR | Status: AC
  Administered 2020-06-21: 10 mg via INTRAVENOUS
  Filled 2020-06-21: qty 1

## 2020-06-21 MED ORDER — ONDANSETRON HCL 4 MG PO TABS: 4.0000 mg | ORAL_TABLET | Freq: Four times a day (QID) | ORAL | 0 refills | Status: AC | PRN

## 2020-06-21 MED ORDER — TIMOLOL MALEATE 0.5 % OP SOLN
1.0000 [drp] | Freq: Every day | OPHTHALMIC | Status: DC
  Administered 2020-06-21: 1 [drp] via OPHTHALMIC
  Filled 2020-06-21: qty 5

## 2020-06-21 MED ORDER — MIRTAZAPINE 15 MG PO TABS
7.5000 mg | ORAL_TABLET | Freq: Every evening | ORAL | Status: DC
  Administered 2020-06-21: 7.5 mg via ORAL
  Filled 2020-06-21: qty 1

## 2020-06-21 NOTE — TOC Transition Note (Signed)
Transition of Care [facility]) - CM/SW Discharge Note   Patient Details  Name: ELENOR WILDES MRN: [id number] Date of Birth: 1918-09-06  Transition of Care [facility]) CM/SW Contact:  Amada Jupiter, LCSW Phone Number: 06/21/2020, 4:17 PM   Clinical Narrative:    Received call from [facility] today confirming they cannot meet pt's current care needs.  Family has requested that we proceed with SNF placement.  Bed offer received and accepted with [facility] and pt medically cleared for dc to facility today.  [facility] has been called.  No further TOC needs.   Final next level of care: Skilled Nursing Facility Barriers to Discharge: Barriers Resolved   Patient Goals and CMS Choice     Choice offered to / list presented to : Adult Children  Discharge Placement PASRR number recieved: 06/18/20            Patient chooses bed at: [facility] Patient to be transferred to facility by: [facility] Name of family member notified: son, Shawnelle Spoerl Patient and family notified of of transfer: 06/21/20  Discharge Plan and Services In-house Referral: Clinical Social Work   Post Acute Care Choice: Skilled Nursing Facility          DME Arranged: N/A DME Agency: NA       HH Arranged: NA HH Agency: NA        Social Determinants of Health (SDOH) Interventions     Readmission Risk Interventions No flowsheet data found.

## 2020-06-21 NOTE — Progress Notes (Signed)
Nutrition Follow-up RD working remotely.  DOCUMENTATION CODES:   Not applicable  INTERVENTION:  - will order Boost Breeze once/day, each supplement provides 250 kcal and 9 grams of protein. - will order Magic Cup BID with meals, each supplement provides 290 kcal and 9 grams of protein. - will order Molli Posey 1.4 po once/day, each supplement provides 455 kcal and 20 grams protein.  - will order 1 tablet multivitamin with minerals/day.  NUTRITION DIAGNOSIS:   Increased nutrient needs related to hip fracture as evidenced by estimated needs. -ongoing  GOAL:   Patient will meet greater than or equal to 90% of their needs -unmet  MONITOR:   PO intake, Supplement acceptance, Weight trends  ASSESSMENT:   86 year old female who presented to the ED with hip pain. PMH of osteoporosis, HTN, glaucoma, GERD, anxiety, dementia. Pt found to have left pubic rami fracture.  Ensure Enlive was previously ordered but has been discontinued. She has been eating 0-30% of meals over the past 5 days.   She has not been weighed since admission on 10/15. Patient noted to be a/o to self only.   Palliative Care saw patient yesterday and prognosis listed as likely <6 months.    Labs reviewed; Na: 147 mmol/l, BUN: 30 mg/dl, Ca: 8.6 mg/dl, GFR: 48 ml/min. Medications reviewed; 500 units cholecalciferol BID, 20 mg oral pepcid/day, 1 tablet multivitamin with iron/day. IVF; NS @ 50 ml/hr    NUTRITION - FOCUSED PHYSICAL EXAM:  unable to complete at this time.   Diet Order:   Diet Order            Diet regular Room service appropriate? Yes; Fluid consistency: Thin  Diet effective now                 EDUCATION NEEDS:   No education needs have been identified at this time  Skin:  Skin Assessment: Reviewed RN Assessment  Last BM:  unknown  Height:   Ht Readings from Last 1 Encounters:  06/15/20 5\' 6"  (1.676 m)    Weight:   Wt Readings from Last 1 Encounters:  06/15/20 72.6 kg      Estimated Nutritional Needs:  Kcal:  1350-1550 Protein:  70-85 grams Fluid:  1.4-1.6 L      06/17/20, MS, RD, LDN, CNSC Inpatient Clinical Dietitian RD pager # available in [facility]  After hours/weekend pager # available in [facility]

## 2020-06-21 NOTE — Discharge Summary (Signed)
Physician Discharge Summary  Felicia GasserVirginia P Wells WUJ:[id number] DOB: 07/02/1919 DOA: 06/15/2020  PCP: Kari BaarsHawkins, Edward, MD  Admit date: 06/15/2020 Discharge date: 06/21/2020  Admitted From: Memory care Disposition: Skilled nursing facility Recommendations for Outpatient Follow-up:  1. Follow up with PCP in 1-2 weeks 2. Please obtain BMP/CBC in one week 3. Palliative care follow-up at the facility Please note that I have stopped her Lasix she was on 20 mg twice a day this has been stopped due to dehydration AKI and very decreased p.o. intake.  Consider restarting this if needed. Home Health: None Equipment/Devices: None  Discharge Condition stable CODE STATUS DO NOT RESUSCITATE Diet recommendation: Cardiac Brief/Interim Summary:86 year old female with history of osteoporosis, hypertension, glaucoma, GERD, anxiety came to ED with complaints of hip pain. Patient demented does not provide any history. Patient reportedly fell on 06/14/2020 night. Patient had ambulatory dysfunction after that. In the ED imaging studies showed pubic rami fracture on the left.   Discharge Diagnoses:  Active Problems:   Closed fracture of multiple pubic rami, left, initial encounter (HCC)   UTI (urinary tract infection)   #1 left pubic Ramey fracture treat with Tylenol minimize narcotics.  #2 UTI E. coli -treated with Keflex.    #3 worsening mental status in the setting of dementia likely due to UTI recent hospitalization and recent fracture.  #4 history of hypertension on hydralazine.  Lasix on hold due to AKI.  #5 goals of care-patient is DNR.  Palliative care to follow at the facility.  #6 AKI resolved.  Due to decreased p.o. intake and dehydration .    Nutrition Problem: Increased nutrient needs Etiology: hip fracture    Signs/Symptoms: estimated needs     Interventions: Ensure Enlive (each supplement provides 350kcal and 20 grams of protein), MVI, Liberalize Diet  Estimated body  mass index is 25.82 kg/m as calculated from the following:   Height as of this encounter: 5\' 6"  (1.676 m).   Weight as of this encounter: 72.6 kg.  Discharge Instructions  Discharge Instructions    Diet - low sodium heart healthy   Complete by: As directed    Increase activity slowly   Complete by: As directed      Allergies as of 06/21/2020      Reactions   Alendronate Other (See Comments)   Alendronic Acid Per MAR Reaction:  Cramping    Butazolidin [phenylbutazone] Swelling   Codeine Nausea And Vomiting   Darvocet [propoxyphene N-acetaminophen] Nausea And Vomiting   Ezetimibe-simvastatin Other (See Comments)   Reaction:  Numbness    Meperidine Nausea And Vomiting   Morphine And Related Other (See Comments)   Reaction:  Unknown    Niacin Nausea And Vomiting, Other (See Comments)   Reaction:  Bloating    Oxybutynin Chloride Other (See Comments)   Reaction:  Confusion    Prednisone Other (See Comments)   Reaction:  Confusion    Propoxyphene Nausea And Vomiting   Raloxifene Other (See Comments)   Reaction:  Cramping    Risedronate Other (See Comments)   Reaction:  Dizziness    Simvastatin Other (See Comments)   Reaction:  Bloating    Triamterene Nausea And Vomiting   Diltiazem Hcl Rash   Lansoprazole Rash   Pravastatin Rash   Sulfa Antibiotics Rash   Teriparatide Palpitations      Medication List    STOP taking these medications   furosemide 20 MG tablet Commonly known as: LASIX     TAKE these medications   cholecalciferol 1000  units tablet Commonly known as: VITAMIN D Take 500 Units by mouth daily.   CRANBERRY PO Take 1 tablet by mouth daily.   Ensure Take 237 mLs by mouth 4 (four) times daily.   famotidine 20 MG tablet Commonly known as: PEPCID Take 1 tablet (20 mg total) by mouth 2 (two) times daily.   hydrALAZINE 25 MG tablet Commonly known as: APRESOLINE Take 1 tablet (25 mg total) by mouth 4 (four) times daily. What changed:   medication  strength  how much to take  when to take this   meclizine 25 MG tablet Commonly known as: ANTIVERT Take 25 mg by mouth 3 (three) times daily as needed for dizziness.   mirtazapine 7.5 MG tablet Commonly known as: REMERON Take 7.5 mg by mouth every evening.   multivitamin-lutein Caps capsule Take 1 capsule by mouth daily.   multivitamins with iron Tabs tablet Take 1 tablet by mouth daily.   nebivolol 10 MG tablet Commonly known as: BYSTOLIC Take 1 tablet (10 mg total) by mouth 2 (two) times daily. What changed:   medication strength  when to take this  Another medication with the same name was removed. Continue taking this medication, and follow the directions you see here.   omega-3 acid ethyl esters 1 g capsule Commonly known as: LOVAZA Take 2 g by mouth daily.   ondansetron 4 MG tablet Commonly known as: ZOFRAN Take 1 tablet (4 mg total) by mouth every 6 (six) hours as needed for nausea.   potassium chloride 10 MEQ tablet Commonly known as: KLOR-CON Take 1 tablet (10 mEq total) by mouth daily.   sertraline 25 MG tablet Commonly known as: ZOLOFT Take 25 mg by mouth daily.   Soothe XP Soln Place 1 drop into both eyes 4 (four) times daily.   telmisartan 20 MG tablet Commonly known as: MICARDIS Take 20 mg by mouth daily.   timolol 0.5 % ophthalmic solution Commonly known as: TIMOPTIC Place 1 drop into both eyes daily.       Contact information for after-discharge care    Destination    [facility] .   Service: Skilled Nursing Contact information: [address] [place] [address] [phone]                 Allergies  Allergen Reactions  . Alendronate Other (See Comments)    Alendronic Acid Per MAR Reaction:  Cramping   . Butazolidin [Phenylbutazone] Swelling  . Codeine Nausea And Vomiting  . Darvocet [Propoxyphene N-Acetaminophen] Nausea And Vomiting  . Ezetimibe-Simvastatin Other (See  Comments)    Reaction:  Numbness   . Meperidine Nausea And Vomiting  . Morphine And Related Other (See Comments)    Reaction:  Unknown   . Niacin Nausea And Vomiting and Other (See Comments)    Reaction:  Bloating   . Oxybutynin Chloride Other (See Comments)    Reaction:  Confusion   . Prednisone Other (See Comments)    Reaction:  Confusion   . Propoxyphene Nausea And Vomiting  . Raloxifene Other (See Comments)    Reaction:  Cramping   . Risedronate Other (See Comments)    Reaction:  Dizziness   . Simvastatin Other (See Comments)    Reaction:  Bloating   . Triamterene Nausea And Vomiting  . Diltiazem Hcl Rash  . Lansoprazole Rash  . Pravastatin Rash  . Sulfa Antibiotics Rash  . Teriparatide Palpitations    Consultations:  palliative care  Procedures/Studies: DG Chest 1 View  Result Date: 06/15/2020 CLINICAL DATA:  Possible patient fall.  Unable to ambulate today. EXAM: CHEST  1 VIEW COMPARISON:  Chest x-ray 06/21/2019 FINDINGS: The heart size and mediastinal contours are unchanged with cardiomegaly. No focal consolidation. No pulmonary edema. No pleural effusion. No pneumothorax. No acute osseous abnormality. Multilevel degenerative changes of the spine. IMPRESSION: No acute cardiopulmonary disease. Electronically Signed   By: Tish Frederickson M.D.   On: 06/15/2020 23:56   DG Thoracic Spine 2 View  Result Date: 06/15/2020 CLINICAL DATA:  Recent fall with chest pain, initial encounter EXAM: THORACIC SPINE 2 VIEWS COMPARISON:  06/21/2019 FINDINGS: Vertebral body height is well maintained. Mild osteophytic changes are seen. No paraspinal mass lesion is noted. Pedicles are within normal limits. IMPRESSION: Mild degenerative change without acute abnormality. Electronically Signed   By: Alcide Clever M.D.   On: 06/15/2020 23:22   DG Lumbar Spine 2-3 Views  Result Date: 06/15/2020 CLINICAL DATA:  Recent fall with low back pain EXAM: LUMBAR SPINE - 2-3 VIEW COMPARISON:  04/21/2011  FINDINGS: Five lumbar type vertebral bodies are well visualized. Vertebral body height is well maintained. Disc space narrowing is noted throughout the lumbar spine. Degenerative changes are seen with mild retrolisthesis of L4 on L5. No paraspinal soft tissue abnormality is noted. The known pubic rami fractures on the left are again seen. IMPRESSION: Pubic rami fractures on the left. Multilevel degenerative change in the lumbar spine without acute abnormality. Electronically Signed   By: Alcide Clever M.D.   On: 06/15/2020 23:25   DG Elbow Complete Right  Result Date: 06/15/2020 CLINICAL DATA:  Recent fall with right elbow pain, initial encounter EXAM: RIGHT ELBOW - COMPLETE 3+ VIEW COMPARISON:  None. FINDINGS: There is no evidence of fracture, dislocation, or joint effusion. There is no evidence of arthropathy or other focal bone abnormality. Soft tissues are unremarkable. IMPRESSION: No acute abnormality noted. Electronically Signed   By: Alcide Clever M.D.   On: 06/15/2020 23:21   CT Head Wo Contrast  Result Date: 06/15/2020 CLINICAL DATA:  unknown per staff if patient fell. Staff reports that patient has not been able to ambulate today as normal and is complaining of left hip pain and patient has an abrasion/ski EXAM: CT HEAD WITHOUT CONTRAST CT CERVICAL SPINE WITHOUT CONTRAST TECHNIQUE: Multidetector CT imaging of the head and cervical spine was performed following the standard protocol without intravenous contrast. Multiplanar CT image reconstructions of the cervical spine were also generated. COMPARISON:  CT C-spine 10/20/2013, CT head 06/21/2019. FINDINGS: CT HEAD FINDINGS Brain: Cerebral ventricle sizes are concordant with the degree of cerebral volume loss. Patchy and confluent areas of decreased attenuation are noted throughout the deep and periventricular white matter of the cerebral hemispheres bilaterally, compatible with chronic microvascular ischemic disease. Incidentally noted left mid  cranial fossa arachnoid cyst. Similar-appearing densely calcified pineal gland. No evidence of large-territorial acute infarction. No parenchymal hemorrhage. No mass lesion. No extra-axial collection. No mass effect or midline shift. No hydrocephalus. Basilar cisterns are patent. Vascular: No hyperdense vessel. Atherosclerotic calcifications are present within the cavernous internal carotid arteries. Skull: No acute fracture or focal lesion. Sinuses/Orbits: Paranasal sinuses and mastoid air cells are clear. Bilateral lens replacement. Orbits are unremarkable. Other: None. CT CERVICAL SPINE FINDINGS Alignment: Reversal of the normal cervical lordosis likely due to positioning and degenerative changes. Interval development of grade 1 anterolisthesis of C2 on C3 with no findings to suggest traumatic etiology. No skipped facets, no associated fracture.  Similar-appearing grade 1 anterolisthesis of C4 on C5. Similar-appearing grade 1 anterolisthesis of C7 on T1. Skull base and vertebrae: Multilevel severe degenerative changes of the spine. No acute fracture. No primary bone lesion or focal pathologic process. Soft tissues and spinal canal: No prevertebral fluid or swelling. No visible canal hematoma. Disc levels:  Maintained. Upper chest: Unremarkable. Other: None. IMPRESSION: 1. No acute intracranial abnormality. 2. No acute displaced fracture or traumatic listhesis of the cervical spine. Electronically Signed   By: Tish Frederickson M.D.   On: 06/15/2020 23:46   CT Cervical Spine Wo Contrast  Result Date: 06/15/2020 CLINICAL DATA:  unknown per staff if patient fell. Staff reports that patient has not been able to ambulate today as normal and is complaining of left hip pain and patient has an abrasion/ski EXAM: CT HEAD WITHOUT CONTRAST CT CERVICAL SPINE WITHOUT CONTRAST TECHNIQUE: Multidetector CT imaging of the head and cervical spine was performed following the standard protocol without intravenous contrast.  Multiplanar CT image reconstructions of the cervical spine were also generated. COMPARISON:  CT C-spine 10/20/2013, CT head 06/21/2019. FINDINGS: CT HEAD FINDINGS Brain: Cerebral ventricle sizes are concordant with the degree of cerebral volume loss. Patchy and confluent areas of decreased attenuation are noted throughout the deep and periventricular white matter of the cerebral hemispheres bilaterally, compatible with chronic microvascular ischemic disease. Incidentally noted left mid cranial fossa arachnoid cyst. Similar-appearing densely calcified pineal gland. No evidence of large-territorial acute infarction. No parenchymal hemorrhage. No mass lesion. No extra-axial collection. No mass effect or midline shift. No hydrocephalus. Basilar cisterns are patent. Vascular: No hyperdense vessel. Atherosclerotic calcifications are present within the cavernous internal carotid arteries. Skull: No acute fracture or focal lesion. Sinuses/Orbits: Paranasal sinuses and mastoid air cells are clear. Bilateral lens replacement. Orbits are unremarkable. Other: None. CT CERVICAL SPINE FINDINGS Alignment: Reversal of the normal cervical lordosis likely due to positioning and degenerative changes. Interval development of grade 1 anterolisthesis of C2 on C3 with no findings to suggest traumatic etiology. No skipped facets, no associated fracture. Similar-appearing grade 1 anterolisthesis of C4 on C5. Similar-appearing grade 1 anterolisthesis of C7 on T1. Skull base and vertebrae: Multilevel severe degenerative changes of the spine. No acute fracture. No primary bone lesion or focal pathologic process. Soft tissues and spinal canal: No prevertebral fluid or swelling. No visible canal hematoma. Disc levels:  Maintained. Upper chest: Unremarkable. Other: None. IMPRESSION: 1. No acute intracranial abnormality. 2. No acute displaced fracture or traumatic listhesis of the cervical spine. Electronically Signed   By: Tish Frederickson M.D.    On: 06/15/2020 23:46   DG HIPS BILAT WITH PELVIS 3-4 VIEWS  Result Date: 06/15/2020 CLINICAL DATA:  Recent fall with hip pain, initial encounter EXAM: DG HIP (WITH OR WITHOUT PELVIS) 4V BILAT COMPARISON:  07/18/2012 FINDINGS: There are fractures involving the superior and inferior pubic rami on the left adjacent to the pubic symphysis. No definitive sacral fracture is seen. Degenerative changes of the hip joints are noted. No acute fracture is seen in the proximal femurs. IMPRESSION: No hip fracture identified. Fractures through the superior and inferior pubic rami on the left. Electronically Signed   By: Alcide Clever M.D.   On: 06/15/2020 23:20   (Echo, Carotid, EGD, Colonoscopy, ERCP)    Subjective:  Patient resting in bed confused No events overnight Discharge Exam: Vitals:   06/21/20 0518 06/21/20 1200  BP: (!) 162/110 (!) 168/100  Pulse: (!) 57 (!) 57  Resp: 20 20  Temp: 97.7 F (  36.5 C) 98.5 F (36.9 C)  SpO2: 95% 94%   Vitals:   06/20/20 1339 06/20/20 2230 06/21/20 0518 06/21/20 1200  BP: (!) 165/94  (!) 162/110 (!) 168/100  Pulse: (!) 56 77 (!) 57 (!) 57  Resp: 20 19 20 20   Temp: 97.7 F (36.5 C) (!) 97.5 F (36.4 C) 97.7 F (36.5 C) 98.5 F (36.9 C)  TempSrc: Oral Axillary Axillary Axillary  SpO2: 100% 97% 95% 94%  Weight:      Height:        General: Pt is alert, awake, not in acute distress Cardiovascular: RRR, S1/S2 +, no rubs, no gallops Respiratory: CTA bilaterally, no wheezing, no rhonchi Abdominal: Soft, NT, ND, bowel sounds + Extremities: no edema, no cyanosis    The results of significant diagnostics from this hospitalization (including imaging, microbiology, ancillary and laboratory) are listed below for reference.     Microbiology: Recent Results (from the past 240 hour(s))  Respiratory Panel by RT PCR (Flu A&B, Covid) - Nasopharyngeal Swab     Status: None   Collection Time: 06/16/20 12:15 AM   Specimen: Nasopharyngeal Swab  Result Value  Ref Range Status   SARS Coronavirus 2 by RT PCR NEGATIVE NEGATIVE Final    Comment: (NOTE) SARS-CoV-2 target nucleic acids are NOT DETECTED.  The SARS-CoV-2 RNA is generally detectable in upper respiratoy specimens during the acute phase of infection. The lowest concentration of SARS-CoV-2 viral copies this assay can detect is 131 copies/mL. A negative result does not preclude SARS-Cov-2 infection and should not be used as the sole basis for treatment or other patient management decisions. A negative result may occur with  improper specimen collection/handling, submission of specimen other than nasopharyngeal swab, presence of viral mutation(s) within the areas targeted by this assay, and inadequate number of viral copies (<131 copies/mL). A negative result must be combined with clinical observations, patient history, and epidemiological information. The expected result is Negative.  Fact Sheet for Patients:  06/18/20  Fact Sheet for Healthcare Providers:  https://www.moore.com/  This test is no t yet approved or cleared by the https://www.young.biz/ FDA and  has been authorized for detection and/or diagnosis of SARS-CoV-2 by FDA under an Emergency Use Authorization (EUA). This EUA will remain  in effect (meaning this test can be used) for the duration of the COVID-19 declaration under Section 564(b)(1) of the Act, 21 U.S.C. section 360bbb-3(b)(1), unless the authorization is terminated or revoked sooner.     Influenza A by PCR NEGATIVE NEGATIVE Final   Influenza B by PCR NEGATIVE NEGATIVE Final    Comment: (NOTE) The Xpert Xpress SARS-CoV-2/FLU/RSV assay is intended as an aid in  the diagnosis of influenza from Nasopharyngeal swab specimens and  should not be used as a sole basis for treatment. Nasal washings and  aspirates are unacceptable for Xpert Xpress SARS-CoV-2/FLU/RSV  testing.  Fact Sheet for  Patients: [place]  Fact Sheet for Healthcare Providers: https://www.moore.com/  This test is not yet approved or cleared by the https://www.young.biz/ FDA and  has been authorized for detection and/or diagnosis of SARS-CoV-2 by  FDA under an Emergency Use Authorization (EUA). This EUA will remain  in effect (meaning this test can be used) for the duration of the  Covid-19 declaration under Section 564(b)(1) of the Act, 21  U.S.C. section 360bbb-3(b)(1), unless the authorization is  terminated or revoked. Performed at [facility], [address], [place], [place]   Culture, Urine     Status: Abnormal  Collection Time: 06/16/20  3:30 PM   Specimen: Urine, Catheterized  Result Value Ref Range Status   Specimen Description   Final    URINE, CATHETERIZED Performed at [facility], [address], [place], [place] [address]    Special Requests   Final    URINE, RANDOM Performed at [facility], [address], [place], [place] [address]    Culture >=100,000 COLONIES/mL ESCHERICHIA COLI (A)  Final   Report Status 06/19/2020 FINAL  Final   Organism ID, Bacteria ESCHERICHIA COLI (A)  Final      Susceptibility   Escherichia coli - MIC*    AMPICILLIN >=32 RESISTANT Resistant     CEFAZOLIN <=4 SENSITIVE Sensitive     CEFTRIAXONE <=0.25 SENSITIVE Sensitive     CIPROFLOXACIN >=4 RESISTANT Resistant     GENTAMICIN <=1 SENSITIVE Sensitive     IMIPENEM <=0.25 SENSITIVE Sensitive     NITROFURANTOIN 32 SENSITIVE Sensitive     TRIMETH/SULFA <=20 SENSITIVE Sensitive     AMPICILLIN/SULBACTAM 4 SENSITIVE Sensitive     PIP/TAZO <=4 SENSITIVE Sensitive     * >=100,000 COLONIES/mL ESCHERICHIA COLI     Labs: BNP (last 3 results) No results for input(s): BNP in the last 8760 hours. Basic Metabolic Panel: Recent Labs  Lab 06/16/20 0002 06/17/20 0413 06/18/20 0353 06/19/20 1631  NA 140 142 145 147*   K 3.6 4.0 4.6 3.6  CL 95* 103 107 111  CO2 29 30 28 27   GLUCOSE 146* 89 103* 110*  BUN 23 33* 40* 30*  CREATININE 1.06* 1.47* 1.53* 0.97  CALCIUM 9.6 8.3* 8.5* 8.6*   Liver Function Tests: Recent Labs  Lab 06/16/20 0002  AST 30  ALT 21  ALKPHOS 81  BILITOT 0.9  PROT 7.4  ALBUMIN 3.9   No results for input(s): LIPASE, AMYLASE in the last 168 hours. No results for input(s): AMMONIA in the last 168 hours. CBC: Recent Labs  Lab 06/16/20 0002 06/17/20 0413  WBC 16.0* 10.1  NEUTROABS 13.7*  --   HGB 12.5 11.1*  HCT 38.8 35.8*  MCV 96.0 99.2  PLT 215 169   Cardiac Enzymes: No results for input(s): CKTOTAL, CKMB, CKMBINDEX, TROPONINI in the last 168 hours. BNP: Invalid input(s): POCBNP CBG: No results for input(s): GLUCAP in the last 168 hours. D-Dimer No results for input(s): DDIMER in the last 72 hours. Hgb A1c No results for input(s): HGBA1C in the last 72 hours. Lipid Profile No results for input(s): CHOL, HDL, LDLCALC, TRIG, CHOLHDL, LDLDIRECT in the last 72 hours. Thyroid function studies No results for input(s): TSH, T4TOTAL, T3FREE, THYROIDAB in the last 72 hours.  Invalid input(s): FREET3 Anemia work up No results for input(s): VITAMINB12, FOLATE, FERRITIN, TIBC, IRON, RETICCTPCT in the last 72 hours. Urinalysis    Component Value Date/Time   COLORURINE YELLOW 06/16/2020 0430   APPEARANCEUR HAZY (A) 06/16/2020 0430   LABSPEC 1.011 06/16/2020 0430   PHURINE 7.0 06/16/2020 0430   GLUCOSEU NEGATIVE 06/16/2020 0430   HGBUR NEGATIVE 06/16/2020 0430   BILIRUBINUR NEGATIVE 06/16/2020 0430   KETONESUR NEGATIVE 06/16/2020 0430   PROTEINUR 30 (A) 06/16/2020 0430   UROBILINOGEN 0.2 07/14/2013 1910   NITRITE NEGATIVE 06/16/2020 0430   LEUKOCYTESUR LARGE (A) 06/16/2020 0430   Sepsis Labs Invalid input(s): PROCALCITONIN,  WBC,  LACTICIDVEN Microbiology Recent Results (from the past 240 hour(s))  Respiratory Panel by RT PCR (Flu A&B, Covid) - Nasopharyngeal  Swab     Status: None   Collection  Time: 06/16/20 12:15 AM   Specimen: Nasopharyngeal Swab  Result Value Ref Range Status   SARS Coronavirus 2 by RT PCR NEGATIVE NEGATIVE Final    Comment: (NOTE) SARS-CoV-2 target nucleic acids are NOT DETECTED.  The SARS-CoV-2 RNA is generally detectable in upper respiratoy specimens during the acute phase of infection. The lowest concentration of SARS-CoV-2 viral copies this assay can detect is 131 copies/mL. A negative result does not preclude SARS-Cov-2 infection and should not be used as the sole basis for treatment or other patient management decisions. A negative result may occur with  improper specimen collection/handling, submission of specimen other than nasopharyngeal swab, presence of viral mutation(s) within the areas targeted by this assay, and inadequate number of viral copies (<131 copies/mL). A negative result must be combined with clinical observations, patient history, and epidemiological information. The expected result is Negative.  Fact Sheet for Patients:  https://www.moore.com/  Fact Sheet for Healthcare Providers:  https://www.young.biz/  This test is no t yet approved or cleared by the [place] FDA and  has been authorized for detection and/or diagnosis of SARS-CoV-2 by FDA under an Emergency Use Authorization (EUA). This EUA will remain  in effect (meaning this test can be used) for the duration of the COVID-19 declaration under Section 564(b)(1) of the Act, 21 U.S.C. section 360bbb-3(b)(1), unless the authorization is terminated or revoked sooner.     Influenza A by PCR NEGATIVE NEGATIVE Final   Influenza B by PCR NEGATIVE NEGATIVE Final    Comment: (NOTE) The Xpert Xpress SARS-CoV-2/FLU/RSV assay is intended as an aid in  the diagnosis of influenza from Nasopharyngeal swab specimens and  should not be used as a sole basis for treatment. Nasal washings and  aspirates are  unacceptable for Xpert Xpress SARS-CoV-2/FLU/RSV  testing.  Fact Sheet for Patients: https://www.moore.com/  Fact Sheet for Healthcare Providers: https://www.young.biz/  This test is not yet approved or cleared by the [place] FDA and  has been authorized for detection and/or diagnosis of SARS-CoV-2 by  FDA under an Emergency Use Authorization (EUA). This EUA will remain  in effect (meaning this test can be used) for the duration of the  Covid-19 declaration under Section 564(b)(1) of the Act, 21  U.S.C. section 360bbb-3(b)(1), unless the authorization is  terminated or revoked. Performed at [facility], [address], [place], [place] [address]   Culture, Urine     Status: Abnormal   Collection Time: 06/16/20  3:30 PM   Specimen: Urine, Catheterized  Result Value Ref Range Status   Specimen Description   Final    URINE, CATHETERIZED Performed at [facility], [address] [place], [place] [address]    Special Requests   Final    URINE, RANDOM Performed at [facility], [address] [place], [place] [address]    Culture >=100,000 COLONIES/mL ESCHERICHIA COLI (A)  Final   Report Status 06/19/2020 FINAL  Final   Organism ID, Bacteria ESCHERICHIA COLI (A)  Final      Susceptibility   Escherichia coli - MIC*    AMPICILLIN >=32 RESISTANT Resistant     CEFAZOLIN <=4 SENSITIVE Sensitive     CEFTRIAXONE <=0.25 SENSITIVE Sensitive     CIPROFLOXACIN >=4 RESISTANT Resistant     GENTAMICIN <=1 SENSITIVE Sensitive     IMIPENEM <=0.25 SENSITIVE Sensitive     NITROFURANTOIN 32 SENSITIVE Sensitive     TRIMETH/SULFA <=20 SENSITIVE Sensitive     AMPICILLIN/SULBACTAM 4 SENSITIVE Sensitive  PIP/TAZO <=4 SENSITIVE Sensitive     * >=100,000 COLONIES/mL ESCHERICHIA COLI     Time coordinating discharge:  39 minutes  SIGNED:   Alwyn Ren, MD  [facility] 06/21/2020, 12:14 PM

## 2020-06-22 ENCOUNTER — Non-Acute Institutional Stay (SKILLED_NURSING_FACILITY): Payer: Medicare PPO | Admitting: Adult Health

## 2020-06-22 ENCOUNTER — Encounter: Payer: Self-pay | Admitting: Adult Health

## 2020-06-22 ENCOUNTER — Inpatient Hospital Stay
Admission: RE | Admit: 2020-06-22 | Discharge: 2020-06-26 | Disposition: A | Discharge status: Skilled Nursing Facility | Payer: Medicare PPO | Source: Ambulatory Visit | Attending: Internal Medicine | Admitting: Internal Medicine

## 2020-06-22 DIAGNOSIS — E876 Hypokalemia: Secondary | ICD-10-CM

## 2020-06-22 DIAGNOSIS — H40053 Ocular hypertension, bilateral: Secondary | ICD-10-CM

## 2020-06-22 DIAGNOSIS — F0393 Unspecified dementia, unspecified severity, with mood disturbance: Secondary | ICD-10-CM

## 2020-06-22 DIAGNOSIS — S32592S Other specified fracture of left pubis, sequela: Secondary | ICD-10-CM

## 2020-06-22 DIAGNOSIS — K219 Gastro-esophageal reflux disease without esophagitis: Secondary | ICD-10-CM | POA: Diagnosis not present

## 2020-06-22 DIAGNOSIS — N390 Urinary tract infection, site not specified: Secondary | ICD-10-CM | POA: Diagnosis not present

## 2020-06-22 DIAGNOSIS — F0280 Dementia in other diseases classified elsewhere without behavioral disturbance: Secondary | ICD-10-CM

## 2020-06-22 DIAGNOSIS — F32A Depression, unspecified: Secondary | ICD-10-CM

## 2020-06-22 DIAGNOSIS — I10 Essential (primary) hypertension: Secondary | ICD-10-CM

## 2020-06-22 NOTE — Progress Notes (Signed)
Location:    [facility] Room Number: 133-P Place of Service:  SNF (31)   CODE STATUS: DNR  Allergies  Allergen Reactions   Alendronate Other (See Comments)    Alendronic Acid Per MAR Reaction:  Cramping    Butazolidin [Phenylbutazone] Swelling   Codeine Nausea And Vomiting   Darvocet [Propoxyphene N-Acetaminophen] Nausea And Vomiting   Ezetimibe-Simvastatin Other (See Comments)    Reaction:  Numbness    Meperidine Nausea And Vomiting   Morphine And Related Other (See Comments)    Reaction:  Unknown    Niacin Nausea And Vomiting and Other (See Comments)    Reaction:  Bloating    Oxybutynin Chloride Other (See Comments)    Reaction:  Confusion    Prednisone Other (See Comments)    Reaction:  Confusion    Propoxyphene Nausea And Vomiting   Raloxifene Other (See Comments)    Reaction:  Cramping    Risedronate Other (See Comments)    Reaction:  Dizziness    Simvastatin Other (See Comments)    Reaction:  Bloating    Triamterene Nausea And Vomiting   Diltiazem Hcl Rash   Lansoprazole Rash   Pravastatin Rash   Sulfa Antibiotics Rash   Teriparatide Palpitations    Chief Complaint  Patient presents with   Hospitalization Follow-up    Follow-up from hospital visit on 06/15/2020-06/22/2020    HPI:  She is a 86 year old who has been hospitalized from 06-15-20 through 06-22-20. She had a fall the night before presenting to the ED and had left leg hip pain. She is a long term resident of [facility]. She was found to have a left superior inferior pubic rami fracture. She is here for short term rehab with her goal to return back to assisted living. There are no reports of pain; no reports of agitation; she does talk to herself. There are no reports of changes in appetite. She will continue to be followed for her chronic illnesses including: dementia depression hypertension   Past Medical History:  Diagnosis Date    Anxiety    Anxiety    Eye problem    GERD (gastroesophageal reflux disease)    GI hemorrhage    Glaucoma    Hypertension    Infections of kidney    Osteoporosis     Past Surgical History:  Procedure Laterality Date   brain shunt     CATARACT EXTRACTION     DILATION AND CURETTAGE OF UTERUS     EYE SURGERY     FOOT SURGERY     HERNIA REPAIR     THYROID SURGERY     TONSILLECTOMY      Social History   Socioeconomic History   Marital status: Married    Spouse name: Not on file   Number of children: Not on file   Years of education: Not on file   Highest education level: Not on file  Occupational History   Not on file  Tobacco Use   Smoking status: Never Smoker   Smokeless tobacco: Never Used  Vaping Use   Vaping Use: Never used  Substance and Sexual Activity   Alcohol use: No   Drug use: No   Sexual activity: Never  Other Topics Concern   Not on file  Social History Narrative   Not on file   Social Determinants of Health   Financial Resource Strain:    Difficulty of Paying Living Expenses: Not on file  Food  Insecurity:    Worried About Programme researcher, broadcasting/film/video in the Last Year: Not on file   [employer] of Food in the Last Year: Not on file  Transportation Needs:    Lack of Transportation (Medical): Not on file   Lack of Transportation (Non-Medical): Not on file  Physical Activity:    Days of Exercise per Week: Not on file   Minutes of Exercise per Session: Not on file  Stress:    Feeling of Stress : Not on file  Social Connections:    Frequency of Communication with Friends and Family: Not on file   Frequency of Social Gatherings with Friends and Family: Not on file   Attends Religious Services: Not on file   Active Member of Clubs or Organizations: Not on file   Attends Banker Meetings: Not on file   Marital Status: Not on file  Intimate Partner Violence:    Fear of Current or Ex-Partner: Not on file     Emotionally Abused: Not on file   Physically Abused: Not on file   Sexually Abused: Not on file   History reviewed. No pertinent family history.    VITAL SIGNS BP (!) 172/82    Pulse 70    Temp 98 F (36.7 C)    Ht 5\' 6"  (1.676 m)    Wt 160 lb (72.6 kg)    SpO2 91%    BMI 25.82 kg/m   Outpatient Encounter Medications as of 06/22/2020  Medication Sig   cholecalciferol (VITAMIN D) 1000 UNITS tablet Take 500 Units by mouth daily.    CRANBERRY PO Take 1 tablet by mouth daily. 400 mg   Ensure (ENSURE) Take 237 mLs by mouth 4 (four) times daily.   famotidine (PEPCID) 20 MG tablet Take 1 tablet (20 mg total) by mouth 2 (two) times daily.   hydrALAZINE (APRESOLINE) 25 MG tablet Take 1 tablet (25 mg total) by mouth 4 (four) times daily.   meclizine (ANTIVERT) 25 MG tablet Take 25 mg by mouth 3 (three) times daily as needed for dizziness.   mirtazapine (REMERON) 7.5 MG tablet Take 7.5 mg by mouth every evening.    Multiple Vitamins-Iron (MULTIVITAMINS WITH IRON) TABS tablet Take 1 tablet by mouth daily.   nebivolol (BYSTOLIC) 10 MG tablet Take 1 tablet (10 mg total) by mouth 2 (two) times daily.   ondansetron (ZOFRAN) 4 MG tablet Take 1 tablet (4 mg total) by mouth every 6 (six) hours as needed for nausea.   potassium chloride (K-DUR) 10 MEQ tablet Take 1 tablet (10 mEq total) by mouth daily.   sertraline (ZOLOFT) 25 MG tablet Take 25 mg by mouth daily.   telmisartan (MICARDIS) 20 MG tablet Take 20 mg by mouth daily.   timolol (TIMOPTIC) 0.5 % ophthalmic solution Place 1 drop into both eyes daily.    UNABLE TO FIND Diet: Regular   No facility-administered encounter medications on file as of 06/22/2020.     SIGNIFICANT DIAGNOSTIC EXAMS  TODAY  06-15-20: pelvic x-ray: No hip fracture identified. Fractures through the superior and inferior pubic rami on the left.  06-15-20: ct of head and cervical spine:  1. No acute intracranial abnormality. 2. No acute displaced  fracture or traumatic listhesis of the cervical spine.  06-15-20: chest x-ray: No acute cardiopulmonary disease.   LABS REVIEWED TODAY;   06-16-20: wbc 1.60; hgb 12.5; hct 38.8; mcv 96.0 plt 215; glucose 146; bun 23; creat 1.06 ;k+ 3.6; na++ 140; ca 9.6 liver normal  albumin 3.9; urine culture: e-coli 06-19-20: glucose 110; bun 30; creat 0.97; k+ 3.6; na++ 147; ca 8.6     Review of Systems  Unable to perform ROS: Dementia (unable to participate )    Physical Exam Constitutional:      General: She is not in acute distress.    Appearance: She is well-developed. She is not diaphoretic.  Neck:     Thyroid: No thyromegaly.  Cardiovascular:     Rate and Rhythm: Normal rate and regular rhythm.     Pulses: Normal pulses.     Heart sounds: Normal heart sounds.  Pulmonary:     Effort: Pulmonary effort is normal. No respiratory distress.     Breath sounds: Normal breath sounds.  Abdominal:     General: Bowel sounds are normal. There is no distension.     Palpations: Abdomen is soft.     Tenderness: There is no abdominal tenderness.  Musculoskeletal:     Cervical back: Neck supple.     Right lower leg: No edema.     Left lower leg: No edema.     Comments: Is able to move all extremities   Lymphadenopathy:     Cervical: No cervical adenopathy.  Skin:    General: Skin is warm and dry.  Neurological:     Mental Status: Mental status is at baseline.     Comments: Will not answer to name  Psychiatric:     Comments: Is talking to people who are not present.         ASSESSMENT/ PLAN:  TODAY  1. Closed fracture of multiple pubic rami left sequela: is without change: weight is 160 pounds; will continue to monitor her status  2. Urinary tract infection without hematuria unspecified site: has completed keflex will monitor   3. GERD without esophagitis: is stable will continue pepcid 20 mg twice daily   4. Hypokalemia: is stable k+ 3.6 will continue k+ 10 meq daily   5.  Increased intraocular pressure bilateral: is stable will continue timoptic daily to both eyes  6. Essential hypertension: is stable b/p 140 80 (recheck); will continue apresoline 25 mg four times daily micardis 20 mg daily bystolic 10 mg twice daily   7. Depression due to dementia: is stable will continue zoloft 25 mg daily and remeron 7.5 mg nightly   Will check cbc bmp on 06-25-20.   MD is aware of resident's narcotic use and is in agreement with current plan of care. We will attempt to wean resident as appropriate.  Synthia Innocent NP [facility]  Contact [phone] Monday through Friday 8am- 5pm  After hours call [phone]

## 2020-06-22 NOTE — Progress Notes (Signed)
Pt left the unit via stretcher. No acute distress.

## 2020-06-25 ENCOUNTER — Encounter (HOSPITAL_COMMUNITY)
Admission: RE | Admit: 2020-06-25 | Discharge: 2020-06-25 | Disposition: A | Discharge status: Home / Self Care | Payer: Medicare PPO | Source: Skilled Nursing Facility | Attending: Family Medicine | Admitting: Family Medicine

## 2020-06-25 DIAGNOSIS — I10 Essential (primary) hypertension: Secondary | ICD-10-CM | POA: Insufficient documentation

## 2020-06-25 LAB — CBC
HCT: 37 % (ref 36.0–46.0)
Hemoglobin: 11 g/dL — ABNORMAL LOW (ref 12.0–15.0)
MCH: 30.6 pg (ref 26.0–34.0)
MCHC: 29.7 g/dL — ABNORMAL LOW (ref 30.0–36.0)
MCV: 102.8 fL — ABNORMAL HIGH (ref 80.0–100.0)
Platelets: 207 10*3/uL (ref 150–400)
RBC: 3.6 MIL/uL — ABNORMAL LOW (ref 3.87–5.11)
RDW: 17.2 % — ABNORMAL HIGH (ref 11.5–15.5)
WBC: 8.6 10*3/uL (ref 4.0–10.5)
nRBC: 0 % (ref 0.0–0.2)

## 2020-06-25 LAB — BASIC METABOLIC PANEL
Anion gap: 9 (ref 5–15)
BUN: 25 mg/dL — ABNORMAL HIGH (ref 8–23)
CO2: 28 mmol/L (ref 22–32)
Calcium: 8.8 mg/dL — ABNORMAL LOW (ref 8.9–10.3)
Chloride: 111 mmol/L (ref 98–111)
Creatinine, Ser: 1.01 mg/dL — ABNORMAL HIGH (ref 0.44–1.00)
GFR, Estimated: 50 mL/min — ABNORMAL LOW (ref >60)
Glucose, Bld: 101 mg/dL — ABNORMAL HIGH (ref 70–99)
Potassium: 3.9 mmol/L (ref 3.5–5.1)
Sodium: 148 mmol/L — ABNORMAL HIGH (ref 135–145)

## 2020-06-26 ENCOUNTER — Emergency Department (HOSPITAL_COMMUNITY): Payer: Medicare PPO

## 2020-06-26 ENCOUNTER — Inpatient Hospital Stay
Admission: RE | Admit: 2020-06-26 | Discharge: 2020-08-01 | Disposition: E | Payer: Medicare PPO | Source: Ambulatory Visit | Attending: Internal Medicine | Admitting: Internal Medicine

## 2020-06-26 ENCOUNTER — Encounter: Payer: Self-pay | Admitting: Internal Medicine

## 2020-06-26 ENCOUNTER — Emergency Department (HOSPITAL_COMMUNITY)
Admission: EM | Admit: 2020-06-26 | Discharge: 2020-06-26 | Disposition: A | Discharge status: Skilled Nursing Facility | Payer: Medicare PPO | Attending: Emergency Medicine | Admitting: Emergency Medicine

## 2020-06-26 ENCOUNTER — Encounter (HOSPITAL_COMMUNITY): Payer: Self-pay | Admitting: Emergency Medicine

## 2020-06-26 ENCOUNTER — Non-Acute Institutional Stay (SKILLED_NURSING_FACILITY): Payer: Medicare PPO | Admitting: Internal Medicine

## 2020-06-26 ENCOUNTER — Other Ambulatory Visit: Payer: Self-pay

## 2020-06-26 DIAGNOSIS — I10 Essential (primary) hypertension: Secondary | ICD-10-CM | POA: Diagnosis not present

## 2020-06-26 DIAGNOSIS — W1830XA Fall on same level, unspecified, initial encounter: Secondary | ICD-10-CM | POA: Insufficient documentation

## 2020-06-26 DIAGNOSIS — S32592S Other specified fracture of left pubis, sequela: Secondary | ICD-10-CM | POA: Diagnosis not present

## 2020-06-26 DIAGNOSIS — Z79899 Other long term (current) drug therapy: Secondary | ICD-10-CM | POA: Insufficient documentation

## 2020-06-26 DIAGNOSIS — F0391 Unspecified dementia with behavioral disturbance: Secondary | ICD-10-CM | POA: Insufficient documentation

## 2020-06-26 DIAGNOSIS — S81811A Laceration without foreign body, right lower leg, initial encounter: Secondary | ICD-10-CM | POA: Insufficient documentation

## 2020-06-26 DIAGNOSIS — F01518 Vascular dementia, unspecified severity, with other behavioral disturbance: Secondary | ICD-10-CM

## 2020-06-26 DIAGNOSIS — N390 Urinary tract infection, site not specified: Secondary | ICD-10-CM | POA: Diagnosis not present

## 2020-06-26 DIAGNOSIS — R001 Bradycardia, unspecified: Secondary | ICD-10-CM | POA: Insufficient documentation

## 2020-06-26 DIAGNOSIS — N179 Acute kidney failure, unspecified: Secondary | ICD-10-CM | POA: Diagnosis not present

## 2020-06-26 DIAGNOSIS — S8991XA Unspecified injury of right lower leg, initial encounter: Secondary | ICD-10-CM | POA: Diagnosis present

## 2020-06-26 DIAGNOSIS — W19XXXA Unspecified fall, initial encounter: Secondary | ICD-10-CM

## 2020-06-26 DIAGNOSIS — F0151 Vascular dementia with behavioral disturbance: Secondary | ICD-10-CM | POA: Diagnosis not present

## 2020-06-26 DIAGNOSIS — B9620 Unspecified Escherichia coli [E. coli] as the cause of diseases classified elsewhere: Secondary | ICD-10-CM

## 2020-06-26 LAB — COMPREHENSIVE METABOLIC PANEL
ALT: 16 U/L (ref 0–44)
AST: 24 U/L (ref 15–41)
Albumin: 3.2 g/dL — ABNORMAL LOW (ref 3.5–5.0)
Alkaline Phosphatase: 107 U/L (ref 38–126)
Anion gap: 7 (ref 5–15)
BUN: 24 mg/dL — ABNORMAL HIGH (ref 8–23)
CO2: 28 mmol/L (ref 22–32)
Calcium: 8.6 mg/dL — ABNORMAL LOW (ref 8.9–10.3)
Chloride: 109 mmol/L (ref 98–111)
Creatinine, Ser: 0.98 mg/dL (ref 0.44–1.00)
GFR, Estimated: 52 mL/min — ABNORMAL LOW (ref >60)
Glucose, Bld: 111 mg/dL — ABNORMAL HIGH (ref 70–99)
Potassium: 4.1 mmol/L (ref 3.5–5.1)
Sodium: 144 mmol/L (ref 135–145)
Total Bilirubin: 0.8 mg/dL (ref 0.3–1.2)
Total Protein: 6.3 g/dL — ABNORMAL LOW (ref 6.5–8.1)

## 2020-06-26 LAB — CBC
HCT: 43.2 % (ref 36.0–46.0)
Hemoglobin: 12.8 g/dL (ref 12.0–15.0)
MCH: 30.5 pg (ref 26.0–34.0)
MCHC: 29.6 g/dL — ABNORMAL LOW (ref 30.0–36.0)
MCV: 102.9 fL — ABNORMAL HIGH (ref 80.0–100.0)
Platelets: 224 10*3/uL (ref 150–400)
RBC: 4.2 MIL/uL (ref 3.87–5.11)
RDW: 17.4 % — ABNORMAL HIGH (ref 11.5–15.5)
WBC: 11.8 10*3/uL — ABNORMAL HIGH (ref 4.0–10.5)
nRBC: 0 % (ref 0.0–0.2)

## 2020-06-26 MED ORDER — ACETAMINOPHEN 325 MG PO TABS
650.0000 mg | ORAL_TABLET | Freq: Once | ORAL | Status: DC
  Filled 2020-06-26: qty 2

## 2020-06-26 MED ORDER — LIDOCAINE-EPINEPHRINE (PF) 2 %-1:200000 IJ SOLN
20.0000 mL | Freq: Once | INTRAMUSCULAR | Status: AC
  Administered 2020-06-26: 20 mL
  Filled 2020-06-26: qty 20

## 2020-06-26 NOTE — ED Provider Notes (Signed)
[facility] EMERGENCY DEPARTMENT Provider Note   CSN: [id number] Arrival date & time: 07-25-20  1635     History Chief Complaint  Patient presents with  . Fall    Felicia Wells is a 86 y.o. female presenting for evaluation of fall.  Level 5 caveat due to dementia.  Per facility, patient had an unwitnessed fall.  She has been complaining of hip and back pain.  Patient told RN that she stood up from the wheelchair and fell.  Reports pain in her pelvis.  Additionally, patient was found to have a laceration of the right leg.  Additional history came from chart review.  History of anxiety, GERD, hypertension, and multiple pelvic rami fxs. She is not on a blood thinner.   HPI     Past Medical History:  Diagnosis Date  . Anxiety   . Anxiety   . Diverticulosis large intestine w/o perforation or abscess w/bleeding   . Eye problem   . GERD (gastroesophageal reflux disease)   . GI hemorrhage   . Glaucoma   . Hypertension   . Infections of kidney   . Lower GI bleed 04/09/2016  . Osteoporosis     Patient Active Problem List   Diagnosis Date Noted  . AKI (acute kidney injury) (HCC) 2020/07/25  . GERD without esophagitis 06/22/2020  . Hypokalemia 06/22/2020  . Increased intraocular pressure, bilateral 06/22/2020  . Depression due to dementia (HCC) 06/22/2020  . UTI (urinary tract infection) 06/17/2020  . Closed fracture of multiple pubic rami, left, sequela 06/15/2020  . Dementia with behavioral disturbance (HCC) 04/11/2016  . DNR (do not resuscitate) 09/12/2015  . Essential hypertension 07/19/2012  . Meniere's disease 07/19/2012    Past Surgical History:  Procedure Laterality Date  . brain shunt    . CATARACT EXTRACTION    . DILATION AND CURETTAGE OF UTERUS    . EYE SURGERY    . FOOT SURGERY    . HERNIA REPAIR    . THYROID SURGERY    . TONSILLECTOMY       OB History   No obstetric history on file.     History reviewed. No pertinent family  history.  Social History   Tobacco Use  . Smoking status: Never Smoker  . Smokeless tobacco: Never Used  Vaping Use  . Vaping Use: Never used  Substance Use Topics  . Alcohol use: No  . Drug use: No    Home Medications Prior to Admission medications   Medication Sig Start Date End Date Taking? Authorizing Provider  cholecalciferol (VITAMIN D) 1000 UNITS tablet Take 500 Units by mouth daily.     [provider]  CRANBERRY PO Take 1 tablet by mouth daily. 400 mg    [provider]  Ensure (ENSURE) Take 237 mLs by mouth 4 (four) times daily.    [provider]  famotidine (PEPCID) 20 MG tablet Take 1 tablet (20 mg total) by mouth 2 (two) times daily. 09/14/15   Avon Gully, MD  hydrALAZINE (APRESOLINE) 25 MG tablet Take 1 tablet (25 mg total) by mouth 4 (four) times daily. 06/21/20 06/21/21  Alwyn Ren, MD  meclizine (ANTIVERT) 25 MG tablet Take 25 mg by mouth 3 (three) times daily as needed for dizziness.    [provider]  mirtazapine (REMERON) 7.5 MG tablet Take 7.5 mg by mouth every evening.  06/12/20   [provider]  Multiple Vitamins-Iron (MULTIVITAMINS WITH IRON) TABS tablet Take 1 tablet by mouth daily.  [provider]  nebivolol (BYSTOLIC) 10 MG tablet Take 1 tablet (10 mg total) by mouth 2 (two) times daily. 06/21/20   Alwyn RenMathews, Elizabeth G, MD  ondansetron (ZOFRAN) 4 MG tablet Take 1 tablet (4 mg total) by mouth every 6 (six) hours as needed for nausea. 06/21/20   Alwyn RenMathews, Elizabeth G, MD  Polyethyl Glycol-Propyl Glycol (SYSTANE ULTRA) 0.4-0.3 % SOLN Place 1 drop into both eyes 4 (four) times daily. 06/23/20   [provider]  potassium chloride (K-DUR) 10 MEQ tablet Take 1 tablet (10 mEq total) by mouth daily. 12/04/12   Kari BaarsHawkins, Edward, MD  sertraline (ZOLOFT) 25 MG tablet Take 25 mg by mouth daily. 06/07/20   [provider]  telmisartan (MICARDIS) 20 MG tablet Take 20 mg by mouth daily.     [provider]  timolol (TIMOPTIC) 0.5 % ophthalmic solution Place 1 drop into both eyes daily.  05/16/20   [provider]  UNABLE TO FIND Diet Change: Dys 2 (ground) with dys 1 (puree) meats, thin liquids (NO STRAWS) 06/25/20   [provider]    Allergies    Alendronate, Butazolidin [phenylbutazone], Codeine, Darvocet [propoxyphene n-acetaminophen], Ezetimibe-simvastatin, Meperidine, Morphine and related, Niacin, Oxybutynin chloride, Prednisone, Propoxyphene, Raloxifene, Risedronate, Simvastatin, Triamterene, Diltiazem hcl, Lansoprazole, Pravastatin, Sulfa antibiotics, and Teriparatide  Review of Systems   Review of Systems  Unable to perform ROS: Dementia  Musculoskeletal: Positive for arthralgias and back pain.  Skin: Positive for wound.    Physical Exam Updated Vital Signs BP 128/78   Pulse (!) 46   Temp 98 F (36.7 C) (Oral)   Resp (!) 24   SpO2 94%   Physical Exam Vitals and nursing note reviewed.  Constitutional:      General: She is not in acute distress.    Appearance: She is well-developed.     Comments: Elderly female in NAD  HENT:     Head: Normocephalic and atraumatic.  Eyes:     Conjunctiva/sclera: Conjunctivae normal.     Pupils: Pupils are equal, round, and reactive to light.  Cardiovascular:     Rate and Rhythm: Bradycardia present. Rhythm irregular.     Pulses: Normal pulses.     Comments: Irregular heart rate.  Intermittently bradycardic Pulmonary:     Effort: Pulmonary effort is normal. No respiratory distress.     Breath sounds: Normal breath sounds. No wheezing.  Abdominal:     General: There is no distension.     Palpations: Abdomen is soft. There is no mass.     Tenderness: There is no abdominal tenderness. There is no guarding or rebound.  Musculoskeletal:        General: Normal range of motion.     Cervical back: Normal range of motion and neck supple.     Comments: Tenderness palpation of the pelvis, however no  pain with palpation of the lateral hips.  No shortening or rotation of lower extremities.  Skin:    General: Skin is warm and dry.     Capillary Refill: Capillary refill takes less than 2 seconds.     Comments: Large wound of the right lateral lower leg without active bleeding.  Exposed fatty tissue.  Neurological:     Mental Status: She is alert and oriented to person, place, and time.     ED Results / Procedures / Treatments   Labs (all labs ordered are listed, but only abnormal results are displayed) Labs Reviewed  CBC - Abnormal; Notable for the following components:  Result Value   WBC 11.8 (*)    MCV 102.9 (*)    MCHC 29.6 (*)    RDW 17.4 (*)    All other components within normal limits  COMPREHENSIVE METABOLIC PANEL - Abnormal; Notable for the following components:   Glucose, Bld 111 (*)    BUN 24 (*)    Calcium 8.6 (*)    Total Protein 6.3 (*)    Albumin 3.2 (*)    GFR, Estimated 52 (*)    All other components within normal limits    EKG EKG Interpretation  Date/Time:  Tuesday June 26 2020 17:05:51 EDT Ventricular Rate:  47 PR Interval:    QRS Duration: 89 QT Interval:  466 QTC Calculation: 412 R Axis:   -56 Text Interpretation: Sinus bradycardia Atrial premature complexes in couplets Short PR interval Abnormal R-wave progression, early transition Inferior infarct, old Confirmed by Kennis Carina [id number]) on 06/18/2020 5:17:38 PM   Radiology DG Lumbar Spine Complete  Result Date: 06/30/2020 CLINICAL DATA:  Fall with back pain EXAM: LUMBAR SPINE - COMPLETE 4+ VIEW COMPARISON:  06/15/2020 FINDINGS: Limited by positioning and scoliosis. Incompletely visualized left pubic rami fractures, displaced. Vertebral body heights are grossly maintained. Moderate severe degenerative changes throughout the lumbar spine with disc space narrowing, osteophyte, and facet degenerative changes. Aortic atherosclerosis. IMPRESSION: Limited by positioning and scoliosis.  Degenerative changes without definitive acute osseous abnormality. Incompletely visualized left pubic rami fractures. Electronically Signed   By: Jasmine Pang M.D.   On: 06/16/2020 18:00   CT Head Wo Contrast  Result Date: 06/03/2020 CLINICAL DATA:  Head trauma fall laceration EXAM: CT HEAD WITHOUT CONTRAST TECHNIQUE: Contiguous axial images were obtained from the base of the skull through the vertex without intravenous contrast. COMPARISON:  CT brain 06/15/2020 FINDINGS: Brain: No acute territorial infarction, hemorrhage or intracranial mass. Mild atrophy. Extensive white matter hypodensity consistent with chronic small vessel ischemic change. Nonenlarged ventricles. Vascular: No hyperdense vessels.  Carotid vascular calcification. Skull: Normal. Negative for fracture or focal lesion. Sinuses/Orbits: No acute finding. Postsurgical changes of the left mastoid with small amount of fluid Other: None IMPRESSION: 1. No CT evidence for acute intracranial abnormality. 2. Atrophy and chronic small vessel ischemic changes of the white matter. Electronically Signed   By: Jasmine Pang M.D.   On: 06/05/2020 18:09   DG Hips Bilat W or Wo Pelvis 3-4 Views  Result Date: 06/11/2020 CLINICAL DATA:  86 year old female with fall. EXAM: DG HIP (WITH OR WITHOUT PELVIS) 3-4V BILAT COMPARISON:  Radiograph dated 06/15/2020. FINDINGS: Evaluation for fracture is limited due to osteopenia and body habitus. No definite acute fracture or dislocation. Apparent lucency along the medial aspect of the greater trochanter of the left femur on the AP view is not visualized on the other views, likely artifactual. Clinical correlation is recommended. Osteopenia and moderate bilateral hip arthritic changes. The soft tissues are grossly unremarkable. IMPRESSION: 1. No definite acute fracture or dislocation. 2. Osteopenia with moderate bilateral hip arthritic changes. Electronically Signed   By: Elgie Collard M.D.   On: 06/10/2020 18:06     Procedures .Marland KitchenLaceration Repair  Date/Time: 06/01/2020 6:40 PM Performed by: Alveria Apley, PA-C Authorized by: Alveria Apley, PA-C   Consent:    Consent obtained:  Verbal   Consent given by:  Patient   Risks discussed:  Infection, need for additional repair, nerve damage and poor wound healing Anesthesia (see MAR for exact dosages):    Anesthesia method:  Local infiltration   Local anesthetic:  Lidocaine 2% WITH epi Laceration details:    Location:  Leg   Leg location:  R lower leg   Length (cm):  5   Depth (mm):  5 Repair type:    Repair type:  Intermediate Pre-procedure details:    Preparation:  Patient was prepped and draped in usual sterile fashion Exploration:    Hemostasis achieved with:  Direct pressure   Wound exploration: wound explored through full range of motion and entire depth of wound probed and visualized     Wound extent: no foreign bodies/material noted and no vascular damage noted   Treatment:    Area cleansed with:  Saline   Amount of cleaning:  Standard Subcutaneous repair:    Suture size:  5-0   Suture material:  Fast-absorbing gut   Suture technique:  Simple interrupted   Number of sutures:  3 Skin repair:    Repair method:  Steri-Strips   Number of Steri-Strips:  18 Approximation:    Approximation:  Close Post-procedure details:    Dressing:  Sterile dressing   Patient tolerance of procedure:  Tolerated well, no immediate complications   (including critical care time)  Medications Ordered in ED Medications  acetaminophen (TYLENOL) tablet 650 mg (650 mg Oral Refused 06/23/2020 1823)  lidocaine-EPINEPHrine (XYLOCAINE W/EPI) 2 %-1:200000 (PF) injection 20 mL (20 mLs Infiltration Given by Other 06/03/2020 1825)    ED Course  I have reviewed the triage vital signs and the nursing notes.  Pertinent labs & imaging results that were available during my care of the patient were reviewed by me and considered in my medical decision making  (see chart for details).    MDM Rules/Calculators/A&P                          Patient presenting for evaluation after an unwitnessed fall.  History of dementia, limiting history and physical.  As such, will obtain a CT head as I do not know patient hit her head.  Basic labs and EKG ordered due to bradycardia.  Will obtain imaging of the lumbar spine and pelvis, systems where patient reports pain.  Patient's laceration will need to be repaired, although may be tricky due to thinness of skin. discussed with attending, Dr. Pilar Plate evaluated the pt.   Ct head negative for acute findings. xrays negative for acute findings. Leg lac repaired as described above. Of note, pt's HR continues to be irreg, but mostly above 50, which appears to be baseline. Will have pt's PCP continue to follow. At this time, pt appears safe for d/c. Return precautions given.  Final Clinical Impression(s) / ED Diagnoses Final diagnoses:  Fall, initial encounter  Bradycardia  Laceration of right lower extremity, initial encounter    Rx / DC Orders ED Discharge Orders    None       Alveria Apley, PA-C 06/25/2020 1851    Sabas Sous, MD 06/04/2020 2008

## 2020-06-26 NOTE — ED Triage Notes (Signed)
Pt brought over from [facility] after fall. [facility] reports unwitnessed fall, laceration noted to RLE. Pt reports lower back pain. Pt seen for hip fx on 10/15. Facility sent pt over for laceration repair. Pt noted to be bradycardic at time of arrival to ED.

## 2020-06-26 NOTE — Assessment & Plan Note (Signed)
PT/OT as tolerated @ SNF 

## 2020-06-26 NOTE — Patient Instructions (Signed)
See assessment and plan under each diagnosis in the problem list and acutely for this visit 

## 2020-06-26 NOTE — Assessment & Plan Note (Addendum)
10/15-10/21/2021 admitted with acute pubic rami fracture on the left sustained in unwitnessed fall.  AKI in the context of decreased p.o. intake and dehydration.  Creatinine peaked at 1.53 with a GFR 28 indicating CKD D stage IIIb.  With rehydration creatinine 1.01 and GFR 50.  Avoid nephrotoxic drugs.

## 2020-06-26 NOTE — Assessment & Plan Note (Signed)
Mental status worsened during hospitalization for acute pubic rami fracture in the context of acute E. coli UTI and AKI.

## 2020-06-26 NOTE — Progress Notes (Signed)
   NURSING HOME LOCATION:  [facility] ROOM NUMBER: 133/P   CODE STATUS:DNR  PCP:  Kari Baars, MD  This is a comprehensive admission note to [facility] performed on this date less than 30 days from date of admission. Included are preadmission medical/surgical history; reconciled medication list; family history; social history and comprehensive review of systems.  Corrections and additions to the records were documented. Comprehensive physical exam was also performed. Additionally a clinical summary was entered for each active diagnosis pertinent to this admission in the Problem List to enhance continuity of care.  HPI: Patient was hospitalized 10/15-10/21/2021 admitted from memory care with complaints of hip pain.  Apparently the patient reportedly fell on 10/14 and subsequently had difficulty ambulating. Imaging revealed pubic rami fracture on the left. There was worsening mental status in the context of the longstanding dementia and a UTI and AKI in the context of decreased p.o. intake and dehydration.  Initially Lasix was on hold due to the AKI.Hospital course was also complicated by E. coli UTI for which she received Keflex.  Palliative care consulted and will continue to follow the patient.  Past medical and surgical history: Includes history of osteoporosis, essential hypertension, glaucoma, GERD, and anxiety. PMH includes thyroid surgery.  Social history:non drinker;never smoked.  Family history: Non contributory due to age.   Review of systems:  Could not be completed due to dementia. Date given as "?, ??, 2000 or 2012".President named as Careers information officer". ROS negative except "I was puny yesterday.". She denies any pain. She became very agitated about my wearing a mask , calling it a "protector". She repeatedly asked why I was there.  She wanted "the woman" to come back, referring to the NP who had seen her most recently.  She kept saying "you are not the one I am  supposed to see".  She went on to reply "words will not come out" as I asked additional questions.  Physical exam:  Pertinent or positive findings: She appears younger than her stated age.  She is hard of hearing.  Hair is disheveled.  Initially she was repeatedly scratching right anterior scalp.  There were no visible excoriations or abrasions although she does have diffuse keratotic lesions of the forehead and trunk. Ptosis present. Left nasolabial fold was slightly decreased.  She has 1/2+ edema at the sock line.  Pedal pulses are decreased.  General appearance: Adequately nourished; no acute distress, increased work of breathing is present.   Lymphatic: No lymphadenopathy about the head, neck, axilla. Eyes: No conjunctival inflammation or lid edema is present. There is no scleral icterus. Ears:  External ear exam shows no significant lesions or deformities.   Nose:  External nasal examination shows no deformity or inflammation. Nasal mucosa are pink and moist without lesions, exudates Oral exam: Lips and gums are healthy appearing.There is no oropharyngeal erythema or exudate. Neck:  No thyromegaly, masses, tenderness noted.    Heart:  Normal rate and regular rhythm. S1 and S2 normal without gallop, murmur, click, rub.  Lungs: Chest clear to auscultation without wheezes, rhonchi, rales, rubs. Abdomen: Bowel sounds are normal.  Abdomen is soft and nontender with no organomegaly, hernias, masses. GU: Deferred  Extremities:  No cyanosis, clubbing. Neurologic exam:  Balance, Rhomberg, finger to nose testing could not be completed due to clinical state Skin: Warm & dry w/o tenting. No significant  rash.  See clinical summary under each active problem in the Problem List with associated updated therapeutic plan

## 2020-06-26 NOTE — Discharge Instructions (Signed)
Your x-rays did not show any new injury or fracture. Follow-up with your PCP as needed. Use Tylenol as needed for pain. Try to avoid getting your leg wet for the first 2 days feel the Steri-Strips are on. If your leg gets wet, pat dry, do not vigorously rub as this will cause a Steri-Strips to fall off. Your Steri-Strips will fall off in several days on their own, do not pick at them or pull them. Your heart rate was found to be low in the ER.  This appears similar to previous, however you should follow-up with your primary care doctor regarding this. Return to the emergency room with any new, worsening, concerning symptoms.

## 2020-06-28 ENCOUNTER — Encounter: Payer: Self-pay | Admitting: Adult Health

## 2020-06-28 ENCOUNTER — Non-Acute Institutional Stay (SKILLED_NURSING_FACILITY): Payer: Medicare PPO | Admitting: Adult Health

## 2020-06-28 DIAGNOSIS — F0151 Vascular dementia with behavioral disturbance: Secondary | ICD-10-CM

## 2020-06-28 DIAGNOSIS — I10 Essential (primary) hypertension: Secondary | ICD-10-CM

## 2020-06-28 DIAGNOSIS — R001 Bradycardia, unspecified: Secondary | ICD-10-CM | POA: Diagnosis not present

## 2020-06-28 DIAGNOSIS — F01518 Vascular dementia, unspecified severity, with other behavioral disturbance: Secondary | ICD-10-CM

## 2020-06-28 NOTE — Progress Notes (Signed)
Location:    [facility] Room Number: 133/P Place of Service:  SNF (31)   CODE STATUS: DNR  Allergies  Allergen Reactions   Alendronate Other (See Comments)    Alendronic Acid Per MAR Reaction:  Cramping    Butazolidin [Phenylbutazone] Swelling   Codeine Nausea And Vomiting   Darvocet [Propoxyphene N-Acetaminophen] Nausea And Vomiting   Ezetimibe-Simvastatin Other (See Comments)    Reaction:  Numbness    Meperidine Nausea And Vomiting   Morphine And Related Other (See Comments)    Reaction:  Unknown    Niacin Nausea And Vomiting and Other (See Comments)    Reaction:  Bloating    Oxybutynin Chloride Other (See Comments)    Reaction:  Confusion    Prednisone Other (See Comments)    Reaction:  Confusion    Propoxyphene Nausea And Vomiting   Raloxifene Other (See Comments)    Reaction:  Cramping    Risedronate Other (See Comments)    Reaction:  Dizziness    Simvastatin Other (See Comments)    Reaction:  Bloating    Triamterene Nausea And Vomiting   Diltiazem Hcl Rash   Lansoprazole Rash   Pravastatin Rash   Sulfa Antibiotics Rash   Teriparatide Palpitations    Chief Complaint  Patient presents with   Follow-up    Follow Up ED    HPI:  Staff report that she is bradycardic with low 02 sat in the upper 80's.  There are no indications of distress present. She is taking bystolic 10 mg twice daily. There are no reports of altered mental status. No reports of fevers present.    Past Medical History:  Diagnosis Date   Anxiety    Anxiety    Diverticulosis large intestine w/o perforation or abscess w/bleeding    Eye problem    GERD (gastroesophageal reflux disease)    GI hemorrhage    Glaucoma    Hypertension    Infections of kidney    Lower GI bleed 04/09/2016   Osteoporosis     Past Surgical History:  Procedure Laterality Date   brain shunt     CATARACT EXTRACTION     DILATION AND CURETTAGE OF UTERUS       EYE SURGERY     FOOT SURGERY     HERNIA REPAIR     THYROID SURGERY     TONSILLECTOMY      Social History   Socioeconomic History   Marital status: Married    Spouse name: Not on file   Number of children: Not on file   Years of education: Not on file   Highest education level: Not on file  Occupational History   Not on file  Tobacco Use   Smoking status: Never Smoker   Smokeless tobacco: Never Used  Vaping Use   Vaping Use: Never used  Substance and Sexual Activity   Alcohol use: No   Drug use: No   Sexual activity: Never  Other Topics Concern   Not on file  Social History Narrative   Not on file   Social Determinants of Health   Financial Resource Strain:    Difficulty of Paying Living Expenses: Not on file  Food Insecurity:    Worried About Running Out of Food in the Last Year: Not on file   [employer] of Food in the Last Year: Not on file  Transportation Needs:    Lack of Transportation (Medical): Not on file   Lack of Transportation (  Non-Medical): Not on file  Physical Activity:    Days of Exercise per Week: Not on file   Minutes of Exercise per Session: Not on file  Stress:    Feeling of Stress : Not on file  Social Connections:    Frequency of Communication with Friends and Family: Not on file   Frequency of Social Gatherings with Friends and Family: Not on file   Attends Religious Services: Not on file   Active Member of Clubs or Organizations: Not on file   Attends Banker Meetings: Not on file   Marital Status: Not on file  Intimate Partner Violence:    Fear of Current or Ex-Partner: Not on file   Emotionally Abused: Not on file   Physically Abused: Not on file   Sexually Abused: Not on file   No family history on file.    VITAL SIGNS BP 125/66    Pulse 68    Temp 98 F (36.7 C)    Resp 20    Ht 5\' 4"  (1.626 m)    Wt 129 lb (58.5 kg)    SpO2 91%    BMI 22.14 kg/m   Outpatient Encounter  Medications as of 06/28/2020  Medication Sig   acetaminophen (TYLENOL) 325 MG tablet Take 650 mg by mouth every 6 (six) hours as needed.   cholecalciferol (VITAMIN D) 1000 UNITS tablet Take 500 Units by mouth daily.    CRANBERRY PO Take 1 tablet by mouth daily. 400 mg   Ensure (ENSURE) Take 237 mLs by mouth 4 (four) times daily.   famotidine (PEPCID) 20 MG tablet Take 1 tablet (20 mg total) by mouth 2 (two) times daily.   hydrALAZINE (APRESOLINE) 25 MG tablet Take 1 tablet (25 mg total) by mouth 4 (four) times daily.   meclizine (ANTIVERT) 25 MG tablet Take 25 mg by mouth 3 (three) times daily as needed for dizziness.   mirtazapine (REMERON) 7.5 MG tablet Take 7.5 mg by mouth every evening.    Multiple Vitamins-Iron (MULTIVITAMINS WITH IRON) TABS tablet Take 1 tablet by mouth daily.   [START ON 06/29/2020] nebivolol (BYSTOLIC) 5 MG tablet Take 5 mg by mouth 2 (two) times daily.   ondansetron (ZOFRAN) 4 MG tablet Take 1 tablet (4 mg total) by mouth every 6 (six) hours as needed for nausea.   OXYGEN Inhale 2 L into the lungs continuous.   Polyethyl Glycol-Propyl Glycol (SYSTANE ULTRA) 0.4-0.3 % SOLN Place 1 drop into both eyes 4 (four) times daily.   potassium chloride (K-DUR) 10 MEQ tablet Take 1 tablet (10 mEq total) by mouth daily.   sertraline (ZOLOFT) 25 MG tablet Take 25 mg by mouth daily.   telmisartan (MICARDIS) 20 MG tablet Take 20 mg by mouth daily.   timolol (TIMOPTIC) 0.5 % ophthalmic solution Place 1 drop into both eyes daily.    UNABLE TO FIND Diet Change: Dys 2 (ground) with dys 1 (puree) meats, thin liquids (NO STRAWS)   [DISCONTINUED] nebivolol (BYSTOLIC) 10 MG tablet Take 1 tablet (10 mg total) by mouth 2 (two) times daily.   No facility-administered encounter medications on file as of 06/28/2020.     SIGNIFICANT DIAGNOSTIC EXAMS  PREVIOUS  06-15-20: pelvic x-ray: No hip fracture identified. Fractures through the superior and inferior pubic rami on the  left.  06-15-20: ct of head and cervical spine:  1. No acute intracranial abnormality. 2. No acute displaced fracture or traumatic listhesis of the cervical spine.  06-15-20: chest x-ray: No acute  cardiopulmonary disease.  NO NEW EXAMS    LABS REVIEWED TODAY;   06-16-20: wbc 10.1; hgb 12.5; hct 38.8; mcv 96.0 plt 215; glucose 146; bun 23; creat 1.06 ;k+ 3.6; na++ 140; ca 9.6 liver normal albumin 3.9; urine culture: e-coli 06-19-20: glucose 110; bun 30; creat 0.97; k+ 3.6; na++ 147; ca 8.6   TODAY  06/08/2020: wbc 11.8; hgb 12.8; hct 43.2 mcv 102.9 plt 224; glucose 111; bun 24; creat 0.98; k+ 4.1; na++ 144; ca 8.6 liver normal albumin 3.2    Review of Systems  Unable to perform ROS: Dementia (unable to participate )    Physical Exam Constitutional:      General: She is not in acute distress.    Appearance: She is well-developed. She is not diaphoretic.  Neck:     Thyroid: No thyromegaly.  Cardiovascular:     Rate and Rhythm: Normal rate and regular rhythm.     Pulses: Normal pulses.     Heart sounds: Normal heart sounds.  Pulmonary:     Effort: Pulmonary effort is normal. No respiratory distress.     Breath sounds: Normal breath sounds.  Abdominal:     General: Bowel sounds are normal. There is no distension.     Palpations: Abdomen is soft.     Tenderness: There is no abdominal tenderness.  Musculoskeletal:     Cervical back: Neck supple.     Right lower leg: No edema.     Left lower leg: No edema.     Comments: Is able to move all extremities   Lymphadenopathy:     Cervical: No cervical adenopathy.  Skin:    General: Skin is warm and dry.  Neurological:     Mental Status: She is alert. Mental status is at baseline.  Psychiatric:        Mood and Affect: Mood normal.      ASSESSMENT/ PLAN:  TODAY  1. Vascular dementia with behavioral disturbance 2. Bradycardia 3. Essential hypertension   Will hold tonight's bystolic  Then begin 5 mg twice daily through  07-01-20 then d/c.  Will continue to monitor her status.  Her goal of care is for comfort only per her MOST form.  I have spoken with her family they verbalize understanding and are in agreement with this plan.   MD is aware of resident's narcotic use and is in agreement with current plan of care. We will attempt to wean resident as appropriate.  Synthia Innocent NP [facility]  Contact [phone] Monday through Friday 8am- 5pm  After hours call [phone]

## 2020-06-29 ENCOUNTER — Encounter: Payer: Self-pay | Admitting: Adult Health

## 2020-06-29 ENCOUNTER — Non-Acute Institutional Stay (SKILLED_NURSING_FACILITY): Payer: Medicare PPO | Admitting: Adult Health

## 2020-06-29 DIAGNOSIS — S32592S Other specified fracture of left pubis, sequela: Secondary | ICD-10-CM

## 2020-06-29 DIAGNOSIS — E876 Hypokalemia: Secondary | ICD-10-CM | POA: Diagnosis not present

## 2020-06-29 DIAGNOSIS — K219 Gastro-esophageal reflux disease without esophagitis: Secondary | ICD-10-CM

## 2020-06-29 NOTE — Progress Notes (Signed)
Location:    [facility] Room Number: 133-P Place of Service:  SNF (31)   CODE STATUS: DNR  Allergies  Allergen Reactions  . Alendronate Other (See Comments)    Alendronic Acid Per MAR Reaction:  Cramping   . Butazolidin [Phenylbutazone] Swelling  . Codeine Nausea And Vomiting  . Darvocet [Propoxyphene N-Acetaminophen] Nausea And Vomiting  . Ezetimibe-Simvastatin Other (See Comments)    Reaction:  Numbness   . Meperidine Nausea And Vomiting  . Morphine And Related Other (See Comments)    Reaction:  Unknown   . Niacin Nausea And Vomiting and Other (See Comments)    Reaction:  Bloating   . Oxybutynin Chloride Other (See Comments)    Reaction:  Confusion   . Prednisone Other (See Comments)    Reaction:  Confusion   . Propoxyphene Nausea And Vomiting  . Raloxifene Other (See Comments)    Reaction:  Cramping   . Risedronate Other (See Comments)    Reaction:  Dizziness   . Simvastatin Other (See Comments)    Reaction:  Bloating   . Triamterene Nausea And Vomiting  . Diltiazem Hcl Rash  . Lansoprazole Rash  . Pravastatin Rash  . Sulfa Antibiotics Rash  . Teriparatide Palpitations    Chief Complaint  Patient presents with  . Routine Follow-up         Closed fracture of multiple pubic rami left sequela:  GERD without esophagitis:  Hypokalemia  Weekly follow up for the first 30 days post hospitalization.     HPI:  She is a 86 year old short term rehab patient who is being seen for the management of her chronic illnesses: Closed fracture of multiple pubic rami left sequela:  GERD without esophagitis:  Hypokalemia.  She continues to have hallucinations and delusions which do not cause her emotional harm. There are no reports of uncontrolled pain. No reports of changes in appetite. She is tolerating coming off bystolic without difficultly.   Past Medical History:  Diagnosis Date  . Anxiety   . Anxiety   . Diverticulosis large intestine w/o perforation  or abscess w/bleeding   . Eye problem   . GERD (gastroesophageal reflux disease)   . GI hemorrhage   . Glaucoma   . Hypertension   . Infections of kidney   . Lower GI bleed 04/09/2016  . Osteoporosis     Past Surgical History:  Procedure Laterality Date  . brain shunt    . CATARACT EXTRACTION    . DILATION AND CURETTAGE OF UTERUS    . EYE SURGERY    . FOOT SURGERY    . HERNIA REPAIR    . THYROID SURGERY    . TONSILLECTOMY      Social History   Socioeconomic History  . Marital status: Married    Spouse name: Not on file  . Number of children: Not on file  . Years of education: Not on file  . Highest education level: Not on file  Occupational History  . Not on file  Tobacco Use  . Smoking status: Never Smoker  . Smokeless tobacco: Never Used  Vaping Use  . Vaping Use: Never used  Substance and Sexual Activity  . Alcohol use: No  . Drug use: No  . Sexual activity: Never  Other Topics Concern  . Not on file  Social History Narrative  . Not on file   Social Determinants of Health   Financial Resource Strain:   . Difficulty of Paying  Living Expenses: Not on file  Food Insecurity:   . Worried About Programme researcher, broadcasting/film/video in the Last Year: Not on file  . Ran Out of Food in the Last Year: Not on file  Transportation Needs:   . Lack of Transportation (Medical): Not on file  . Lack of Transportation (Non-Medical): Not on file  Physical Activity:   . Days of Exercise per Week: Not on file  . Minutes of Exercise per Session: Not on file  Stress:   . Feeling of Stress : Not on file  Social Connections:   . Frequency of Communication with Friends and Family: Not on file  . Frequency of Social Gatherings with Friends and Family: Not on file  . Attends Religious Services: Not on file  . Active Member of Clubs or Organizations: Not on file  . Attends Banker Meetings: Not on file  . Marital Status: Not on file  Intimate Partner Violence:   . Fear of Current  or Ex-Partner: Not on file  . Emotionally Abused: Not on file  . Physically Abused: Not on file  . Sexually Abused: Not on file   History reviewed. No pertinent family history.    VITAL SIGNS BP 125/66   Pulse 68   Temp 98 F (36.7 C)   Resp 20   Ht 5\' 4"  (1.626 m)   Wt 129 lb (58.5 kg)   SpO2 93%   BMI 22.14 kg/m   Outpatient Encounter Medications as of 06/29/2020  Medication Sig  . acetaminophen (TYLENOL) 325 MG tablet Take 650 mg by mouth every 6 (six) hours as needed.  . cholecalciferol (VITAMIN D) 1000 UNITS tablet Take 500 Units by mouth daily.   07/01/2020 CRANBERRY PO Take 1 tablet by mouth daily. 400 mg  . Ensure (ENSURE) Take 237 mLs by mouth 4 (four) times daily.  . famotidine (PEPCID) 20 MG tablet Take 1 tablet (20 mg total) by mouth 2 (two) times daily.  . hydrALAZINE (APRESOLINE) 25 MG tablet Take 1 tablet (25 mg total) by mouth 4 (four) times daily.  . meclizine (ANTIVERT) 25 MG tablet Take 25 mg by mouth 3 (three) times daily as needed for dizziness.  . mirtazapine (REMERON) 7.5 MG tablet Take 7.5 mg by mouth every evening.   . Multiple Vitamins-Iron (MULTIVITAMINS WITH IRON) TABS tablet Take 1 tablet by mouth daily.  . nebivolol (BYSTOLIC) 5 MG tablet Take 5 mg by mouth 2 (two) times daily.  . ondansetron (ZOFRAN) 4 MG tablet Take 1 tablet (4 mg total) by mouth every 6 (six) hours as needed for nausea.  . OXYGEN Inhale 2 L into the lungs continuous.  Marland Kitchen Glycol-Propyl Glycol (SYSTANE ULTRA) 0.4-0.3 % SOLN Place 1 drop into both eyes 4 (four) times daily.  . potassium chloride (K-DUR) 10 MEQ tablet Take 1 tablet (10 mEq total) by mouth daily.  . sertraline (ZOLOFT) 25 MG tablet Take 25 mg by mouth daily.  Bertram Gala telmisartan (MICARDIS) 20 MG tablet Take 20 mg by mouth daily.  . timolol (TIMOPTIC) 0.5 % ophthalmic solution Place 1 drop into both eyes daily.   Marland Kitchen UNABLE TO FIND Diet Change: Dys 2 (ground) with dys 1 (puree) meats, thin liquids (NO STRAWS)   No  facility-administered encounter medications on file as of 06/29/2020.     SIGNIFICANT DIAGNOSTIC EXAMS   PREVIOUS  06-15-20: pelvic x-ray: No hip fracture identified. Fractures through the superior and inferior pubic rami on the left.  06-15-20: ct of head  and cervical spine:  1. No acute intracranial abnormality. 2. No acute displaced fracture or traumatic listhesis of the cervical spine.  06-15-20: chest x-ray: No acute cardiopulmonary disease.  NO NEW EXAMS    LABS REVIEWED TODAY;   06-16-20: wbc 10.1; hgb 12.5; hct 38.8; mcv 96.0 plt 215; glucose 146; bun 23; creat 1.06 ;k+ 3.6; na++ 140; ca 9.6 liver normal albumin 3.9; urine culture: e-coli 06-19-20: glucose 110; bun 30; creat 0.97; k+ 3.6; na++ 147; ca 8.6  06/04/2020: wbc 11.8; hgb 12.8; hct 43.2 mcv 102.9 plt 224; glucose 111; bun 24; creat 0.98; k+ 4.1; na++ 144; ca 8.6 liver normal albumin 3.2    NO NEW LABS.   Review of Systems  Unable to perform ROS: Dementia (unable to participate )   Physical Exam Constitutional:      General: She is not in acute distress.    Appearance: She is well-developed. She is not diaphoretic.  Neck:     Thyroid: No thyromegaly.  Cardiovascular:     Rate and Rhythm: Normal rate and regular rhythm.     Pulses: Normal pulses.     Heart sounds: Normal heart sounds.  Pulmonary:     Effort: Pulmonary effort is normal. No respiratory distress.     Breath sounds: Normal breath sounds.  Abdominal:     General: Bowel sounds are normal. There is no distension.     Palpations: Abdomen is soft.     Tenderness: There is no abdominal tenderness.  Musculoskeletal:        General: Normal range of motion.     Cervical back: Neck supple.     Comments: 06-15-20: Fractures through the superior and inferior pubic rami on the left.  Lymphadenopathy:     Cervical: No cervical adenopathy.  Skin:    General: Skin is warm and dry.  Neurological:     Mental Status: She is alert. Mental status is at  baseline.  Psychiatric:        Mood and Affect: Mood normal.       ASSESSMENT/ PLAN:  TODAY  1. Closed fracture of multiple pubic rami left sequela: is stable will continue therapy as directed; has tylenol 650 mg every 6 hours as needed  2. GERD without esophagitis: is stable will continue pepcid 20 mg twice daily   3. Hypokalemia: is stable k+ 4.1 will continue k+ 10 meq daily   PREVIOUS   4. Increased intraocular pressure bilateral: is stable will continue timoptic daily to both eyes  5. Essential hypertension: is stable b/p 125/66; will continue apresoline 25 mg four times daily micardis 20 mg daily is being weaned off bystolic due to bradycardia; will need to avoid beta blockers in the future.    6. Depression due to dementia: is stable will continue zoloft 25 mg daily and remeron 7.5 mg nightly   7. Vascular dementia with behavioral disturbance: is without change: weight is 122 pounds; will continue to monitor her status.    MD is aware of resident's narcotic use and is in agreement with current plan of care. We will attempt to wean resident as appropriate.  Synthia Innocent NP [facility]  Contact [phone] Monday through Friday 8am- 5pm  After hours call [phone]

## 2020-07-02 ENCOUNTER — Non-Acute Institutional Stay (SKILLED_NURSING_FACILITY): Payer: Medicare PPO | Admitting: Adult Health

## 2020-07-02 ENCOUNTER — Encounter: Payer: Self-pay | Admitting: Adult Health

## 2020-07-02 DIAGNOSIS — F0280 Dementia in other diseases classified elsewhere without behavioral disturbance: Secondary | ICD-10-CM | POA: Diagnosis not present

## 2020-07-02 DIAGNOSIS — F32A Depression, unspecified: Secondary | ICD-10-CM

## 2020-07-02 DIAGNOSIS — F0393 Unspecified dementia, unspecified severity, with mood disturbance: Secondary | ICD-10-CM

## 2020-07-02 DIAGNOSIS — R001 Bradycardia, unspecified: Secondary | ICD-10-CM | POA: Insufficient documentation

## 2020-07-02 NOTE — Progress Notes (Signed)
Location:    [facility] Room Number: 133/P Place of Service:  SNF (31)   CODE STATUS: DNR  Allergies  Allergen Reactions  . Alendronate Other (See Comments)    Alendronic Acid Per MAR Reaction:  Cramping   . Butazolidin [Phenylbutazone] Swelling  . Codeine Nausea And Vomiting  . Darvocet [Propoxyphene N-Acetaminophen] Nausea And Vomiting  . Ezetimibe-Simvastatin Other (See Comments)    Reaction:  Numbness   . Meperidine Nausea And Vomiting  . Morphine And Related Other (See Comments)    Reaction:  Unknown   . Niacin Nausea And Vomiting and Other (See Comments)    Reaction:  Bloating   . Oxybutynin Chloride Other (See Comments)    Reaction:  Confusion   . Prednisone Other (See Comments)    Reaction:  Confusion   . Propoxyphene Nausea And Vomiting  . Raloxifene Other (See Comments)    Reaction:  Cramping   . Risedronate Other (See Comments)    Reaction:  Dizziness   . Simvastatin Other (See Comments)    Reaction:  Bloating   . Triamterene Nausea And Vomiting  . Diltiazem Hcl Rash  . Lansoprazole Rash  . Pravastatin Rash  . Sulfa Antibiotics Rash  . Teriparatide Palpitations    Chief Complaint  Patient presents with  . Acute Visit    Restless    HPI:  Staff reports that for the past several days she has been more restless; picking at her skin; and is having insomnia. She will have to at times be around the nursing station for her safety. There are no reports of changes in appetite; no reports of changes in her urine.   Past Medical History:  Diagnosis Date  . Anxiety   . Anxiety   . Diverticulosis large intestine w/o perforation or abscess w/bleeding   . Eye problem   . GERD (gastroesophageal reflux disease)   . GI hemorrhage   . Glaucoma   . Hypertension   . Infections of kidney   . Lower GI bleed 04/09/2016  . Osteoporosis     Past Surgical History:  Procedure Laterality Date  . brain shunt    . CATARACT EXTRACTION    .  DILATION AND CURETTAGE OF UTERUS    . EYE SURGERY    . FOOT SURGERY    . HERNIA REPAIR    . THYROID SURGERY    . TONSILLECTOMY      Social History   Socioeconomic History  . Marital status: Married    Spouse name: Not on file  . Number of children: Not on file  . Years of education: Not on file  . Highest education level: Not on file  Occupational History  . Not on file  Tobacco Use  . Smoking status: Never Smoker  . Smokeless tobacco: Never Used  Vaping Use  . Vaping Use: Never used  Substance and Sexual Activity  . Alcohol use: No  . Drug use: No  . Sexual activity: Never  Other Topics Concern  . Not on file  Social History Narrative  . Not on file   Social Determinants of Health   Financial Resource Strain:   . Difficulty of Paying Living Expenses: Not on file  Food Insecurity:   . Worried About Programme researcher, broadcasting/film/video in the Last Year: Not on file  . Ran Out of Food in the Last Year: Not on file  Transportation Needs:   . Lack of Transportation (Medical): Not on file  .  Lack of Transportation (Non-Medical): Not on file  Physical Activity:   . Days of Exercise per Week: Not on file  . Minutes of Exercise per Session: Not on file  Stress:   . Feeling of Stress : Not on file  Social Connections:   . Frequency of Communication with Friends and Family: Not on file  . Frequency of Social Gatherings with Friends and Family: Not on file  . Attends Religious Services: Not on file  . Active Member of Clubs or Organizations: Not on file  . Attends Banker Meetings: Not on file  . Marital Status: Not on file  Intimate Partner Violence:   . Fear of Current or Ex-Partner: Not on file  . Emotionally Abused: Not on file  . Physically Abused: Not on file  . Sexually Abused: Not on file   No family history on file.    VITAL SIGNS BP (!) 144/117   Pulse (!) 53   Temp 97.9 F (36.6 C)   Resp 20   Ht 5\' 4"  (1.626 m)   Wt 122 lb 9.6 oz (55.6 kg)   SpO2  95%   BMI 21.04 kg/m    Blood pressure recheck 1303/70  Outpatient Encounter Medications as of 07/02/2020  Medication Sig  . acetaminophen (TYLENOL) 325 MG tablet Take 650 mg by mouth every 6 (six) hours as needed.  . cholecalciferol (VITAMIN D) 1000 UNITS tablet Take 500 Units by mouth daily.   13/09/2019 CRANBERRY PO Take 1 tablet by mouth daily. 400 mg  . Ensure (ENSURE) Take 237 mLs by mouth 4 (four) times daily.  . famotidine (PEPCID) 20 MG tablet Take 1 tablet (20 mg total) by mouth 2 (two) times daily.  . hydrALAZINE (APRESOLINE) 25 MG tablet Take 1 tablet (25 mg total) by mouth 4 (four) times daily.  . meclizine (ANTIVERT) 25 MG tablet Take 25 mg by mouth 3 (three) times daily as needed for dizziness.  . mirtazapine (REMERON) 7.5 MG tablet Take 7.5 mg by mouth every evening.   . Multiple Vitamins-Iron (MULTIVITAMINS WITH IRON) TABS tablet Take 1 tablet by mouth daily.  . ondansetron (ZOFRAN) 4 MG tablet Take 1 tablet (4 mg total) by mouth every 6 (six) hours as needed for nausea.  . OXYGEN Inhale 2 L into the lungs continuous.  Marland Kitchen Glycol-Propyl Glycol (SYSTANE ULTRA) 0.4-0.3 % SOLN Place 1 drop into both eyes 4 (four) times daily.  . potassium chloride (K-DUR) 10 MEQ tablet Take 1 tablet (10 mEq total) by mouth daily.  . sertraline (ZOLOFT) 25 MG tablet Take 25 mg by mouth daily.  Bertram Gala telmisartan (MICARDIS) 20 MG tablet Take 20 mg by mouth daily.  . timolol (TIMOPTIC) 0.5 % ophthalmic solution Place 1 drop into both eyes daily.   Marland Kitchen UNABLE TO FIND Diet Change: Dys 2 (ground) with dys 1 (puree) meats, thin liquids (NO STRAWS)  . [DISCONTINUED] nebivolol (BYSTOLIC) 5 MG tablet Take 5 mg by mouth 2 (two) times daily.   No facility-administered encounter medications on file as of 07/02/2020.     SIGNIFICANT DIAGNOSTIC EXAMS   PREVIOUS  06-15-20: pelvic x-ray: No hip fracture identified. Fractures through the superior and inferior pubic rami on the left.  06-15-20: ct of head and  cervical spine:  1. No acute intracranial abnormality. 2. No acute displaced fracture or traumatic listhesis of the cervical spine.  06-15-20: chest x-ray: No acute cardiopulmonary disease.  NO NEW EXAMS    LABS REVIEWED TODAY;   06-16-20:  wbc 10.1; hgb 12.5; hct 38.8; mcv 96.0 plt 215; glucose 146; bun 23; creat 1.06 ;k+ 3.6; na++ 140; ca 9.6 liver normal albumin 3.9; urine culture: e-coli 06-19-20: glucose 110; bun 30; creat 0.97; k+ 3.6; na++ 147; ca 8.6  06/15/2020: wbc 11.8; hgb 12.8; hct 43.2 mcv 102.9 plt 224; glucose 111; bun 24; creat 0.98; k+ 4.1; na++ 144; ca 8.6 liver normal albumin 3.2    NO NEW LABS.   Review of Systems  Unable to perform ROS: Dementia (unable to participate )     Physical Exam Constitutional:      General: She is not in acute distress.    Appearance: She is well-developed. She is not diaphoretic.  Neck:     Thyroid: No thyromegaly.  Cardiovascular:     Rate and Rhythm: Normal rate and regular rhythm.     Pulses: Normal pulses.     Heart sounds: Normal heart sounds.  Pulmonary:     Effort: Pulmonary effort is normal. No respiratory distress.     Breath sounds: Normal breath sounds.  Abdominal:     General: Bowel sounds are normal. There is no distension.     Palpations: Abdomen is soft.     Tenderness: There is no abdominal tenderness.  Musculoskeletal:        General: Normal range of motion.     Cervical back: Neck supple.     Right lower leg: No edema.     Left lower leg: No edema.     Comments: 06-15-20: Fractures through the superior and inferior pubic rami on the left.   Lymphadenopathy:     Cervical: No cervical adenopathy.  Skin:    General: Skin is warm and dry.  Neurological:     Mental Status: She is alert. Mental status is at baseline.  Psychiatric:     Comments: Is restless         ASSESSMENT/ PLAN:  TODAY  1. 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 Will continue to monitor her status      MD is aware of resident's narcotic use and is in agreement with current plan of care. We will attempt to wean resident as appropriate.  Synthia Innocent NP [facility]  Contact [phone] Monday through Friday 8am- 5pm  After hours call [phone]

## 2020-07-02 DEATH — deceased

## 2020-07-05 ENCOUNTER — Encounter: Payer: Self-pay | Admitting: Adult Health

## 2020-07-05 ENCOUNTER — Non-Acute Institutional Stay (SKILLED_NURSING_FACILITY): Payer: Medicare PPO | Admitting: Adult Health

## 2020-07-05 DIAGNOSIS — F0151 Vascular dementia with behavioral disturbance: Secondary | ICD-10-CM | POA: Diagnosis not present

## 2020-07-05 DIAGNOSIS — R627 Adult failure to thrive: Secondary | ICD-10-CM

## 2020-07-05 DIAGNOSIS — L03115 Cellulitis of right lower limb: Secondary | ICD-10-CM | POA: Diagnosis not present

## 2020-07-05 DIAGNOSIS — S32592S Other specified fracture of left pubis, sequela: Secondary | ICD-10-CM | POA: Diagnosis not present

## 2020-07-05 DIAGNOSIS — F01518 Vascular dementia, unspecified severity, with other behavioral disturbance: Secondary | ICD-10-CM

## 2020-07-05 NOTE — Progress Notes (Signed)
Location:    [facility] Room Number: 133/P Place of Service:  SNF (31)   CODE STATUS: DNR  Allergies  Allergen Reactions  . Alendronate Other (See Comments)    Alendronic Acid Per MAR Reaction:  Cramping   . Butazolidin [Phenylbutazone] Swelling  . Codeine Nausea And Vomiting  . Darvocet [Propoxyphene N-Acetaminophen] Nausea And Vomiting  . Ezetimibe-Simvastatin Other (See Comments)    Reaction:  Numbness   . Meperidine Nausea And Vomiting  . Morphine And Related Other (See Comments)    Reaction:  Unknown   . Niacin Nausea And Vomiting and Other (See Comments)    Reaction:  Bloating   . Oxybutynin Chloride Other (See Comments)    Reaction:  Confusion   . Prednisone Other (See Comments)    Reaction:  Confusion   . Propoxyphene Nausea And Vomiting  . Raloxifene Other (See Comments)    Reaction:  Cramping   . Risedronate Other (See Comments)    Reaction:  Dizziness   . Simvastatin Other (See Comments)    Reaction:  Bloating   . Triamterene Nausea And Vomiting  . Diltiazem Hcl Rash  . Lansoprazole Rash  . Pravastatin Rash  . Sulfa Antibiotics Rash  . Teriparatide Palpitations    Chief Complaint  Patient presents with  . Acute Visit        Closed fracture of multiple pubic rami left sequela:   Vascular dementia with behavioral disturbance:  Failure to thrive in adult.  Weekly follow up for the first 30 days post hospitalization. Care plan meeting.     HPI:  She is a 86 year old long term resident of this facility being seen for the management of her chronic illnesses:Closed fracture of multiple pubic rami left sequela:   Vascular dementia with behavioral disturbance:  Failure to thrive in adult.  We have come together for her care plan meeting. Family present. No BIMS; mood 0/30. She has had one fall on 2019-11-16 with a skin tear on the right lower leg; which is inflamed; with purulent drainage present. She requires extensive to total assist with  adls; is able to feed herself. Is incontinent of bladder and bowel. She is being followed by pt/ot/st. She has a poor appetite with her weight at 123 pounds. There are no reports of uncontrolled pain.    Past Medical History:  Diagnosis Date  . Anxiety   . Anxiety   . Diverticulosis large intestine w/o perforation or abscess w/bleeding   . Eye problem   . GERD (gastroesophageal reflux disease)   . GI hemorrhage   . Glaucoma   . Hypertension   . Infections of kidney   . Lower GI bleed 04/09/2016  . Osteoporosis     Past Surgical History:  Procedure Laterality Date  . brain shunt    . CATARACT EXTRACTION    . DILATION AND CURETTAGE OF UTERUS    . EYE SURGERY    . FOOT SURGERY    . HERNIA REPAIR    . THYROID SURGERY    . TONSILLECTOMY      Social History   Socioeconomic History  . Marital status: Married    Spouse name: Not on file  . Number of children: Not on file  . Years of education: Not on file  . Highest education level: Not on file  Occupational History  . Not on file  Tobacco Use  . Smoking status: Never Smoker  . Smokeless tobacco: Never Used  Vaping  Use  . Vaping Use: Never used  Substance and Sexual Activity  . Alcohol use: No  . Drug use: No  . Sexual activity: Never  Other Topics Concern  . Not on file  Social History Narrative  . Not on file   Social Determinants of Health   Financial Resource Strain:   . Difficulty of Paying Living Expenses: Not on file  Food Insecurity:   . Worried About Programme researcher, broadcasting/film/video in the Last Year: Not on file  . Ran Out of Food in the Last Year: Not on file  Transportation Needs:   . Lack of Transportation (Medical): Not on file  . Lack of Transportation (Non-Medical): Not on file  Physical Activity:   . Days of Exercise per Week: Not on file  . Minutes of Exercise per Session: Not on file  Stress:   . Feeling of Stress : Not on file  Social Connections:   . Frequency of Communication with Friends and Family:  Not on file  . Frequency of Social Gatherings with Friends and Family: Not on file  . Attends Religious Services: Not on file  . Active Member of Clubs or Organizations: Not on file  . Attends Banker Meetings: Not on file  . Marital Status: Not on file  Intimate Partner Violence:   . Fear of Current or Ex-Partner: Not on file  . Emotionally Abused: Not on file  . Physically Abused: Not on file  . Sexually Abused: Not on file   No family history on file.    VITAL SIGNS BP 139/63   Pulse (!) 56   Temp (!) 97.2 F (36.2 C)   Resp 20   Ht 5\' 4"  (1.626 m)   Wt 123 lb 6.4 oz (56 kg)   SpO2 93%   BMI 21.18 kg/m   Outpatient Encounter Medications as of 07/05/2020  Medication Sig  . acetaminophen (TYLENOL) 325 MG tablet Take 650 mg by mouth every 6 (six) hours as needed.  . cholecalciferol (VITAMIN D) 1000 UNITS tablet Take 500 Units by mouth daily.   13/12/2019 CRANBERRY PO Take 1 tablet by mouth daily. 400 mg  . Ensure (ENSURE) Take 237 mLs by mouth 4 (four) times daily.  . famotidine (PEPCID) 20 MG tablet Take 1 tablet (20 mg total) by mouth 2 (two) times daily.  . hydrALAZINE (APRESOLINE) 25 MG tablet Take 1 tablet (25 mg total) by mouth 4 (four) times daily.  . mirtazapine (REMERON) 15 MG tablet Take 15 mg by mouth at bedtime. For Insomnia and Depression  . Multiple Vitamins-Iron (MULTIVITAMINS WITH IRON) TABS tablet Take 1 tablet by mouth daily.  . ondansetron (ZOFRAN) 4 MG tablet Take 1 tablet (4 mg total) by mouth every 6 (six) hours as needed for nausea.  . OXYGEN Inhale 2 L into the lungs continuous.  Marland Kitchen Glycol-Propyl Glycol (SYSTANE ULTRA) 0.4-0.3 % SOLN Place 1 drop into both eyes 4 (four) times daily.  . potassium chloride (K-DUR) 10 MEQ tablet Take 1 tablet (10 mEq total) by mouth daily.  . sertraline (ZOLOFT) 25 MG tablet Take 25 mg by mouth daily.  Bertram Gala telmisartan (MICARDIS) 20 MG tablet Take 20 mg by mouth daily.  . timolol (TIMOPTIC) 0.5 % ophthalmic  solution Place 1 drop into both eyes daily.   Marland Kitchen UNABLE TO FIND Diet Change: Dys 2 (ground) with dys 1 (puree) meats, thin liquids (NO STRAWS)  . [DISCONTINUED] meclizine (ANTIVERT) 25 MG tablet Take 25 mg by mouth  3 (three) times daily as needed for dizziness.  . [DISCONTINUED] mirtazapine (REMERON) 7.5 MG tablet Take 7.5 mg by mouth every evening.    No facility-administered encounter medications on file as of 07/05/2020.     SIGNIFICANT DIAGNOSTIC EXAMS   PREVIOUS  06-15-20: pelvic x-ray: No hip fracture identified. Fractures through the superior and inferior pubic rami on the left.  06-15-20: ct of head and cervical spine:  1. No acute intracranial abnormality. 2. No acute displaced fracture or traumatic listhesis of the cervical spine.  06-15-20: chest x-ray: No acute cardiopulmonary disease.  NO NEW EXAMS    LABS REVIEWED TODAY;   06-16-20: wbc 10.1; hgb 12.5; hct 38.8; mcv 96.0 plt 215; glucose 146; bun 23; creat 1.06 ;k+ 3.6; na++ 140; ca 9.6 liver normal albumin 3.9; urine culture: e-coli 06-19-20: glucose 110; bun 30; creat 0.97; k+ 3.6; na++ 147; ca 8.6  06/17/2020: wbc 11.8; hgb 12.8; hct 43.2 mcv 102.9 plt 224; glucose 111; bun 24; creat 0.98; k+ 4.1; na++ 144; ca 8.6 liver normal albumin 3.2    NO NEW LABS.   Review of Systems  Unable to perform ROS: Dementia (unable to participate )     Physical Exam Constitutional:      General: She is not in acute distress.    Appearance: She is well-developed. She is not diaphoretic.     Comments: thin  Neck:     Thyroid: No thyromegaly.  Cardiovascular:     Rate and Rhythm: Normal rate and regular rhythm.     Pulses: Normal pulses.     Heart sounds: Normal heart sounds.  Pulmonary:     Effort: Pulmonary effort is normal. No respiratory distress.     Breath sounds: Normal breath sounds.  Abdominal:     General: Bowel sounds are normal. There is no distension.     Palpations: Abdomen is soft.     Tenderness: There is  no abdominal tenderness.  Musculoskeletal:        General: Normal range of motion.     Cervical back: Neck supple.     Right lower leg: No edema.     Left lower leg: No edema.     Comments: 06-15-20: Fractures through the superior and inferior pubic rami on the left.    Lymphadenopathy:     Cervical: No cervical adenopathy.  Skin:    General: Skin is warm and dry.     Comments: Right lower extremity skin tear with sutures and steri strips in place. There are is red warm with purulent drainage present.   Neurological:     Mental Status: She is alert. Mental status is at baseline.  Psychiatric:        Mood and Affect: Mood normal.      ASSESSMENT/ PLAN:  TODAY  1. Closed fracture of multiple pubic rami left sequela: is without change will continue tylenol 650 mg every 6 hours as needed   2. Vascular dementia with behavioral disturbance: is without change weight is 123 pounds; will continue to monitor  3. Failure to thrive in adult.: ablumin 3.2 will continue supplements as directed.   4. Right lower extremity cellulitis: is worse: have spoken with her family her MOST form reads for no abt; however; at this time her family would like to have abt. Will begin doxycycline 100 mg twice daily with probiotic through 07-12-20.    PREVIOUS   5. Increased intraocular pressure bilateral: is stable will continue timoptic daily to both eyes  6. Essential hypertension: is stable b/p 125/66; will continue apresoline 25 mg four times daily micardis 20 mg daily is being weaned off bystolic due to bradycardia; will need to avoid beta blockers in the future.    7. Depression due to dementia: is stable will continue zoloft 25 mg daily and remeron 15 mg nightly   8. Vascular dementia with behavioral disturbance: is without change: weight is 123 pounds; will continue to monitor her status.   9. GERD without esophagitis: is stable will continue pepcid 20 mg twice daily   10. Hypokalemia: is  stable k+ 4.1 will continue k+ 10 meq daily         MD is aware of resident's narcotic use and is in agreement with current plan of care. We will attempt to wean resident as appropriate.  Synthia Innocent NP [facility]  Contact [phone] Monday through Friday 8am- 5pm  After hours call [phone]

## 2020-07-06 ENCOUNTER — Other Ambulatory Visit: Payer: Self-pay | Admitting: Internal Medicine

## 2020-07-06 NOTE — Telephone Encounter (Signed)
Please address [facility] refill request. Thanks, Hopp

## 2020-07-08 ENCOUNTER — Telehealth: Payer: Self-pay | Admitting: Family

## 2020-07-08 NOTE — Telephone Encounter (Signed)
Facility Nurse called on call provider states patient's condition has decline and in pain.States Daughter request Morphine to be started for pain and comfort care.No Hospice service available during the weekend will follow up during the week.Patient allergic to morphine but daughter states patient was given morphine via I.V.during hospital admission and tolerated well without any reaction.Request Roxanol.Nurse advised to monitor vital signs every 15 minutes x 1 hr then change to every shift while on Roxanol 5 mg solution every 4 hours as needed for pain or shortness of breath.

## 2020-07-09 ENCOUNTER — Other Ambulatory Visit: Payer: Self-pay | Admitting: Adult Health

## 2020-07-09 ENCOUNTER — Encounter: Payer: Self-pay | Admitting: Adult Health

## 2020-07-09 ENCOUNTER — Non-Acute Institutional Stay (SKILLED_NURSING_FACILITY): Payer: Medicare PPO | Admitting: Adult Health

## 2020-07-09 DIAGNOSIS — R627 Adult failure to thrive: Secondary | ICD-10-CM | POA: Insufficient documentation

## 2020-07-09 DIAGNOSIS — S32592S Other specified fracture of left pubis, sequela: Secondary | ICD-10-CM

## 2020-07-09 DIAGNOSIS — F01518 Vascular dementia, unspecified severity, with other behavioral disturbance: Secondary | ICD-10-CM

## 2020-07-09 DIAGNOSIS — F0151 Vascular dementia with behavioral disturbance: Secondary | ICD-10-CM

## 2020-07-09 DIAGNOSIS — L03115 Cellulitis of right lower limb: Secondary | ICD-10-CM | POA: Insufficient documentation

## 2020-07-09 MED ORDER — MORPHINE SULFATE (CONCENTRATE) 20 MG/ML PO SOLN
5.0000 mg | Freq: Four times a day (QID) | ORAL | 0 refills | Status: DC
Start: 2020-07-09 — End: 2020-07-16

## 2020-07-09 NOTE — Progress Notes (Signed)
Location:    [facility] Room Number: 133/P Place of Service:  SNF (31)   CODE STATUS: DNR  Allergies  Allergen Reactions   Alendronate Other (See Comments)    Alendronic Acid Per MAR Reaction:  Cramping    Butazolidin [Phenylbutazone] Swelling   Codeine Nausea And Vomiting   Darvocet [Propoxyphene N-Acetaminophen] Nausea And Vomiting   Ezetimibe-Simvastatin Other (See Comments)    Reaction:  Numbness    Meperidine Nausea And Vomiting   Morphine And Related Other (See Comments)    Reaction:  Unknown    Niacin Nausea And Vomiting and Other (See Comments)    Reaction:  Bloating    Oxybutynin Chloride Other (See Comments)    Reaction:  Confusion    Prednisone Other (See Comments)    Reaction:  Confusion    Propoxyphene Nausea And Vomiting   Raloxifene Other (See Comments)    Reaction:  Cramping    Risedronate Other (See Comments)    Reaction:  Dizziness    Simvastatin Other (See Comments)    Reaction:  Bloating    Triamterene Nausea And Vomiting   Diltiazem Hcl Rash   Lansoprazole Rash   Pravastatin Rash   Sulfa Antibiotics Rash   Teriparatide Palpitations    Chief Complaint  Patient presents with   Acute Visit    End of Life Visit    HPI:  She continues to decline. She is taking in very little po. She has been restless; crying and yelling out. She has a history of dementia; recent pubic rami fracture and failure to thrive in adult. Her family desires only for comfort care. They want her medications stopped. They would like her to have morphine to help with her distress and any pain that she may have.   Past Medical History:  Diagnosis Date   Anxiety    Anxiety    Diverticulosis large intestine w/o perforation or abscess w/bleeding    Eye problem    GERD (gastroesophageal reflux disease)    GI hemorrhage    Glaucoma    Hypertension    Infections of kidney    Lower GI bleed 04/09/2016   Osteoporosis      Past Surgical History:  Procedure Laterality Date   brain shunt     CATARACT EXTRACTION     DILATION AND CURETTAGE OF UTERUS     EYE SURGERY     FOOT SURGERY     HERNIA REPAIR     THYROID SURGERY     TONSILLECTOMY      Social History   Socioeconomic History   Marital status: Married    Spouse name: Not on file   Number of children: Not on file   Years of education: Not on file   Highest education level: Not on file  Occupational History   Not on file  Tobacco Use   Smoking status: Never Smoker   Smokeless tobacco: Never Used  Vaping Use   Vaping Use: Never used  Substance and Sexual Activity   Alcohol use: No   Drug use: No   Sexual activity: Never  Other Topics Concern   Not on file  Social History Narrative   Not on file   Social Determinants of Health   Financial Resource Strain:    Difficulty of Paying Living Expenses: Not on file  Food Insecurity:    Worried About Running Out of Food in the Last Year: Not on file   [employer] of Food in the  Last Year: Not on file  Transportation Needs:    Lack of Transportation (Medical): Not on file   Lack of Transportation (Non-Medical): Not on file  Physical Activity:    Days of Exercise per Week: Not on file   Minutes of Exercise per Session: Not on file  Stress:    Feeling of Stress : Not on file  Social Connections:    Frequency of Communication with Friends and Family: Not on file   Frequency of Social Gatherings with Friends and Family: Not on file   Attends Religious Services: Not on file   Active Member of Clubs or Organizations: Not on file   Attends Banker Meetings: Not on file   Marital Status: Not on file  Intimate Partner Violence:    Fear of Current or Ex-Partner: Not on file   Emotionally Abused: Not on file   Physically Abused: Not on file   Sexually Abused: Not on file   No family history on file.    VITAL SIGNS BP 137/70    Pulse (!)  56    Temp (!) 97.4 F (36.3 C)    Resp 18    Ht 5\' 4"  (1.626 m)    Wt 121 lb (54.9 kg)    SpO2 92%    BMI 20.77 kg/m   Outpatient Encounter Medications as of 07/09/2020  Medication Sig   Ensure (ENSURE) Take 237 mLs by mouth 4 (four) times daily.   morphine (ROXANOL) 20 MG/ML concentrated solution Take 5 mg by mouth every 2 (two) hours as needed for breakthrough pain. Special Instructions: for breakthrough pain or distress   morphine (ROXANOL) 20 MG/ML concentrated solution Take 5 mg by mouth every 6 (six) hours. Special Instructions: for pain and comfort   ondansetron (ZOFRAN) 4 MG tablet Take 1 tablet (4 mg total) by mouth every 6 (six) hours as needed for nausea.   OXYGEN Inhale 2 L into the lungs continuous.   Polyethyl Glycol-Propyl Glycol (SYSTANE ULTRA) 0.4-0.3 % SOLN Place 1 drop into both eyes 4 (four) times daily.   timolol (TIMOPTIC) 0.5 % ophthalmic solution Place 1 drop into both eyes daily.    UNABLE TO FIND Diet Change: Dys 2 (ground) with dys 1 (puree) meats, honey thick liquids (NO STRAWS)   [DISCONTINUED] acetaminophen (TYLENOL) 325 MG tablet Take 650 mg by mouth every 6 (six) hours as needed.   [DISCONTINUED] cholecalciferol (VITAMIN D) 1000 UNITS tablet Take 500 Units by mouth daily.    [DISCONTINUED] CRANBERRY PO Take 1 tablet by mouth daily. 400 mg   [DISCONTINUED] famotidine (PEPCID) 20 MG tablet Take 1 tablet (20 mg total) by mouth 2 (two) times daily.   [DISCONTINUED] hydrALAZINE (APRESOLINE) 25 MG tablet Take 1 tablet (25 mg total) by mouth 4 (four) times daily.   [DISCONTINUED] mirtazapine (REMERON) 15 MG tablet Take 15 mg by mouth at bedtime. For Insomnia and Depression   [DISCONTINUED] Multiple Vitamins-Iron (MULTIVITAMINS WITH IRON) TABS tablet Take 1 tablet by mouth daily.   [DISCONTINUED] potassium chloride (K-DUR) 10 MEQ tablet Take 1 tablet (10 mEq total) by mouth daily.   [DISCONTINUED] sertraline (ZOLOFT) 25 MG tablet Take 25 mg by mouth  daily.   [DISCONTINUED] telmisartan (MICARDIS) 20 MG tablet Take 20 mg by mouth daily.   No facility-administered encounter medications on file as of 07/09/2020.     SIGNIFICANT DIAGNOSTIC EXAMS   PREVIOUS  06-15-20: pelvic x-ray: No hip fracture identified. Fractures through the superior and inferior pubic rami on the  left.  06-15-20: ct of head and cervical spine:  1. No acute intracranial abnormality. 2. No acute displaced fracture or traumatic listhesis of the cervical spine.  06-15-20: chest x-ray: No acute cardiopulmonary disease.  NO NEW EXAMS    LABS REVIEWED TODAY;   06-16-20: wbc 10.1; hgb 12.5; hct 38.8; mcv 96.0 plt 215; glucose 146; bun 23; creat 1.06 ;k+ 3.6; na++ 140; ca 9.6 liver normal albumin 3.9; urine culture: e-coli 06-19-20: glucose 110; bun 30; creat 0.97; k+ 3.6; na++ 147; ca 8.6  07/05/20: wbc 11.8; hgb 12.8; hct 43.2 mcv 102.9 plt 224; glucose 111; bun 24; creat 0.98; k+ 4.1; na++ 144; ca 8.6 liver normal albumin 3.2    NO NEW LABS.   Review of Systems  Unable to perform ROS: Dementia (unaware of surroundings )    Physical Exam Constitutional:      General: She is not in acute distress.    Appearance: She is well-developed. She is not diaphoretic.  Neck:     Thyroid: No thyromegaly.  Cardiovascular:     Rate and Rhythm: Normal rate and regular rhythm.     Heart sounds: Normal heart sounds.  Pulmonary:     Effort: Pulmonary effort is normal. No respiratory distress.     Breath sounds: Normal breath sounds.  Abdominal:     General: Bowel sounds are normal. There is no distension.     Palpations: Abdomen is soft.     Tenderness: There is no abdominal tenderness.  Musculoskeletal:     Cervical back: Neck supple.     Right lower leg: No edema.     Left lower leg: No edema.     Comments: 06-15-20: Fractures through the superior and inferior pubic rami on the left.     Lymphadenopathy:     Cervical: No cervical adenopathy.  Skin:     General: Skin is warm and dry.  Psychiatric:     Comments: Not aware of surroundings         ASSESSMENT/ PLAN:  TODAY  1. Failure to thrive in adult 2. Vascular dementia without behavioral disturbance  3. Closed fracture of multiple pubic rami left sequela   Will stop all medications except roxanol Will change to: roxanol 5 mg every 6 hours and 5 mg every 2 hours as needed Will continue to focus upon her comfort Will monitor her status; which is terminal   MD is aware of resident's narcotic use and is in agreement with current plan of care. We will attempt to wean resident as appropriate.  Synthia Innocent NP [facility]  Contact [phone] Monday through Friday 8am- 5pm  After hours call [phone]

## 2020-07-12 ENCOUNTER — Encounter: Payer: Self-pay | Admitting: Adult Health

## 2020-07-12 ENCOUNTER — Non-Acute Institutional Stay (SKILLED_NURSING_FACILITY): Payer: Medicare Other | Admitting: Adult Health

## 2020-07-12 DIAGNOSIS — F0151 Vascular dementia with behavioral disturbance: Secondary | ICD-10-CM

## 2020-07-12 DIAGNOSIS — S32592S Other specified fracture of left pubis, sequela: Secondary | ICD-10-CM | POA: Diagnosis not present

## 2020-07-12 DIAGNOSIS — R627 Adult failure to thrive: Secondary | ICD-10-CM | POA: Diagnosis not present

## 2020-07-12 DIAGNOSIS — F01518 Vascular dementia, unspecified severity, with other behavioral disturbance: Secondary | ICD-10-CM

## 2020-07-12 NOTE — Progress Notes (Signed)
Location:    [facility] Room Number: 133/P Place of Service:  SNF (31)   CODE STATUS: DNR  Allergies  Allergen Reactions   Alendronate Other (See Comments)    Alendronic Acid Per MAR Reaction:  Cramping    Butazolidin [Phenylbutazone] Swelling   Codeine Nausea And Vomiting   Darvocet [Propoxyphene N-Acetaminophen] Nausea And Vomiting   Diltiazem    Ezetimibe-Simvastatin Other (See Comments)    Reaction:  Numbness    Hydrochlorothiazide    Meperidine Nausea And Vomiting   Meperidine Hcl    Mixed Grasses    Morphine And Related Other (See Comments)    Reaction:  Unknown    Niacin Nausea And Vomiting and Other (See Comments)    Reaction:  Bloating    Oxybutynin    Oxybutynin Chloride Other (See Comments)    Reaction:  Confusion    Prednisone Other (See Comments)    Reaction:  Confusion    Propoxyphene Nausea And Vomiting   Raloxifene Other (See Comments)    Reaction:  Cramping    Risedronate Other (See Comments)    Reaction:  Dizziness    Simvastatin Other (See Comments)    Reaction:  Bloating    Triamterene Nausea And Vomiting   Diltiazem Hcl Rash   Lansoprazole Rash   Pravastatin Rash   Sulfa Antibiotics Rash   Teriparatide Palpitations    Chief Complaint  Patient presents with   Short Term Rehab (STR)          Vascular dementia with behavioral disturbance Closed fracture of multiple pubic rami sequela  Failure to thrive in adult   Weekly follow up for the first 30 days post hospitalization.     HPI:  She is a 86 year old long term resident of this facility being seen for the management of her chronic illnesses:  Vascular dementia with behavioral disturbance Closed fracture of multiple pubic rami sequela  Failure to thrive in adult. She continues to be followed by hospice care. The focus of her care is for comfort only. She is on routine morphine with as needed as well. There are no indications that she is having  pain; no signs of restlessness; no reports of constipation.   Past Medical History:  Diagnosis Date   Anxiety    Anxiety    Diverticulosis large intestine w/o perforation or abscess w/bleeding    Eye problem    GERD (gastroesophageal reflux disease)    GI hemorrhage    Glaucoma    Hypertension    Infections of kidney    Lower GI bleed 04/09/2016   Osteoporosis     Past Surgical History:  Procedure Laterality Date   brain shunt     CATARACT EXTRACTION     DILATION AND CURETTAGE OF UTERUS     EYE SURGERY     FOOT SURGERY     HERNIA REPAIR     THYROID SURGERY     TONSILLECTOMY      Social History   Socioeconomic History   Marital status: Married    Spouse name: Not on file   Number of children: Not on file   Years of education: Not on file   Highest education level: Not on file  Occupational History   Not on file  Tobacco Use   Smoking status: Never Smoker   Smokeless tobacco: Never Used  Vaping Use   Vaping Use: Never used  Substance and Sexual Activity   Alcohol use: No  Drug use: No   Sexual activity: Never  Other Topics Concern   Not on file  Social History Narrative   Not on file   Social Determinants of Health   Financial Resource Strain:    Difficulty of Paying Living Expenses: Not on file  Food Insecurity:    Worried About Running Out of Food in the Last Year: Not on file   Ran Out of Food in the Last Year: Not on file  Transportation Needs:    Lack of Transportation (Medical): Not on file   Lack of Transportation (Non-Medical): Not on file  Physical Activity:    Days of Exercise per Week: Not on file   Minutes of Exercise per Session: Not on file  Stress:    Feeling of Stress : Not on file  Social Connections:    Frequency of Communication with Friends and Family: Not on file   Frequency of Social Gatherings with Friends and Family: Not on file   Attends Religious Services: Not on file   Active  Member of Clubs or Organizations: Not on file   Attends Banker Meetings: Not on file   Marital Status: Not on file  Intimate Partner Violence:    Fear of Current or Ex-Partner: Not on file   Emotionally Abused: Not on file   Physically Abused: Not on file   Sexually Abused: Not on file   History reviewed. No pertinent family history.    VITAL SIGNS BP 101/62    Pulse 78    Temp 97.8 F (36.6 C)    Ht 5\' 4"  (1.626 m)    Wt 123 lb 6.4 oz (56 kg)    BMI 21.18 kg/m   Outpatient Encounter Medications as of 07/12/2020  Medication Sig   Balsam [place]-Castor Oil (VENELEX) OINT Apply topically. Special Instructions: Apply to bilateral buttocks, sacrum and coccyx qshift for prevention.   Ensure (ENSURE) Take 237 mLs by mouth 4 (four) times daily.   morphine (ROXANOL) 20 MG/ML concentrated solution Take 0.25 mLs (5 mg total) by mouth every 6 (six) hours. And every 2 hours as needed for pain or distress   ondansetron (ZOFRAN) 4 MG tablet Take 1 tablet (4 mg total) by mouth every 6 (six) hours as needed for nausea.   OXYGEN Inhale 2 L into the lungs continuous.   Polyethyl Glycol-Propyl Glycol (SYSTANE ULTRA) 0.4-0.3 % SOLN Place 1 drop into both eyes 4 (four) times daily.   timolol (TIMOPTIC) 0.5 % ophthalmic solution Place 1 drop into both eyes daily.    UNABLE TO FIND Diet Change: Dys 2 (ground) with dys 1 (puree) meats, honey thick liquids (NO STRAWS)   [DISCONTINUED] morphine (ROXANOL) 20 MG/ML concentrated solution Take 5 mg by mouth every 2 (two) hours as needed for breakthrough pain. Special Instructions: for breakthrough pain or distress   No facility-administered encounter medications on file as of 07/12/2020.     SIGNIFICANT DIAGNOSTIC EXAMS  PREVIOUS  06-15-20: pelvic x-ray: No hip fracture identified. Fractures through the superior and inferior pubic rami on the left.  06-15-20: ct of head and cervical spine:  1. No acute intracranial  abnormality. 2. No acute displaced fracture or traumatic listhesis of the cervical spine.  06-15-20: chest x-ray: No acute cardiopulmonary disease.  NO NEW EXAMS    LABS REVIEWED TODAY;   06-16-20: wbc 10.1; hgb 12.5; hct 38.8; mcv 96.0 plt 215; glucose 146; bun 23; creat 1.06 ;k+ 3.6; na++ 140; ca 9.6 liver normal albumin 3.9; urine  culture: e-coli 06-19-20: glucose 110; bun 30; creat 0.97; k+ 3.6; na++ 147; ca 8.6  06/14/2020: wbc 11.8; hgb 12.8; hct 43.2 mcv 102.9 plt 224; glucose 111; bun 24; creat 0.98; k+ 4.1; na++ 144; ca 8.6 liver normal albumin 3.2    NO NEW LABS.   Review of Systems  Unable to perform ROS: Dementia (unable to participate )    Physical Exam Constitutional:      General: She is not in acute distress.    Appearance: She is well-developed. She is not diaphoretic.  Neck:     Thyroid: No thyromegaly.  Cardiovascular:     Rate and Rhythm: Normal rate and regular rhythm.     Pulses: Normal pulses.     Heart sounds: Normal heart sounds.  Pulmonary:     Effort: Pulmonary effort is normal. No respiratory distress.     Breath sounds: Normal breath sounds.  Abdominal:     General: Bowel sounds are normal. There is no distension.     Palpations: Abdomen is soft.     Tenderness: There is no abdominal tenderness.  Musculoskeletal:     Cervical back: Neck supple.     Right lower leg: No edema.     Left lower leg: No edema.     Comments: 06-15-20: Fractures through the superior and inferior pubic rami on the left.  Lymphadenopathy:     Cervical: No cervical adenopathy.  Skin:    General: Skin is warm and dry.  Neurological:     Comments: Unaware        ASSESSMENT/ PLAN:  TODAY  1. Vascular dementia with behavioral disturbance 2. Closed fracture of multiple pubic rami sequela 3. Failure to thrive in adult   Will continue current medications Will continue current plan of care Will continue to focus her care on comfort   MD is aware of resident's  narcotic use and is in agreement with current plan of care. We will attempt to wean resident as appropriate.  Synthia Innocent NP [facility]  Contact [phone] Monday through Friday 8am- 5pm  After hours call [phone]

## 2020-07-16 ENCOUNTER — Encounter: Payer: Self-pay | Admitting: Adult Health

## 2020-07-16 ENCOUNTER — Non-Acute Institutional Stay (SKILLED_NURSING_FACILITY): Payer: Medicare Other | Admitting: Adult Health

## 2020-07-16 ENCOUNTER — Other Ambulatory Visit: Payer: Self-pay | Admitting: Adult Health

## 2020-07-16 DIAGNOSIS — F0151 Vascular dementia with behavioral disturbance: Secondary | ICD-10-CM | POA: Diagnosis not present

## 2020-07-16 DIAGNOSIS — R627 Adult failure to thrive: Secondary | ICD-10-CM

## 2020-07-16 DIAGNOSIS — F01518 Vascular dementia, unspecified severity, with other behavioral disturbance: Secondary | ICD-10-CM

## 2020-07-16 MED ORDER — LORAZEPAM 2 MG/ML PO CONC: 0.2500 mg | Freq: Three times a day (TID) | ORAL | 0 refills | Status: DC

## 2020-07-16 NOTE — Progress Notes (Signed)
Location:    [facility] Room Number: 133/P Place of Service:  SNF (31)   CODE STATUS: DNR  Allergies  Allergen Reactions  . Alendronate Other (See Comments)    Alendronic Acid Per MAR Reaction:  Cramping   . Butazolidin [Phenylbutazone] Swelling  . Codeine Nausea And Vomiting  . Darvocet [Propoxyphene N-Acetaminophen] Nausea And Vomiting  . Diltiazem   . Ezetimibe-Simvastatin Other (See Comments)    Reaction:  Numbness   . Hydrochlorothiazide   . Meperidine Nausea And Vomiting  . Meperidine Hcl   . Mixed Grasses   . Morphine And Related Other (See Comments)    Reaction:  Unknown   . Niacin Nausea And Vomiting and Other (See Comments)    Reaction:  Bloating   . Oxybutynin   . Oxybutynin Chloride Other (See Comments)    Reaction:  Confusion   . Prednisone Other (See Comments)    Reaction:  Confusion   . Propoxyphene Nausea And Vomiting  . Raloxifene Other (See Comments)    Reaction:  Cramping   . Risedronate Other (See Comments)    Reaction:  Dizziness   . Simvastatin Other (See Comments)    Reaction:  Bloating   . Triamterene Nausea And Vomiting  . Diltiazem Hcl Rash  . Lansoprazole Rash  . Pravastatin Rash  . Sulfa Antibiotics Rash  . Teriparatide Palpitations    Chief Complaint  Patient presents with  . Acute Visit    Comfort Measures    HPI:  She is presently taking roxanol 5 mg every 6 hours and every 2 hours as needed. Despite this she remains uncomfortable. She is restless; is picking at herself and her clothes. The focus of her care is for comfort. She continues to be followed by hospice care.    Past Medical History:  Diagnosis Date  . Anxiety   . Anxiety   . Diverticulosis large intestine w/o perforation or abscess w/bleeding   . Eye problem   . GERD (gastroesophageal reflux disease)   . GI hemorrhage   . Glaucoma   . Hypertension   . Infections of kidney   . Lower GI bleed 04/09/2016  . Osteoporosis     Past  Surgical History:  Procedure Laterality Date  . brain shunt    . CATARACT EXTRACTION    . DILATION AND CURETTAGE OF UTERUS    . EYE SURGERY    . FOOT SURGERY    . HERNIA REPAIR    . THYROID SURGERY    . TONSILLECTOMY      Social History   Socioeconomic History  . Marital status: Married    Spouse name: Not on file  . Number of children: Not on file  . Years of education: Not on file  . Highest education level: Not on file  Occupational History  . Not on file  Tobacco Use  . Smoking status: Never Smoker  . Smokeless tobacco: Never Used  Vaping Use  . Vaping Use: Never used  Substance and Sexual Activity  . Alcohol use: No  . Drug use: No  . Sexual activity: Never  Other Topics Concern  . Not on file  Social History Narrative  . Not on file   Social Determinants of Health   Financial Resource Strain:   . Difficulty of Paying Living Expenses: Not on file  Food Insecurity:   . Worried About Programme researcher, broadcasting/film/video in the Last Year: Not on file  . Ran Out of Food  in the Last Year: Not on file  Transportation Needs:   . Lack of Transportation (Medical): Not on file  . Lack of Transportation (Non-Medical): Not on file  Physical Activity:   . Days of Exercise per Week: Not on file  . Minutes of Exercise per Session: Not on file  Stress:   . Feeling of Stress : Not on file  Social Connections:   . Frequency of Communication with Friends and Family: Not on file  . Frequency of Social Gatherings with Friends and Family: Not on file  . Attends Religious Services: Not on file  . Active Member of Clubs or Organizations: Not on file  . Attends Banker Meetings: Not on file  . Marital Status: Not on file  Intimate Partner Violence:   . Fear of Current or Ex-Partner: Not on file  . Emotionally Abused: Not on file  . Physically Abused: Not on file  . Sexually Abused: Not on file   History reviewed. No pertinent family history.    VITAL SIGNS BP 101/62    Pulse (!) 58   Temp (!) 97.2 F (36.2 C)   Resp 20   Ht 5\' 4"  (1.626 m)   Wt 123 lb 6.4 oz (56 kg)   SpO2 90%   BMI 21.18 kg/m   Outpatient Encounter Medications as of 07/16/2020  Medication Sig  . Balsam [place]-Castor Oil (VENELEX) OINT Apply topically. Special Instructions: Apply to bilateral buttocks, sacrum and coccyx qshift for prevention.  . Ensure (ENSURE) Take 237 mLs by mouth 4 (four) times daily.  07/18/2020 LORazepam (ATIVAN) 2 MG/ML concentrated solution Take 0.25 mg by mouth every 8 (eight) hours. Special Instructions: for agitation and restlessness  . morphine (ROXANOL) 20 MG/ML concentrated solution Take 5 mg by mouth every 2 (two) hours as needed for severe pain. Special Instructions: for breakthrough pain or distress  . morphine (ROXANOL) 20 MG/ML concentrated solution Take 5 mg by mouth every 6 (six) hours. Special Instructions: for pain and comfort  . ondansetron (ZOFRAN) 4 MG tablet Take 1 tablet (4 mg total) by mouth every 6 (six) hours as needed for nausea.  . OXYGEN Inhale 2 L into the lungs continuous.  Marland Kitchen Glycol-Propyl Glycol (SYSTANE ULTRA) 0.4-0.3 % SOLN Place 1 drop into both eyes 4 (four) times daily.  . timolol (TIMOPTIC) 0.5 % ophthalmic solution Place 1 drop into both eyes daily.   Bertram Gala UNABLE TO FIND Diet Change: Dys 2 (ground) with dys 1 (puree) meats, honey thick liquids (NO STRAWS)  . [DISCONTINUED] LORazepam (LORAZEPAM INTENSOL) 2 MG/ML concentrated solution Take 0.1 mLs (0.2 mg total) by mouth every 8 (eight) hours. The order is to read 0.25 mg every 8 hours routinely  . [DISCONTINUED] morphine (ROXANOL) 20 MG/ML concentrated solution Take 0.25 mLs (5 mg total) by mouth every 6 (six) hours. And every 2 hours as needed for pain or distress   No facility-administered encounter medications on file as of 07/16/2020.     SIGNIFICANT DIAGNOSTIC EXAMS   PREVIOUS  06-15-20: pelvic x-ray: No hip fracture identified. Fractures through the superior and inferior  pubic rami on the left.  06-15-20: ct of head and cervical spine:  1. No acute intracranial abnormality. 2. No acute displaced fracture or traumatic listhesis of the cervical spine.  06-15-20: chest x-ray: No acute cardiopulmonary disease.  NO NEW EXAMS    LABS REVIEWED TODAY;   06-16-20: wbc 10.1; hgb 12.5; hct 38.8; mcv 96.0 plt 215; glucose 146; bun 23; creat  1.06 ;k+ 3.6; na++ 140; ca 9.6 liver normal albumin 3.9; urine culture: e-coli 06-19-20: glucose 110; bun 30; creat 0.97; k+ 3.6; na++ 147; ca 8.6  06/13/2020: wbc 11.8; hgb 12.8; hct 43.2 mcv 102.9 plt 224; glucose 111; bun 24; creat 0.98; k+ 4.1; na++ 144; ca 8.6 liver normal albumin 3.2    NO NEW LABS.    Review of Systems  Unable to perform ROS: Dementia (unable to participate )    Physical Exam Constitutional:      General: She is not in acute distress.    Appearance: She is well-developed. She is not diaphoretic.  Neck:     Thyroid: No thyromegaly.  Cardiovascular:     Rate and Rhythm: Normal rate and regular rhythm.     Pulses: Normal pulses.     Heart sounds: Normal heart sounds.  Pulmonary:     Effort: Pulmonary effort is normal. No respiratory distress.     Breath sounds: Normal breath sounds.  Abdominal:     General: Bowel sounds are normal. There is no distension.     Palpations: Abdomen is soft.     Tenderness: There is no abdominal tenderness.  Musculoskeletal:     Cervical back: Neck supple.     Right lower leg: No edema.     Left lower leg: No edema.     Comments: 06-15-20: Fractures through the superior and inferior pubic rami on the left.  Lymphadenopathy:     Cervical: No cervical adenopathy.  Skin:    General: Skin is warm and dry.  Neurological:     Comments: Not alert       ASSESSMENT/ PLAN:   1. Vascular dementia with behavioral disturbance 2. Failure to thrive in adult  She is not comfortable.  Will continue roxanol 5 mg every 6 hours and every 2 hours as needed Will begin  ativan 0.25 mg every 8 hours  Will continue to monitor her status.     MD is aware of resident's narcotic use and is in agreement with current plan of care. We will attempt to wean resident as appropriate.  Synthia Innocent NP [facility]  Contact [phone] Monday through Friday 8am- 5pm  After hours call [phone]

## 2020-07-19 ENCOUNTER — Encounter: Payer: Self-pay | Admitting: Adult Health

## 2020-07-19 ENCOUNTER — Non-Acute Institutional Stay (SKILLED_NURSING_FACILITY): Payer: Medicare Other | Admitting: Adult Health

## 2020-07-19 DIAGNOSIS — S32592S Other specified fracture of left pubis, sequela: Secondary | ICD-10-CM | POA: Diagnosis not present

## 2020-07-19 DIAGNOSIS — F0151 Vascular dementia with behavioral disturbance: Secondary | ICD-10-CM

## 2020-07-19 DIAGNOSIS — F01518 Vascular dementia, unspecified severity, with other behavioral disturbance: Secondary | ICD-10-CM

## 2020-07-19 DIAGNOSIS — R627 Adult failure to thrive: Secondary | ICD-10-CM

## 2020-07-19 NOTE — Progress Notes (Signed)
Location:    [facility] Room Number: 144/P Place of Service:  SNF (31)   CODE STATUS: DNR  Allergies  Allergen Reactions  . Alendronate Other (See Comments)    Alendronic Acid Per MAR Reaction:  Cramping   . Butazolidin [Phenylbutazone] Swelling  . Codeine Nausea And Vomiting  . Darvocet [Propoxyphene N-Acetaminophen] Nausea And Vomiting  . Diltiazem   . Ezetimibe-Simvastatin Other (See Comments)    Reaction:  Numbness   . Hydrochlorothiazide   . Meperidine Nausea And Vomiting  . Meperidine Hcl   . Mixed Grasses   . Morphine And Related Other (See Comments)    Reaction:  Unknown   . Niacin Nausea And Vomiting and Other (See Comments)    Reaction:  Bloating   . Oxybutynin   . Oxybutynin Chloride Other (See Comments)    Reaction:  Confusion   . Prednisone Other (See Comments)    Reaction:  Confusion   . Propoxyphene Nausea And Vomiting  . Raloxifene Other (See Comments)    Reaction:  Cramping   . Risedronate Other (See Comments)    Reaction:  Dizziness   . Simvastatin Other (See Comments)    Reaction:  Bloating   . Triamterene Nausea And Vomiting  . Diltiazem Hcl Rash  . Lansoprazole Rash  . Pravastatin Rash  . Sulfa Antibiotics Rash  . Teriparatide Palpitations    Chief Complaint  Patient presents with  . Medical Management of Chronic Issues           Vascular dementia with behavioral disturbance  Closed fracture of multiple pubic rami sequela  Failure to thrive in adult.    HPI:  She is a 86 year old long term resident of this facility being seen for the management of her chronic illnesses; Vascular dementia with behavioral disturbance  Closed fracture of multiple pubic rami sequela  Failure to thrive in adult.. she continues to be followed by hospice care. There are no reports of itching; pain or agitation. The focus of her care remains with comfort only.   Past Medical History:  Diagnosis Date  . Anxiety   . Anxiety   .  Diverticulosis large intestine w/o perforation or abscess w/bleeding   . Eye problem   . GERD (gastroesophageal reflux disease)   . GI hemorrhage   . Glaucoma   . Hypertension   . Infections of kidney   . Lower GI bleed 04/09/2016  . Osteoporosis     Past Surgical History:  Procedure Laterality Date  . brain shunt    . CATARACT EXTRACTION    . DILATION AND CURETTAGE OF UTERUS    . EYE SURGERY    . FOOT SURGERY    . HERNIA REPAIR    . THYROID SURGERY    . TONSILLECTOMY      Social History   Socioeconomic History  . Marital status: Married    Spouse name: Not on file  . Number of children: Not on file  . Years of education: Not on file  . Highest education level: Not on file  Occupational History  . Not on file  Tobacco Use  . Smoking status: Never Smoker  . Smokeless tobacco: Never Used  Vaping Use  . Vaping Use: Never used  Substance and Sexual Activity  . Alcohol use: No  . Drug use: No  . Sexual activity: Never  Other Topics Concern  . Not on file  Social History Narrative  . Not on file  Social Determinants of Health   Financial Resource Strain:   . Difficulty of Paying Living Expenses: Not on file  Food Insecurity:   . Worried About Programme researcher, broadcasting/film/video in the Last Year: Not on file  . Ran Out of Food in the Last Year: Not on file  Transportation Needs:   . Lack of Transportation (Medical): Not on file  . Lack of Transportation (Non-Medical): Not on file  Physical Activity:   . Days of Exercise per Week: Not on file  . Minutes of Exercise per Session: Not on file  Stress:   . Feeling of Stress : Not on file  Social Connections:   . Frequency of Communication with Friends and Family: Not on file  . Frequency of Social Gatherings with Friends and Family: Not on file  . Attends Religious Services: Not on file  . Active Member of Clubs or Organizations: Not on file  . Attends Banker Meetings: Not on file  . Marital Status: Not on file    Intimate Partner Violence:   . Fear of Current or Ex-Partner: Not on file  . Emotionally Abused: Not on file  . Physically Abused: Not on file  . Sexually Abused: Not on file   History reviewed. No pertinent family history.    VITAL SIGNS BP 101/62   Pulse 71   Temp (!) 97.2 F (36.2 C)   Resp 20   Ht 5\' 4"  (1.626 m)   Wt 123 lb 6.4 oz (56 kg)   SpO2 93%   BMI 21.18 kg/m   Outpatient Encounter Medications as of 07/19/2020  Medication Sig  . Balsam [place]-Castor Oil (VENELEX) OINT Apply topically. Special Instructions: Apply to bilateral buttocks, sacrum and coccyx qshift for prevention.  . Ensure (ENSURE) Take 237 mLs by mouth 4 (four) times daily.  07/22/2020 LORazepam (ATIVAN) 2 MG/ML concentrated solution Take 0.25 mg by mouth every 8 (eight) hours. Special Instructions: for agitation and restlessness  . morphine (ROXANOL) 20 MG/ML concentrated solution Take 5 mg by mouth every 2 (two) hours as needed for severe pain. Special Instructions: for breakthrough pain or distress  . morphine (ROXANOL) 20 MG/ML concentrated solution Take 5 mg by mouth every 6 (six) hours. Special Instructions: for pain and comfort  . ondansetron (ZOFRAN) 4 MG tablet Take 1 tablet (4 mg total) by mouth every 6 (six) hours as needed for nausea.  . OXYGEN Inhale 2 L into the lungs continuous.  Marland Kitchen Glycol-Propyl Glycol (SYSTANE ULTRA) 0.4-0.3 % SOLN Place 1 drop into both eyes 4 (four) times daily.  . timolol (TIMOPTIC) 0.5 % ophthalmic solution Place 1 drop into both eyes daily.   Bertram Gala UNABLE TO FIND Diet Change: Dys 2 (ground) with dys 1 (puree) meats, honey thick liquids (NO STRAWS)   No facility-administered encounter medications on file as of 07/19/2020.     SIGNIFICANT DIAGNOSTIC EXAMS   PREVIOUS  06-15-20: pelvic x-ray: No hip fracture identified. Fractures through the superior and inferior pubic rami on the left.  06-15-20: ct of head and cervical spine:  1. No acute intracranial  abnormality. 2. No acute displaced fracture or traumatic listhesis of the cervical spine.  06-15-20: chest x-ray: No acute cardiopulmonary disease.  NO NEW EXAMS    LABS REVIEWED TODAY;   06-16-20: wbc 10.1; hgb 12.5; hct 38.8; mcv 96.0 plt 215; glucose 146; bun 23; creat 1.06 ;k+ 3.6; na++ 140; ca 9.6 liver normal albumin 3.9; urine culture: e-coli 06-19-20: glucose 110; bun 30;  creat 0.97; k+ 3.6; na++ 147; ca 8.6  06-29-20: wbc 11.8; hgb 12.8; hct 43.2 mcv 102.9 plt 224; glucose 111; bun 24; creat 0.98; k+ 4.1; na++ 144; ca 8.6 liver normal albumin 3.2    NO NEW LABS.   Review of Systems  Unable to perform ROS: Dementia (unable to participate )    Physical Exam Constitutional:      General: She is not in acute distress.    Appearance: She is well-developed. She is not diaphoretic.  Neck:     Thyroid: No thyromegaly.  Cardiovascular:     Rate and Rhythm: Normal rate and regular rhythm.     Pulses: Normal pulses.     Heart sounds: Normal heart sounds.  Pulmonary:     Effort: Pulmonary effort is normal. No respiratory distress.     Breath sounds: Normal breath sounds.  Abdominal:     General: Bowel sounds are normal. There is no distension.     Palpations: Abdomen is soft.     Tenderness: There is no abdominal tenderness.  Musculoskeletal:     Cervical back: Neck supple.     Right lower leg: No edema.     Left lower leg: No edema.     Comments: 06-15-20: Fractures through the superior and inferior pubic rami on the left.   Lymphadenopathy:     Cervical: No cervical adenopathy.  Skin:    General: Skin is warm and dry.  Neurological:     Comments: Is aware      ASSESSMENT/ PLAN:  TODAY  1. Vascular dementia with behavioral disturbance 2. Closed fracture of multiple pubic rami sequela 3. Failure to thrive in adult.   Will continue current medications Will continue current plan of care Continues to be followed by hospice care The focus of her care is comfort.      MD is aware of resident's narcotic use and is in agreement with current plan of care. We will attempt to wean resident as appropriate.  Synthia Innocent NP [facility]  Contact [phone] Monday through Friday 8am- 5pm  After hours call [phone]

## 2020-08-01 DEATH — deceased
